# Patient Record
Sex: Female | Born: 1980 | Race: White | Hispanic: No | Smoking: Never smoker
Health system: Southern US, Community
[De-identification: ages and names within clinical notes are randomized; demographics above are authoritative.]

## PROBLEM LIST (undated history)

## (undated) DIAGNOSIS — N289 Disorder of kidney and ureter, unspecified: Secondary | ICD-10-CM

## (undated) DIAGNOSIS — F5104 Psychophysiologic insomnia: Secondary | ICD-10-CM

## (undated) DIAGNOSIS — K8020 Calculus of gallbladder without cholecystitis without obstruction: Secondary | ICD-10-CM

## (undated) DIAGNOSIS — Z5189 Encounter for other specified aftercare: Secondary | ICD-10-CM

## (undated) DIAGNOSIS — K219 Gastro-esophageal reflux disease without esophagitis: Secondary | ICD-10-CM

## (undated) DIAGNOSIS — G43909 Migraine, unspecified, not intractable, without status migrainosus: Secondary | ICD-10-CM

## (undated) DIAGNOSIS — K5909 Other constipation: Secondary | ICD-10-CM

## (undated) DIAGNOSIS — N390 Urinary tract infection, site not specified: Secondary | ICD-10-CM

## (undated) DIAGNOSIS — N159 Renal tubulo-interstitial disease, unspecified: Secondary | ICD-10-CM

## (undated) HISTORY — DX: Encounter for other specified aftercare: Z51.89

## (undated) HISTORY — DX: Migraine, unspecified, not intractable, without status migrainosus: G43.909

## (undated) HISTORY — PX: RENAL ARTERY STENT: SHX2321

## (undated) HISTORY — PX: EYE SURGERY: SHX253

## (undated) HISTORY — DX: Psychophysiologic insomnia: F51.04

---

## 2001-06-21 ENCOUNTER — Emergency Department (HOSPITAL_COMMUNITY): Admission: EM | Admit: 2001-06-21 | Discharge: 2001-06-21 | Payer: Self-pay | Admitting: *Deleted

## 2002-02-07 ENCOUNTER — Emergency Department (HOSPITAL_COMMUNITY): Admission: EM | Admit: 2002-02-07 | Discharge: 2002-02-07 | Payer: Self-pay | Admitting: *Deleted

## 2002-05-28 ENCOUNTER — Emergency Department (HOSPITAL_COMMUNITY): Admission: EM | Admit: 2002-05-28 | Discharge: 2002-05-28 | Payer: Self-pay | Admitting: Emergency Medicine

## 2002-07-14 ENCOUNTER — Ambulatory Visit (HOSPITAL_COMMUNITY): Admission: RE | Admit: 2002-07-14 | Discharge: 2002-07-14 | Payer: Self-pay | Admitting: Family Medicine

## 2002-07-14 ENCOUNTER — Encounter: Payer: Self-pay | Admitting: Family Medicine

## 2002-08-03 ENCOUNTER — Emergency Department (HOSPITAL_COMMUNITY): Admission: EM | Admit: 2002-08-03 | Discharge: 2002-08-03 | Payer: Self-pay | Admitting: Emergency Medicine

## 2003-02-12 ENCOUNTER — Emergency Department (HOSPITAL_COMMUNITY): Admission: EM | Admit: 2003-02-12 | Discharge: 2003-02-12 | Payer: Self-pay | Admitting: Internal Medicine

## 2003-02-20 ENCOUNTER — Ambulatory Visit (HOSPITAL_COMMUNITY): Admission: AD | Admit: 2003-02-20 | Discharge: 2003-02-20 | Payer: Self-pay | Admitting: Obstetrics and Gynecology

## 2003-02-21 ENCOUNTER — Observation Stay (HOSPITAL_COMMUNITY): Admission: AD | Admit: 2003-02-21 | Discharge: 2003-02-22 | Payer: Self-pay | Admitting: Internal Medicine

## 2003-03-23 ENCOUNTER — Observation Stay (HOSPITAL_COMMUNITY): Admission: AD | Admit: 2003-03-23 | Discharge: 2003-03-24 | Payer: Self-pay | Admitting: Obstetrics and Gynecology

## 2003-03-28 ENCOUNTER — Ambulatory Visit (HOSPITAL_COMMUNITY): Admission: AD | Admit: 2003-03-28 | Discharge: 2003-03-28 | Payer: Self-pay | Admitting: Obstetrics and Gynecology

## 2003-03-30 ENCOUNTER — Ambulatory Visit (HOSPITAL_COMMUNITY): Admission: AD | Admit: 2003-03-30 | Discharge: 2003-03-30 | Payer: Self-pay | Admitting: Obstetrics and Gynecology

## 2003-04-05 ENCOUNTER — Ambulatory Visit (HOSPITAL_COMMUNITY): Admission: AD | Admit: 2003-04-05 | Discharge: 2003-04-05 | Payer: Self-pay | Admitting: Obstetrics and Gynecology

## 2003-04-08 ENCOUNTER — Inpatient Hospital Stay (HOSPITAL_COMMUNITY): Admission: RE | Admit: 2003-04-08 | Discharge: 2003-04-09 | Payer: Self-pay | Admitting: Obstetrics and Gynecology

## 2003-05-07 ENCOUNTER — Emergency Department (HOSPITAL_COMMUNITY): Admission: EM | Admit: 2003-05-07 | Discharge: 2003-05-08 | Payer: Self-pay | Admitting: Internal Medicine

## 2004-11-02 ENCOUNTER — Ambulatory Visit (HOSPITAL_COMMUNITY): Admission: RE | Admit: 2004-11-02 | Discharge: 2004-11-02 | Payer: Self-pay | Admitting: Obstetrics & Gynecology

## 2004-11-07 ENCOUNTER — Emergency Department (HOSPITAL_COMMUNITY): Admission: EM | Admit: 2004-11-07 | Discharge: 2004-11-07 | Payer: Self-pay | Admitting: *Deleted

## 2004-11-21 ENCOUNTER — Other Ambulatory Visit: Admission: RE | Admit: 2004-11-21 | Discharge: 2004-11-21 | Payer: Self-pay | Admitting: Obstetrics and Gynecology

## 2005-01-11 ENCOUNTER — Other Ambulatory Visit: Admission: RE | Admit: 2005-01-11 | Discharge: 2005-01-11 | Payer: Self-pay | Admitting: Obstetrics and Gynecology

## 2005-02-11 ENCOUNTER — Emergency Department (HOSPITAL_COMMUNITY): Admission: EM | Admit: 2005-02-11 | Discharge: 2005-02-11 | Payer: Self-pay | Admitting: Emergency Medicine

## 2005-04-16 ENCOUNTER — Emergency Department (HOSPITAL_COMMUNITY): Admission: EM | Admit: 2005-04-16 | Discharge: 2005-04-16 | Payer: Self-pay | Admitting: Emergency Medicine

## 2005-05-20 ENCOUNTER — Emergency Department (HOSPITAL_COMMUNITY): Admission: EM | Admit: 2005-05-20 | Discharge: 2005-05-20 | Payer: Self-pay | Admitting: Emergency Medicine

## 2005-05-23 ENCOUNTER — Encounter (HOSPITAL_COMMUNITY): Admission: RE | Admit: 2005-05-23 | Discharge: 2005-06-22 | Payer: Self-pay | Admitting: Family Medicine

## 2005-05-23 ENCOUNTER — Ambulatory Visit (HOSPITAL_COMMUNITY): Admission: RE | Admit: 2005-05-23 | Discharge: 2005-05-23 | Payer: Self-pay | Admitting: Family Medicine

## 2005-06-24 ENCOUNTER — Ambulatory Visit (HOSPITAL_COMMUNITY): Admission: RE | Admit: 2005-06-24 | Discharge: 2005-06-24 | Payer: Self-pay | Admitting: Family Medicine

## 2005-07-02 ENCOUNTER — Emergency Department (HOSPITAL_COMMUNITY): Admission: EM | Admit: 2005-07-02 | Discharge: 2005-07-02 | Payer: Self-pay | Admitting: Emergency Medicine

## 2005-08-07 ENCOUNTER — Ambulatory Visit (HOSPITAL_COMMUNITY): Admission: RE | Admit: 2005-08-07 | Discharge: 2005-08-07 | Payer: Self-pay | Admitting: Family Medicine

## 2005-11-12 ENCOUNTER — Emergency Department (HOSPITAL_COMMUNITY): Admission: EM | Admit: 2005-11-12 | Discharge: 2005-11-12 | Payer: Self-pay | Admitting: Emergency Medicine

## 2006-02-24 ENCOUNTER — Emergency Department (HOSPITAL_COMMUNITY): Admission: EM | Admit: 2006-02-24 | Discharge: 2006-02-24 | Payer: Self-pay | Admitting: Emergency Medicine

## 2006-05-28 ENCOUNTER — Observation Stay (HOSPITAL_COMMUNITY): Admission: AD | Admit: 2006-05-28 | Discharge: 2006-05-29 | Payer: Self-pay | Admitting: Obstetrics and Gynecology

## 2006-06-10 ENCOUNTER — Ambulatory Visit (HOSPITAL_COMMUNITY): Admission: RE | Admit: 2006-06-10 | Discharge: 2006-06-10 | Payer: Self-pay | Admitting: Obstetrics and Gynecology

## 2006-06-17 ENCOUNTER — Ambulatory Visit (HOSPITAL_COMMUNITY): Admission: AD | Admit: 2006-06-17 | Discharge: 2006-06-18 | Payer: Self-pay | Admitting: Obstetrics and Gynecology

## 2006-06-22 ENCOUNTER — Observation Stay (HOSPITAL_COMMUNITY): Admission: AD | Admit: 2006-06-22 | Discharge: 2006-06-23 | Payer: Self-pay | Admitting: Obstetrics and Gynecology

## 2006-06-28 ENCOUNTER — Ambulatory Visit (HOSPITAL_COMMUNITY): Admission: AD | Admit: 2006-06-28 | Discharge: 2006-06-28 | Payer: Self-pay | Admitting: Obstetrics and Gynecology

## 2006-06-30 ENCOUNTER — Ambulatory Visit (HOSPITAL_COMMUNITY): Admission: AD | Admit: 2006-06-30 | Discharge: 2006-06-30 | Payer: Self-pay | Admitting: Obstetrics and Gynecology

## 2006-07-03 ENCOUNTER — Observation Stay (HOSPITAL_COMMUNITY): Admission: RE | Admit: 2006-07-03 | Discharge: 2006-07-03 | Payer: Self-pay | Admitting: Obstetrics and Gynecology

## 2006-07-11 ENCOUNTER — Ambulatory Visit (HOSPITAL_COMMUNITY): Admission: AD | Admit: 2006-07-11 | Discharge: 2006-07-11 | Payer: Self-pay | Admitting: Obstetrics and Gynecology

## 2006-07-13 ENCOUNTER — Inpatient Hospital Stay (HOSPITAL_COMMUNITY): Admission: AD | Admit: 2006-07-13 | Discharge: 2006-07-16 | Payer: Self-pay | Admitting: Obstetrics and Gynecology

## 2006-07-14 ENCOUNTER — Encounter (INDEPENDENT_AMBULATORY_CARE_PROVIDER_SITE_OTHER): Payer: Self-pay | Admitting: *Deleted

## 2008-06-30 ENCOUNTER — Other Ambulatory Visit: Admission: RE | Admit: 2008-06-30 | Discharge: 2008-06-30 | Payer: Self-pay | Admitting: Obstetrics and Gynecology

## 2008-07-31 ENCOUNTER — Emergency Department (HOSPITAL_COMMUNITY): Admission: EM | Admit: 2008-07-31 | Discharge: 2008-07-31 | Payer: Self-pay | Admitting: Emergency Medicine

## 2008-11-02 ENCOUNTER — Emergency Department (HOSPITAL_COMMUNITY): Admission: EM | Admit: 2008-11-02 | Discharge: 2008-11-03 | Payer: Self-pay | Admitting: Emergency Medicine

## 2008-12-29 ENCOUNTER — Emergency Department (HOSPITAL_COMMUNITY): Admission: EM | Admit: 2008-12-29 | Discharge: 2008-12-29 | Payer: Self-pay | Admitting: Emergency Medicine

## 2009-05-15 ENCOUNTER — Emergency Department (HOSPITAL_COMMUNITY): Admission: EM | Admit: 2009-05-15 | Discharge: 2009-05-15 | Payer: Self-pay | Admitting: Emergency Medicine

## 2009-10-29 ENCOUNTER — Emergency Department (HOSPITAL_COMMUNITY): Admission: EM | Admit: 2009-10-29 | Discharge: 2009-10-29 | Payer: Self-pay | Admitting: Emergency Medicine

## 2009-12-28 ENCOUNTER — Emergency Department (HOSPITAL_COMMUNITY): Admission: EM | Admit: 2009-12-28 | Discharge: 2009-12-28 | Payer: Self-pay | Admitting: Emergency Medicine

## 2010-02-21 ENCOUNTER — Other Ambulatory Visit: Admission: RE | Admit: 2010-02-21 | Discharge: 2010-02-21 | Payer: Self-pay | Admitting: Obstetrics and Gynecology

## 2010-03-22 ENCOUNTER — Emergency Department (HOSPITAL_COMMUNITY): Admission: EM | Admit: 2010-03-22 | Discharge: 2010-03-22 | Payer: Self-pay | Admitting: Emergency Medicine

## 2010-07-03 ENCOUNTER — Inpatient Hospital Stay (HOSPITAL_COMMUNITY): Admission: AD | Admit: 2010-07-03 | Discharge: 2010-07-03 | Payer: Self-pay | Admitting: Obstetrics & Gynecology

## 2010-07-03 ENCOUNTER — Ambulatory Visit: Payer: Self-pay | Admitting: Advanced Practice Midwife

## 2010-08-18 ENCOUNTER — Inpatient Hospital Stay (HOSPITAL_COMMUNITY): Admission: AD | Admit: 2010-08-18 | Discharge: 2010-08-18 | Payer: Self-pay | Admitting: Obstetrics and Gynecology

## 2010-08-18 ENCOUNTER — Ambulatory Visit: Payer: Self-pay | Admitting: Obstetrics and Gynecology

## 2010-08-23 ENCOUNTER — Ambulatory Visit: Payer: Self-pay | Admitting: Advanced Practice Midwife

## 2010-08-23 ENCOUNTER — Inpatient Hospital Stay (HOSPITAL_COMMUNITY): Admission: AD | Admit: 2010-08-23 | Discharge: 2010-08-24 | Payer: Self-pay | Admitting: Obstetrics & Gynecology

## 2010-08-25 ENCOUNTER — Inpatient Hospital Stay (HOSPITAL_COMMUNITY): Admission: AD | Admit: 2010-08-25 | Discharge: 2010-08-25 | Payer: Self-pay | Admitting: Family Medicine

## 2010-08-27 ENCOUNTER — Ambulatory Visit: Payer: Self-pay | Admitting: Family Medicine

## 2010-08-27 ENCOUNTER — Inpatient Hospital Stay (HOSPITAL_COMMUNITY): Admission: AD | Admit: 2010-08-27 | Discharge: 2010-09-01 | Payer: Self-pay | Admitting: Obstetrics & Gynecology

## 2010-08-27 HISTORY — PX: ABDOMINAL HYSTERECTOMY: SHX81

## 2010-08-28 ENCOUNTER — Encounter: Payer: Self-pay | Admitting: Obstetrics & Gynecology

## 2010-09-01 ENCOUNTER — Inpatient Hospital Stay (HOSPITAL_COMMUNITY): Admission: AD | Admit: 2010-09-01 | Discharge: 2010-09-01 | Payer: Self-pay | Admitting: Obstetrics & Gynecology

## 2010-09-07 ENCOUNTER — Inpatient Hospital Stay (HOSPITAL_COMMUNITY): Admission: AD | Admit: 2010-09-07 | Discharge: 2010-09-07 | Payer: Self-pay | Admitting: Obstetrics & Gynecology

## 2011-01-06 ENCOUNTER — Encounter: Payer: Self-pay | Admitting: Family Medicine

## 2011-01-06 ENCOUNTER — Encounter: Payer: Self-pay | Admitting: Obstetrics and Gynecology

## 2011-01-09 ENCOUNTER — Emergency Department (HOSPITAL_COMMUNITY)
Admission: EM | Admit: 2011-01-09 | Discharge: 2011-01-09 | Payer: Self-pay | Source: Home / Self Care | Admitting: Emergency Medicine

## 2011-01-09 LAB — COMPREHENSIVE METABOLIC PANEL
ALT: 12 U/L (ref 0–35)
AST: 16 U/L (ref 0–37)
Albumin: 4.3 g/dL (ref 3.5–5.2)
Alkaline Phosphatase: 82 U/L (ref 39–117)
BUN: 5 mg/dL — ABNORMAL LOW (ref 6–23)
CO2: 28 mEq/L (ref 19–32)
Calcium: 9.4 mg/dL (ref 8.4–10.5)
Chloride: 105 mEq/L (ref 96–112)
Creatinine, Ser: 1.25 mg/dL — ABNORMAL HIGH (ref 0.4–1.2)
GFR calc Af Amer: >60 mL/min (ref >60)
GFR calc non Af Amer: 50 mL/min — ABNORMAL LOW (ref >60)
Glucose, Bld: 95 mg/dL (ref 70–99)
Potassium: 3.9 mEq/L (ref 3.5–5.1)
Sodium: 140 mEq/L (ref 135–145)
Total Bilirubin: 0.4 mg/dL (ref 0.3–1.2)
Total Protein: 7.7 g/dL (ref 6.0–8.3)

## 2011-01-09 LAB — URINALYSIS, ROUTINE W REFLEX MICROSCOPIC
Hgb urine dipstick: NEGATIVE
Leukocytes, UA: NEGATIVE
Nitrite: POSITIVE — AB
Protein, ur: 30 mg/dL — AB
Specific Gravity, Urine: >1.03 — ABNORMAL HIGH (ref 1.005–1.030)
Urine Glucose, Fasting: 100 mg/dL — AB
Urobilinogen, UA: 4 mg/dL — ABNORMAL HIGH (ref 0.0–1.0)
pH: 5 (ref 5.0–8.0)

## 2011-01-09 LAB — DIFFERENTIAL
Basophils Absolute: 0 10*3/uL (ref 0.0–0.1)
Basophils Relative: 0 % (ref 0–1)
Eosinophils Absolute: 0.2 10*3/uL (ref 0.0–0.7)
Eosinophils Relative: 2 % (ref 0–5)
Lymphocytes Relative: 30 % (ref 12–46)
Lymphs Abs: 2.3 10*3/uL (ref 0.7–4.0)
Monocytes Absolute: 0.5 10*3/uL (ref 0.1–1.0)
Monocytes Relative: 7 % (ref 3–12)
Neutro Abs: 4.7 10*3/uL (ref 1.7–7.7)
Neutrophils Relative %: 61 % (ref 43–77)

## 2011-01-09 LAB — LIPASE, BLOOD: Lipase: 31 U/L (ref 11–59)

## 2011-01-09 LAB — CBC
HCT: 42.7 % (ref 36.0–46.0)
Hemoglobin: 14.9 g/dL (ref 12.0–15.0)
MCH: 31.7 pg (ref 26.0–34.0)
MCHC: 34.9 g/dL (ref 30.0–36.0)
MCV: 90.9 fL (ref 78.0–100.0)
Platelets: 219 10*3/uL (ref 150–400)
RBC: 4.7 MIL/uL (ref 3.87–5.11)
RDW: 12.4 % (ref 11.5–15.5)
WBC: 7.6 10*3/uL (ref 4.0–10.5)

## 2011-01-09 LAB — URINE MICROSCOPIC-ADD ON

## 2011-01-12 LAB — URINE CULTURE
Colony Count: >=100000
Culture  Setup Time: 201201261221

## 2011-02-28 LAB — BASIC METABOLIC PANEL
BUN: 6 mg/dL (ref 6–23)
BUN: 7 mg/dL (ref 6–23)
CO2: 30 mEq/L (ref 19–32)
Calcium: 7.4 mg/dL — ABNORMAL LOW (ref 8.4–10.5)
Chloride: 104 mEq/L (ref 96–112)
Chloride: 105 mEq/L (ref 96–112)
Creatinine, Ser: 1.14 mg/dL (ref 0.4–1.2)
Creatinine, Ser: 1.22 mg/dL — ABNORMAL HIGH (ref 0.4–1.2)
GFR calc Af Amer: >60 mL/min (ref >60)
GFR calc non Af Amer: 51 mL/min — ABNORMAL LOW (ref >60)
Glucose, Bld: 68 mg/dL — ABNORMAL LOW (ref 70–99)
Glucose, Bld: 71 mg/dL (ref 70–99)
Potassium: 4.2 mEq/L (ref 3.5–5.1)
Potassium: 4.2 mEq/L (ref 3.5–5.1)
Sodium: 138 mEq/L (ref 135–145)

## 2011-02-28 LAB — CBC
HCT: 23.3 % — ABNORMAL LOW (ref 36.0–46.0)
HCT: 24 % — ABNORMAL LOW (ref 36.0–46.0)
HCT: 24 % — ABNORMAL LOW (ref 36.0–46.0)
HCT: 28.6 % — ABNORMAL LOW (ref 36.0–46.0)
HCT: 28 % — ABNORMAL LOW (ref 36.0–46.0)
Hemoglobin: 8.3 g/dL — ABNORMAL LOW (ref 12.0–15.0)
Hemoglobin: 8.5 g/dL — ABNORMAL LOW (ref 12.0–15.0)
Hemoglobin: 11.1 g/dL — ABNORMAL LOW (ref 12.0–15.0)
Hemoglobin: 8.4 g/dL — ABNORMAL LOW (ref 12.0–15.0)
Hemoglobin: 9.5 g/dL — ABNORMAL LOW (ref 12.0–15.0)
MCH: 30.9 pg (ref 26.0–34.0)
MCH: 31.1 pg (ref 26.0–34.0)
MCH: 31.3 pg (ref 26.0–34.0)
MCH: 31.6 pg (ref 26.0–34.0)
MCH: 32.7 pg (ref 26.0–34.0)
MCH: 31.8 pg (ref 26.0–34.0)
MCH: 32.6 pg (ref 26.0–34.0)
MCHC: 34.4 g/dL (ref 30.0–36.0)
MCHC: 34.9 g/dL (ref 30.0–36.0)
MCHC: 35.3 g/dL (ref 30.0–36.0)
MCHC: 36.2 g/dL — ABNORMAL HIGH (ref 30.0–36.0)
MCHC: 33.9 g/dL (ref 30.0–36.0)
MCHC: 34.2 g/dL (ref 30.0–36.0)
MCV: 88.6 fL (ref 78.0–100.0)
MCV: 90.2 fL (ref 78.0–100.0)
MCV: 90.2 fL (ref 78.0–100.0)
MCV: 90.4 fL (ref 78.0–100.0)
MCV: 100.1 fL — ABNORMAL HIGH (ref 78.0–100.0)
MCV: 92.2 fL (ref 78.0–100.0)
MCV: 93 fL (ref 78.0–100.0)
Platelets: 112 10*3/uL — ABNORMAL LOW (ref 150–400)
Platelets: 77 10*3/uL — ABNORMAL LOW (ref 150–400)
Platelets: 79 10*3/uL — ABNORMAL LOW (ref 150–400)
Platelets: 89 10*3/uL — ABNORMAL LOW (ref 150–400)
Platelets: 100 10*3/uL — ABNORMAL LOW (ref 150–400)
Platelets: 105 10*3/uL — ABNORMAL LOW (ref 150–400)
Platelets: 115 10*3/uL — ABNORMAL LOW (ref 150–400)
Platelets: 175 10*3/uL (ref 150–400)
RBC: 2.66 MIL/uL — ABNORMAL LOW (ref 3.87–5.11)
RBC: 2.71 MIL/uL — ABNORMAL LOW (ref 3.87–5.11)
RBC: 2.11 MIL/uL — ABNORMAL LOW (ref 3.87–5.11)
RBC: 2.59 MIL/uL — ABNORMAL LOW (ref 3.87–5.11)
RBC: 3.66 MIL/uL — ABNORMAL LOW (ref 3.87–5.11)
RDW: 16.1 % — ABNORMAL HIGH (ref 11.5–15.5)
RDW: 16.3 % — ABNORMAL HIGH (ref 11.5–15.5)
RDW: 16.6 % — ABNORMAL HIGH (ref 11.5–15.5)
RDW: 16.8 % — ABNORMAL HIGH (ref 11.5–15.5)
RDW: 17 % — ABNORMAL HIGH (ref 11.5–15.5)
RDW: 14.1 % (ref 11.5–15.5)
RDW: 15 % (ref 11.5–15.5)
WBC: 13.1 10*3/uL — ABNORMAL HIGH (ref 4.0–10.5)
WBC: 8.3 10*3/uL (ref 4.0–10.5)
WBC: 9.1 10*3/uL (ref 4.0–10.5)
WBC: 10.1 10*3/uL (ref 4.0–10.5)
WBC: 8 10*3/uL (ref 4.0–10.5)
WBC: 8.8 10*3/uL (ref 4.0–10.5)

## 2011-02-28 LAB — CROSSMATCH: Antibody Screen: NEGATIVE

## 2011-02-28 LAB — COMPREHENSIVE METABOLIC PANEL
ALT: 12 U/L (ref 0–35)
AST: 24 U/L (ref 0–37)
AST: 25 U/L (ref 0–37)
Albumin: 1.5 g/dL — ABNORMAL LOW (ref 3.5–5.2)
Alkaline Phosphatase: 41 U/L (ref 39–117)
BUN: 6 mg/dL (ref 6–23)
BUN: 1 mg/dL — ABNORMAL LOW (ref 6–23)
CO2: 30 mEq/L (ref 19–32)
CO2: 24 mEq/L (ref 19–32)
Calcium: 7.4 mg/dL — ABNORMAL LOW (ref 8.4–10.5)
Calcium: 7.1 mg/dL — ABNORMAL LOW (ref 8.4–10.5)
Chloride: 103 mEq/L (ref 96–112)
Chloride: 107 mEq/L (ref 96–112)
Creatinine, Ser: 1.14 mg/dL (ref 0.4–1.2)
Creatinine, Ser: 0.97 mg/dL (ref 0.4–1.2)
GFR calc Af Amer: >60 mL/min (ref >60)
GFR calc Af Amer: >60 mL/min (ref >60)
GFR calc non Af Amer: 56 mL/min — ABNORMAL LOW (ref >60)
GFR calc non Af Amer: >60 mL/min (ref >60)
Glucose, Bld: 83 mg/dL (ref 70–99)
Potassium: 3.7 mEq/L (ref 3.5–5.1)
Sodium: 137 mEq/L (ref 135–145)
Total Bilirubin: 0.6 mg/dL (ref 0.3–1.2)
Total Bilirubin: 1.3 mg/dL — ABNORMAL HIGH (ref 0.3–1.2)
Total Protein: 3 g/dL — ABNORMAL LOW (ref 6.0–8.3)

## 2011-02-28 LAB — URINALYSIS, ROUTINE W REFLEX MICROSCOPIC
Glucose, UA: NEGATIVE mg/dL
Ketones, ur: NEGATIVE mg/dL
Ketones, ur: NEGATIVE mg/dL
Leukocytes, UA: NEGATIVE
Nitrite: NEGATIVE
Nitrite: NEGATIVE
Nitrite: NEGATIVE
Protein, ur: 30 mg/dL — AB
Protein, ur: NEGATIVE mg/dL
Specific Gravity, Urine: <1.005 — ABNORMAL LOW (ref 1.005–1.030)
Urobilinogen, UA: 1 mg/dL (ref 0.0–1.0)
Urobilinogen, UA: 0.2 mg/dL (ref 0.0–1.0)
pH: 6 (ref 5.0–8.0)

## 2011-02-28 LAB — WET PREP, GENITAL: Clue Cells Wet Prep HPF POC: NONE SEEN

## 2011-02-28 LAB — DIC (DISSEMINATED INTRAVASCULAR COAGULATION)PANEL
D-Dimer, Quant: >20 ug/mL-FEU — ABNORMAL HIGH (ref 0.00–0.48)
D-Dimer, Quant: >20 ug/mL-FEU — ABNORMAL HIGH (ref 0.00–0.48)
Fibrinogen: 120 mg/dL — ABNORMAL LOW (ref 204–475)
INR: 1.67 — ABNORMAL HIGH (ref 0.00–1.49)
Platelets: 127 10*3/uL — ABNORMAL LOW (ref 150–400)
Platelets: 156 10*3/uL (ref 150–400)
Smear Review: NONE SEEN
Smear Review: NONE SEEN
Smear Review: NONE SEEN
aPTT: 38 seconds — ABNORMAL HIGH (ref 24–37)

## 2011-02-28 LAB — PREPARE FRESH FROZEN PLASMA

## 2011-02-28 LAB — PREPARE PLATELETS

## 2011-02-28 LAB — PREPARE RBC (CROSSMATCH)

## 2011-02-28 LAB — CULTURE, ROUTINE-ABSCESS

## 2011-02-28 LAB — RPR: RPR Ser Ql: NONREACTIVE

## 2011-03-02 LAB — CBC
MCH: 34.2 pg — ABNORMAL HIGH (ref 26.0–34.0)
MCHC: 34.2 g/dL (ref 30.0–36.0)
MCV: 99.9 fL (ref 78.0–100.0)
Platelets: 152 10*3/uL (ref 150–400)
RBC: 3.17 MIL/uL — ABNORMAL LOW (ref 3.87–5.11)

## 2011-03-02 LAB — URINALYSIS, ROUTINE W REFLEX MICROSCOPIC
Nitrite: NEGATIVE
Protein, ur: NEGATIVE mg/dL
Specific Gravity, Urine: 1.01 (ref 1.005–1.030)
Urobilinogen, UA: 2 mg/dL — ABNORMAL HIGH (ref 0.0–1.0)

## 2011-03-02 LAB — COMPREHENSIVE METABOLIC PANEL
Albumin: 2.5 g/dL — ABNORMAL LOW (ref 3.5–5.2)
BUN: 4 mg/dL — ABNORMAL LOW (ref 6–23)
CO2: 22 mEq/L (ref 19–32)
Calcium: 8.3 mg/dL — ABNORMAL LOW (ref 8.4–10.5)
Chloride: 106 mEq/L (ref 96–112)
Creatinine, Ser: 0.83 mg/dL (ref 0.4–1.2)
GFR calc non Af Amer: >60 mL/min (ref >60)
Total Bilirubin: 0.7 mg/dL (ref 0.3–1.2)

## 2011-03-02 LAB — WET PREP, GENITAL
Clue Cells Wet Prep HPF POC: NONE SEEN
Trich, Wet Prep: NONE SEEN
Yeast Wet Prep HPF POC: NONE SEEN

## 2011-03-06 LAB — HEPATIC FUNCTION PANEL
Alkaline Phosphatase: 45 U/L (ref 39–117)
Bilirubin, Direct: 0.2 mg/dL (ref 0.0–0.3)
Indirect Bilirubin: 0.7 mg/dL (ref 0.3–0.9)
Total Bilirubin: 0.9 mg/dL (ref 0.3–1.2)
Total Protein: 6 g/dL (ref 6.0–8.3)

## 2011-03-06 LAB — CBC
HCT: 36.5 % (ref 36.0–46.0)
MCV: 95.3 fL (ref 78.0–100.0)
Platelets: 154 10*3/uL (ref 150–400)
WBC: 8.9 10*3/uL (ref 4.0–10.5)

## 2011-03-06 LAB — URINE MICROSCOPIC-ADD ON

## 2011-03-06 LAB — URINE CULTURE

## 2011-03-06 LAB — DIFFERENTIAL
Eosinophils Absolute: 0 10*3/uL (ref 0.0–0.7)
Eosinophils Relative: 0 % (ref 0–5)
Lymphs Abs: 0.8 10*3/uL (ref 0.7–4.0)
Monocytes Relative: 3 % (ref 3–12)

## 2011-03-06 LAB — URINALYSIS, ROUTINE W REFLEX MICROSCOPIC
Glucose, UA: NEGATIVE mg/dL
Protein, ur: NEGATIVE mg/dL
Specific Gravity, Urine: 1.02 (ref 1.005–1.030)
pH: 5.5 (ref 5.0–8.0)

## 2011-03-06 LAB — LIPASE, BLOOD: Lipase: 25 U/L (ref 11–59)

## 2011-03-06 LAB — BASIC METABOLIC PANEL
BUN: 8 mg/dL (ref 6–23)
Chloride: 106 mEq/L (ref 96–112)
Potassium: 4.1 mEq/L (ref 3.5–5.1)

## 2011-03-20 LAB — DIFFERENTIAL
Basophils Absolute: 0 10*3/uL (ref 0.0–0.1)
Eosinophils Absolute: 0.2 10*3/uL (ref 0.0–0.7)
Lymphs Abs: 2 10*3/uL (ref 0.7–4.0)
Neutrophils Relative %: 54 % (ref 43–77)

## 2011-03-20 LAB — COMPREHENSIVE METABOLIC PANEL
ALT: 43 U/L — ABNORMAL HIGH (ref 0–35)
CO2: 27 mEq/L (ref 19–32)
Calcium: 9.1 mg/dL (ref 8.4–10.5)
Chloride: 104 mEq/L (ref 96–112)
Creatinine, Ser: 1.23 mg/dL — ABNORMAL HIGH (ref 0.4–1.2)
GFR calc non Af Amer: 51 mL/min — ABNORMAL LOW (ref >60)
Glucose, Bld: 91 mg/dL (ref 70–99)
Sodium: 139 mEq/L (ref 135–145)
Total Bilirubin: 0.3 mg/dL (ref 0.3–1.2)

## 2011-03-20 LAB — URINALYSIS, ROUTINE W REFLEX MICROSCOPIC
Bilirubin Urine: NEGATIVE
Hgb urine dipstick: NEGATIVE
Ketones, ur: NEGATIVE mg/dL
Protein, ur: NEGATIVE mg/dL
Urobilinogen, UA: 0.2 mg/dL (ref 0.0–1.0)

## 2011-03-20 LAB — CBC
Hemoglobin: 14.2 g/dL (ref 12.0–15.0)
MCHC: 34.1 g/dL (ref 30.0–36.0)
MCV: 94.2 fL (ref 78.0–100.0)
RBC: 4.42 MIL/uL (ref 3.87–5.11)

## 2011-03-20 LAB — URINE CULTURE

## 2011-05-03 NOTE — Op Note (Signed)
   NAME:  Heather Moon                        ACCOUNT NO.:  [id number]   MEDICAL RECORD NO.:  [id number]                   PATIENT TYPE:  OBV   LOCATION:  A426                                 FACILITY:  [facility]   PHYSICIAN:  Tilda Burrow, M.D.              DATE OF BIRTH:  17-Sep-1981   DATE OF PROCEDURE:  DATE OF DISCHARGE:                                 OPERATIVE REPORT   DELIVERY NOTE:  I was called to attend her delivery at approximately 15:00  due to an irresistible urge to push with an 8-9 cm cervix. It was easily  reduced and she was fully dilated at approximately 15:15. The baby delivered  spontaneously at 15:31. The mouth and nose were suctioned on the perineum  and the baby delivered easily after that with the mother assisting in the  delivery after the shoulders came out. Weight is 7 pounds 5 ounces, Apgar 9  & 9. The infant is a female. The placenta separated spontaneously and  delivered at 15:37, 20 units of Pitocin was diluted in a 1000 mL of lactated  Ringer's and infused rapidly IV. The fundus was firm and minimal blood loss  was noted. The vagina was then inspected and noted to be intact. The  placenta was also inspected and appears to be intact with a three vessel  cord. Estimated blood loss was 300 mL. The patient tolerated the delivery  very well.     Zenovia Jordan, P.A.                      Tilda Burrow, M.D.    RRK/MEDQ  D:  04/08/2003  T:  04/09/2003  Job:  [id number]

## 2011-05-03 NOTE — H&P (Signed)
   NAME:  Heather Moon                        ACCOUNT NO.:  [id number]   MEDICAL RECORD NO.:  [id number]                   PATIENT TYPE:  OBV   LOCATION:  LDR1                                 FACILITY:  [facility]   PHYSICIAN:  Tilda Burrow, M.D.              DATE OF BIRTH:  09-27-1981   DATE OF ADMISSION:  04/08/2003  DATE OF DISCHARGE:                                HISTORY & PHYSICAL   HISTORY OF PRESENT ILLNESS:  The patient is a gravida 3, para 2, that is 38  weeks and 4 days, who came into the hospital early this morning with  irregular contractions.  Cervix is now 3 cm, about 50% effaced and -2  station.   PAST MEDICAL HISTORY:  Medical history is negative.   PAST SURGICAL HISTORY:  Surgical history is negative.   PRENATAL COURSE:  Prenatal course essentially uneventful.  Blood type is A-  positive.  Antibody screen is negative.  VDRL is nonreactive.  UDS is  negative.  Hepatitis B surface antigen negative.  HIV is negative.  GC and  Chlamydia are both negative.  GBS is negative.   PLAN:  We are going to admit and Cytotec-induce and expect vaginal delivery.     Zerita Boers, Reita Cliche, M.D.    DL/MEDQ  D:  19/14/7829  T:  04/08/2003  Job:  [id number]   cc:   [facility]

## 2011-05-03 NOTE — H&P (Signed)
[name], [name] NO.:  [id number]   MEDICAL RECORD NO.:  [id number]          PATIENT TYPE:  OIB   LOCATION:  LDR1                          FACILITY:  [facility]   PHYSICIAN:  Tilda Burrow, M.D. DATE OF BIRTH:  1981-01-10   DATE OF ADMISSION:  06/10/2006  DATE OF DISCHARGE:  06/26/2007LH                                HISTORY & PHYSICAL   SUMMARY:  Heather Moon came in the evening of June 10, 2006 with complaints of  having been in the food line, was squatting down to pick something up and  __________  of her bladder.  She did want to come in to make sure that it  was not her amniotic fluid leaking.  She states that she had a full bladder  at the time and had the sensation of having voided.  Amnio swab was  negative.  She is not leaking any fluid at the present time.  Her cervix is  fingertip dilated, still very thick, -2 station and no loss of fluid with  ________ of the head.   IMPRESSION:  1.  Loss of  bladder control.  2.  Thirty-five-week-4-day gestation.      Jacklyn Shell, C.N.M.      Tilda Burrow, M.D.  Electronically Signed    FC/MEDQ  D:  06/13/2006  T:  06/13/2006  Job:  [id number]   cc:   [facility]

## 2011-05-03 NOTE — Op Note (Signed)
Heather Moon, SINATRA NO.:  [id number]   MEDICAL RECORD NO.:  [id number]          PATIENT TYPE:  INP   LOCATION:  A413                          FACILITY:  [facility]   PHYSICIAN:  Tilda Burrow, M.D. DATE OF BIRTH:  March 05, 1981   DATE OF PROCEDURE:  07/14/2006  DATE OF DISCHARGE:                                 OPERATIVE REPORT   PROCEDURE:  Epidural catheter placement.  Continuous lumbar epidural catheter placed at 11:30 a.m. using loss of  resistance technique after prepping and draping in the back with the patient  in sitting position, flex forward, with easy insertion of the epidural  catheter once the epidural space was identified at a depth of approximately  4.5 cm.  The 5 mL test dose of 1.5% lidocaine with epinephrine was instilled  easily, the catheter inserted easily 3 cm into the epidural space, taped to  the back once the Tuohy needle was removed, and continuous infusion  initiated.  A bolus of 7 mL was given followed by 14 _mL  per hour__.  The  patient tolerated the procedure well, began to get symmetric analgesia  effect at approximately T12.  Block is not completely set at this time.  The  cervix is 1 cm, 100% -2, very posterior.  The patient is tolerating labor  well.  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      Tilda Burrow, M.D.  Electronically Signed     JVF/MEDQ  D:  07/14/2006  T:  07/14/2006  Job:  [id number]

## 2011-05-03 NOTE — H&P (Signed)
Heather Moon, SIMI NO.:  [id number]   MEDICAL RECORD NO.:  [id number]          PATIENT TYPE:  INP   LOCATION:  LDR3                          FACILITY:  [facility]   PHYSICIAN:  Lazaro Arms, M.D.   DATE OF BIRTH:  1981/08/16   DATE OF ADMISSION:  07/13/2006  DATE OF DISCHARGE:  LH                                HISTORY & PHYSICAL   Grenda is a 30 year old white female gravida 4, para 3, estimated date of  delivery July 12, 2006, currently at 40-2/7ths weeks gestation admitted for  cervical ripening and induction of labor for post-dates.   PAST MEDICAL HISTORY:  Significant for urinary tract infections.   PAST SURGICAL HISTORY:  Negative.   PAST OB:  She has had 3 vaginal deliveries; 2000, 2002 and 2004.   Blood type is A positive.  Rubella is immune.  Hepatitis B is negative.  HIV  is nonreactive.  Serology is nonreactive.  Pap was normal.  Group B strep  was negative.  Glucola was normal.  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   HEENT:  Unremarkable.  NECK:  Thyroid is normal.  LUNGS:  Clear.  HEART:  Regular rhythm without murmur, rub or gallop.  BREASTS:  Deferred.  ABDOMEN:  Fundal height 37 cm.  PELVIC:  Cervix is 1, 75% and -2, vertex, soft and posterior.   IMPRESSION:  1.  Intrauterine pregnancy at 40-2/7ths weeks gestation.  2.  Unfavorable cervix.   PLAN:  Patient is admitted for cervical ripening and induction of labor in  anticipation of NSVD.      Lazaro Arms, M.D.  Electronically Signed     LHE/MEDQ  D:  07/14/2006  T:  07/14/2006  Job:  [id number]

## 2011-05-03 NOTE — Op Note (Signed)
NAMEELA, MOFFAT NO.:  [id number]   MEDICAL RECORD NO.:  [id number]          PATIENT TYPE:  INP   LOCATION:  LDR2                          FACILITY:  [facility]   PHYSICIAN:  Tilda Burrow, M.D. DATE OF BIRTH:  [birth year]   DATE OF PROCEDURE:  07/14/2006  DATE OF DISCHARGE:                                 OPERATIVE REPORT   DATE AND TIME OF DELIVERY:  July 14, 2006, at 1522.   LENGTH OF FIRST STAGE LABOR:  9 hours and 23 minutes.   LENGTH OF SECOND STAGE LABOR:  59 minutes.   LENGTH OF THIRD STAGE LABOR:  50 minutes.   DELIVERY NOTE:  Vacuum Assisted Vag Delivery DL/ Manual Removal of placenta-  jvf  Luanne had a vacuum-assisted Kiwi delivery at +2 station for persistent OP  With prolonged variable decerations.  Dr. Emelda Fear was notified that vacuum  was being applied and en route from office.  Vacuum was assisted x4 uterine  contractions for approximately 30 seconds.  Kiwi was pumped to the green  marker.  Infant was delivered _with presenting part  occiput posterior__(  OP).  Upon delivery of head, there was a nuchal cord noted which was easily  loosened and flipped over the shoulder without difficulty.  Apgars were 9  and 9.  Infant weight was 8 pounds 12 ounces.  Perineum is noted to be  intact.  Cord blood gas and cord blood were obtained.  Third stage of labor  was actively managed with 20 units Pitocin, 1000 mL D5 lactated Ringers at a  rapid rate.  Retained placenta is noted.  Dr. Emelda Fear was summoned after 30  minutes of inability to deliver placenta, and placenta was delivered with  the assistance of ring tipped forceps per Dr. Emelda Fear.  Placenta was sent  to pathology for evaluation.  Estimated blood loss was approximately 1800  mL.  Contents of delivery drain bag was approximately 3 pounds0.9 ounces.  Infant  was stabilized and handed off to the newborn nurses for newborn care without  any complications.  Methergine 0.2 mg IM utilized to  facilitate uterine  tone.  The patient was stabilized with Foley intact.  At the postpartum  unit, epidural catheter was removed with blue tip intact.      Zerita Boers, Lanier Clam      Tilda Burrow, M.D.  Electronically Signed    DL/MEDQ  D:  04/54/0981  T:  07/14/2006  Job:  [id number]   cc:   Lorin Picket A. Gerda Diss, MD  Fax: [phone]

## 2011-05-03 NOTE — Assessment & Plan Note (Signed)
NAME:  Heather Moon, Heather Moon NO.:  [id number]   MEDICAL RECORD NO.:  [id number]          PATIENT TYPE:  OIB   LOCATION:  LDR3                          FACILITY:  [facility]   PHYSICIAN:  Tilda Burrow, M.D. DATE OF BIRTH:  June 13, 1981   DATE OF PROCEDURE:  DATE OF DISCHARGE:                                 [facility]   HISTORY OF PRESENT ILLNESS:  Heather Moon is a 30 year old, gravida 4, para 3,  who is at term, who comes in complaining of pressure and cramping.   PAST MEDICAL HISTORY:  Positive for abnormal Pap and frequent UTIs.   PAST SURGICAL HISTORY:  Positive for kidney surgery.   ALLERGIES:  She has no known allergies.   PRENATAL COURSE:  Uneventful.  Blood type is A positive.  UDS negative.  Rubella is immune.  Hepatitis-B surface antigen is negative.  HIV is  negative.  Serologies nonreactive.  Pap is normal.  GBS is negative.   PHYSICAL EXAMINATION:  VITAL SIGNS:  Stable.  Fetal heart pattern is  reactive.  CERVIX:  Essentially closed, firm, midposition.   PLAN:  We are going to discharge home to follow up Monday in the office for  routine appointment or present back here with regular uterine contractions  and/or ruptured membranes.      Zerita Boers, Lanier Clam      Tilda Burrow, M.D.  Electronically Signed    DL/MEDQ  D:  16/09/9603  T:  07/03/2006  Job:  [id number]

## 2011-05-30 ENCOUNTER — Emergency Department (HOSPITAL_COMMUNITY)
Admission: EM | Admit: 2011-05-30 | Discharge: 2011-05-30 | Disposition: A | Discharge status: Home / Self Care | Payer: Medicaid Other | Attending: Emergency Medicine | Admitting: Emergency Medicine

## 2011-05-30 DIAGNOSIS — Z9079 Acquired absence of other genital organ(s): Secondary | ICD-10-CM | POA: Insufficient documentation

## 2011-05-30 DIAGNOSIS — R109 Unspecified abdominal pain: Secondary | ICD-10-CM | POA: Insufficient documentation

## 2011-05-30 DIAGNOSIS — N390 Urinary tract infection, site not specified: Secondary | ICD-10-CM | POA: Insufficient documentation

## 2011-05-30 LAB — URINALYSIS, ROUTINE W REFLEX MICROSCOPIC
Bilirubin Urine: NEGATIVE
Hgb urine dipstick: NEGATIVE
Ketones, ur: NEGATIVE mg/dL
Urobilinogen, UA: 1 mg/dL (ref 0.0–1.0)

## 2011-05-30 LAB — URINE MICROSCOPIC-ADD ON

## 2011-08-02 ENCOUNTER — Encounter: Payer: Self-pay | Admitting: *Deleted

## 2011-08-02 ENCOUNTER — Emergency Department (HOSPITAL_COMMUNITY)
Admission: EM | Admit: 2011-08-02 | Discharge: 2011-08-02 | Disposition: A | Discharge status: Home / Self Care | Payer: Medicaid Other | Attending: Emergency Medicine | Admitting: Emergency Medicine

## 2011-08-02 DIAGNOSIS — N390 Urinary tract infection, site not specified: Secondary | ICD-10-CM | POA: Insufficient documentation

## 2011-08-02 DIAGNOSIS — M549 Dorsalgia, unspecified: Secondary | ICD-10-CM | POA: Insufficient documentation

## 2011-08-02 LAB — URINALYSIS, ROUTINE W REFLEX MICROSCOPIC
Glucose, UA: NEGATIVE mg/dL
Specific Gravity, Urine: 1.01 (ref 1.005–1.030)
pH: 6 (ref 5.0–8.0)

## 2011-08-02 LAB — URINE MICROSCOPIC-ADD ON

## 2011-08-02 MED ORDER — ONDANSETRON HCL 4 MG PO TABS
4.0000 mg | ORAL_TABLET | Freq: Once | ORAL | Status: AC
  Administered 2011-08-02: 4 mg via ORAL
  Filled 2011-08-02: qty 1

## 2011-08-02 MED ORDER — IBUPROFEN 800 MG PO TABS
800.0000 mg | ORAL_TABLET | Freq: Once | ORAL | Status: AC
  Administered 2011-08-02: 800 mg via ORAL
  Filled 2011-08-02: qty 1

## 2011-08-02 MED ORDER — HYDROCODONE-ACETAMINOPHEN 5-325 MG PO TABS
1.0000 | ORAL_TABLET | Freq: Once | ORAL | Status: AC
  Administered 2011-08-02: 1 via ORAL
  Filled 2011-08-02: qty 1

## 2011-08-02 MED ORDER — CIPROFLOXACIN HCL 250 MG PO TABS
500.0000 mg | ORAL_TABLET | Freq: Once | ORAL | Status: AC
  Administered 2011-08-02: 500 mg via ORAL
  Filled 2011-08-02: qty 2

## 2011-08-02 MED ORDER — HYDROCODONE-ACETAMINOPHEN 5-325 MG PO TABS: 1.0000 | ORAL_TABLET | ORAL | Status: AC | PRN

## 2011-08-02 MED ORDER — CIPROFLOXACIN HCL 500 MG PO TABS: 500.0000 mg | ORAL_TABLET | Freq: Two times a day (BID) | ORAL | Status: AC

## 2011-08-02 NOTE — ED Notes (Signed)
Confirmed with Laural Roes., RN that urine specimen was sent to lab, awaiting for results

## 2011-08-02 NOTE — ED Notes (Signed)
Patient requesting pain med, explained to patient that EDP will have to assess her and only EDP can order pain meds, warm blanket provided per request

## 2011-08-02 NOTE — ED Provider Notes (Signed)
History     CSN: [id number] Arrival date & time: 08/02/2011  9:09 PM  Chief Complaint  Patient presents with  . Back Pain  . Urinary Frequency  . Abdominal Pain  . Fever  . Chills   HPI Comments: Seen 2  Patient is a 30 y.o. female presenting with back pain, frequency, abdominal pain, and fever. The history is provided by the patient.  Back Pain  This is a recurrent problem. Episode onset: back pain intermittant since delivery of child 11  months ago. The problem has not changed since onset.The pain is present in the thoracic spine and lumbar spine. The quality of the pain is described as aching. The pain is at a severity of 7/10. The pain is moderate. Associated symptoms include a fever and abdominal pain. Pertinent negatives include no headaches, no dysuria and no pelvic pain. She has tried nothing for the symptoms.  Urinary Frequency Associated symptoms include abdominal pain. Pertinent negatives include no headaches.  Abdominal Pain The primary symptoms of the illness include abdominal pain and fever. The primary symptoms of the illness do not include dysuria.  Additional symptoms associated with the illness include frequency and back pain.  Fever Primary symptoms of the febrile illness include fever and abdominal pain. Primary symptoms do not include headaches or dysuria.    History reviewed. No pertinent past medical history.  Past Surgical History  Procedure Date  . Abdominal hysterectomy     No family history on file.  History  Substance Use Topics  . Smoking status: Never Smoker   . Smokeless tobacco: Not on file  . Alcohol Use: No    OB History    Grav Para Term Preterm Abortions TAB SAB Ect Mult Living                  Review of Systems  Constitutional: Positive for fever.  Gastrointestinal: Positive for abdominal pain.  Genitourinary: Positive for frequency. Negative for dysuria and pelvic pain.  Musculoskeletal: Positive for back pain.    Neurological: Negative for headaches.  All other systems reviewed and are negative.    Physical Exam  BP 125/81  Pulse 84  Temp 99.9 F (37.7 C)  Resp 20  Ht 5\' 4"  (1.626 m)  Wt 125 lb (56.7 kg)  BMI 21.46 kg/m2  SpO2 100%  Physical Exam  Constitutional: She is oriented to person, place, and time. She appears well-developed and well-nourished.  HENT:  Head: Normocephalic.  Left Ear: External ear normal.  Nose: Nose normal.  Mouth/Throat: Oropharynx is clear and moist.  Eyes: EOM are normal.  Neck: Normal range of motion.  Cardiovascular: Normal rate, normal heart sounds and intact distal pulses.   Pulmonary/Chest: Effort normal.  Abdominal: Soft.  Musculoskeletal: Normal range of motion.  Neurological: She is alert and oriented to person, place, and time.  Skin: Skin is warm and dry.  Psychiatric: She has a normal mood and affect. Her behavior is normal.    ED Course  Procedures  MDM MDM Reviewed: nursing note and vitals Reviewed previous: labs Interpretation: labs        Nicoletta Dress. Colon Branch, MD 08/02/11 2316

## 2011-08-02 NOTE — ED Notes (Signed)
Pt c/o lower back pain, urinary frequency, dysuria, epigastric pain, fever, and chills.

## 2011-09-17 LAB — URINALYSIS, ROUTINE W REFLEX MICROSCOPIC
Bilirubin Urine: NEGATIVE
Hgb urine dipstick: NEGATIVE
Ketones, ur: NEGATIVE
Nitrite: NEGATIVE
Protein, ur: 30 — AB
Specific Gravity, Urine: 1.015
Urobilinogen, UA: 2 — ABNORMAL HIGH

## 2011-09-17 LAB — WET PREP, GENITAL
Clue Cells Wet Prep HPF POC: NONE SEEN
Trich, Wet Prep: NONE SEEN
Yeast Wet Prep HPF POC: NONE SEEN

## 2011-09-17 LAB — URINE MICROSCOPIC-ADD ON

## 2011-09-17 LAB — GC/CHLAMYDIA PROBE AMP, GENITAL
Chlamydia, DNA Probe: NEGATIVE
GC Probe Amp, Genital: NEGATIVE

## 2011-09-17 LAB — POCT PREGNANCY, URINE: Preg Test, Ur: NEGATIVE

## 2011-09-17 LAB — HCG, SERUM, QUALITATIVE: Preg, Serum: NEGATIVE

## 2011-10-03 ENCOUNTER — Emergency Department (HOSPITAL_COMMUNITY)
Admission: EM | Admit: 2011-10-03 | Discharge: 2011-10-03 | Disposition: A | Discharge status: Home / Self Care | Payer: Medicaid Other | Attending: Emergency Medicine | Admitting: Emergency Medicine

## 2011-10-03 DIAGNOSIS — M545 Low back pain, unspecified: Secondary | ICD-10-CM | POA: Insufficient documentation

## 2011-10-03 DIAGNOSIS — R3915 Urgency of urination: Secondary | ICD-10-CM | POA: Insufficient documentation

## 2011-10-03 DIAGNOSIS — N390 Urinary tract infection, site not specified: Secondary | ICD-10-CM | POA: Insufficient documentation

## 2011-10-03 DIAGNOSIS — R300 Dysuria: Secondary | ICD-10-CM | POA: Insufficient documentation

## 2011-10-03 DIAGNOSIS — K089 Disorder of teeth and supporting structures, unspecified: Secondary | ICD-10-CM | POA: Insufficient documentation

## 2011-10-03 LAB — URINALYSIS, ROUTINE W REFLEX MICROSCOPIC
Glucose, UA: NEGATIVE mg/dL
Hgb urine dipstick: NEGATIVE
Ketones, ur: NEGATIVE mg/dL
Protein, ur: NEGATIVE mg/dL
Urobilinogen, UA: 1 mg/dL (ref 0.0–1.0)

## 2011-10-03 LAB — URINE MICROSCOPIC-ADD ON

## 2011-10-05 LAB — URINE CULTURE: Colony Count: >=100000

## 2012-02-03 ENCOUNTER — Emergency Department (HOSPITAL_COMMUNITY)
Admission: EM | Admit: 2012-02-03 | Discharge: 2012-02-03 | Disposition: A | Discharge status: Home Health | Payer: Medicaid Other | Attending: Emergency Medicine | Admitting: Emergency Medicine

## 2012-02-03 ENCOUNTER — Encounter (HOSPITAL_COMMUNITY): Payer: Self-pay | Admitting: Emergency Medicine

## 2012-02-03 DIAGNOSIS — J3489 Other specified disorders of nose and nasal sinuses: Secondary | ICD-10-CM | POA: Insufficient documentation

## 2012-02-03 DIAGNOSIS — R0982 Postnasal drip: Secondary | ICD-10-CM | POA: Insufficient documentation

## 2012-02-03 DIAGNOSIS — R05 Cough: Secondary | ICD-10-CM | POA: Insufficient documentation

## 2012-02-03 DIAGNOSIS — R51 Headache: Secondary | ICD-10-CM | POA: Insufficient documentation

## 2012-02-03 DIAGNOSIS — IMO0001 Reserved for inherently not codable concepts without codable children: Secondary | ICD-10-CM | POA: Insufficient documentation

## 2012-02-03 DIAGNOSIS — J329 Chronic sinusitis, unspecified: Secondary | ICD-10-CM

## 2012-02-03 DIAGNOSIS — R059 Cough, unspecified: Secondary | ICD-10-CM | POA: Insufficient documentation

## 2012-02-03 DIAGNOSIS — R509 Fever, unspecified: Secondary | ICD-10-CM | POA: Insufficient documentation

## 2012-02-03 MED ORDER — LEVOFLOXACIN 500 MG PO TABS: 500.0000 mg | ORAL_TABLET | Freq: Every day | ORAL | Status: AC

## 2012-02-03 MED ORDER — BUTALBITAL-APAP-CAFFEINE 50-325-40 MG PO TABS: 1.0000 | ORAL_TABLET | Freq: Four times a day (QID) | ORAL | Status: DC | PRN

## 2012-02-03 NOTE — ED Provider Notes (Signed)
Medical screening examination/treatment/procedure(s) were performed by non-physician practitioner and as supervising physician I was immediately available for consultation/collaboration.  Ethelda Chick, MD 02/03/12 2129

## 2012-02-03 NOTE — ED Notes (Signed)
Pt c/o headache and cold symptoms Productive cough (green).  Also st's has had elevated temp

## 2012-02-03 NOTE — ED Provider Notes (Signed)
History     CSN: [id number]  Arrival date & time 02/03/12  1948   First MD Initiated Contact with Patient 02/03/12 2040      Chief Complaint  Patient presents with  . Cough    (Consider location/radiation/quality/duration/timing/severity/associated sxs/prior treatment) HPI Comments: Patient here with facial pain, nasal congestion, headache, fever to 103, body aches, and post-nasal drip causing cough - she reports no previous sick contacts before this.  Patient is a 31 y.o. female presenting with cough. The history is provided by the patient. No language interpreter was used.  Cough This is a new problem. The current episode started 2 days ago. The problem occurs constantly. The problem has not changed since onset.The cough is productive of sputum. The maximum temperature recorded prior to her arrival was 103 to 104 F. The fever has been present for less than 1 day. Associated symptoms include chills, headaches, rhinorrhea and myalgias. Pertinent negatives include no chest pain, no sweats, no weight loss, no ear congestion, no ear pain, no sore throat, no shortness of breath, no wheezing and no eye redness. She has tried nothing for the symptoms. The treatment provided no relief. She is not a smoker. Her past medical history does not include bronchitis, pneumonia, bronchiectasis, COPD, emphysema or asthma.    Past Medical History  Diagnosis Date  . Migraine     Past Surgical History  Procedure Date  . Abdominal hysterectomy     No family history on file.  History  Substance Use Topics  . Smoking status: Never Smoker   . Smokeless tobacco: Not on file  . Alcohol Use: No    OB History    Grav Para Term Preterm Abortions TAB SAB Ect Mult Living                  Review of Systems  Constitutional: Positive for chills. Negative for weight loss.  HENT: Positive for rhinorrhea and sinus pressure. Negative for ear pain and sore throat.   Eyes: Negative for redness.    Respiratory: Positive for cough. Negative for shortness of breath and wheezing.   Cardiovascular: Negative for chest pain.  Musculoskeletal: Positive for myalgias.  Neurological: Positive for headaches.  All other systems reviewed and are negative.    Allergies  Doxycycline; Hydrocodone; and Macrolides and ketolides  Home Medications   Current Outpatient Rx  Name Route Sig Dispense Refill  . IBUPROFEN 200 MG PO TABS Oral Take 800 mg by mouth as needed. For pain      BP 82/55  Pulse 74  Temp(Src) 98.3 F (36.8 C) (Oral)  Resp 16  SpO2 99%  Physical Exam  Nursing note and vitals reviewed. Constitutional: She is oriented to person, place, and time. She appears well-developed and well-nourished. No distress.  HENT:  Head: Normocephalic and atraumatic.  Right Ear: External ear normal.  Left Ear: External ear normal.  Nose: Mucosal edema and rhinorrhea present. Right sinus exhibits maxillary sinus tenderness. Right sinus exhibits no frontal sinus tenderness. Left sinus exhibits maxillary sinus tenderness. Left sinus exhibits no frontal sinus tenderness.  Mouth/Throat: Oropharynx is clear and moist. No oropharyngeal exudate.  Eyes: Conjunctivae are normal. Pupils are equal, round, and reactive to light. No scleral icterus.  Neck: Normal range of motion. Neck supple.  Cardiovascular: Normal rate, regular rhythm and normal heart sounds.  Exam reveals no gallop and no friction rub.   No murmur heard. Pulmonary/Chest: Effort normal and breath sounds normal. No respiratory distress. She has no wheezes.  She has no rales. She exhibits no tenderness.  Abdominal: Soft. Bowel sounds are normal. She exhibits no distension. There is no tenderness.  Musculoskeletal: Normal range of motion. She exhibits no edema and no tenderness.  Lymphadenopathy:    She has no cervical adenopathy.  Neurological: She is alert and oriented to person, place, and time. No cranial nerve deficit. She exhibits  normal muscle tone.  Skin: Skin is warm and dry. No rash noted. No erythema. No pallor.  Psychiatric: She has a normal mood and affect. Her behavior is normal. Judgment and thought content normal.    ED Course  Procedures (including critical care time)  Labs Reviewed - No data to display No results found.   Sinusitis    MDM  Patient here with sinus pressure and sinusitis - no fever here, no nausea or vomiting.        Izola Price [place], [place] 02/03/12 2112

## 2012-02-03 NOTE — Discharge Instructions (Signed)

## 2012-03-09 ENCOUNTER — Encounter (HOSPITAL_COMMUNITY): Payer: Self-pay | Admitting: Emergency Medicine

## 2012-03-09 ENCOUNTER — Emergency Department (HOSPITAL_COMMUNITY)
Admission: EM | Admit: 2012-03-09 | Discharge: 2012-03-09 | Discharge status: Against Medical Advice | Payer: Medicaid Other | Attending: Emergency Medicine | Admitting: Emergency Medicine

## 2012-03-09 DIAGNOSIS — R509 Fever, unspecified: Secondary | ICD-10-CM | POA: Insufficient documentation

## 2012-03-09 DIAGNOSIS — R51 Headache: Secondary | ICD-10-CM | POA: Insufficient documentation

## 2012-03-09 DIAGNOSIS — R070 Pain in throat: Secondary | ICD-10-CM | POA: Insufficient documentation

## 2012-03-09 LAB — RAPID STREP SCREEN (MED CTR MEBANE ONLY): Streptococcus, Group A Screen (Direct): NEGATIVE

## 2012-03-09 NOTE — ED Notes (Signed)
PT. REPORTS SORE THROAT WITH HEADACHE/FEVER/CHILLS FOR 3 DAYS UNRELIEVED FOR SEVERAL DAYS.

## 2012-03-09 NOTE — ED Notes (Signed)
PT. IS WITH HER CHILD AT PEDIATRIC ER.

## 2012-05-20 ENCOUNTER — Encounter (HOSPITAL_COMMUNITY): Payer: Self-pay

## 2012-05-20 ENCOUNTER — Emergency Department (HOSPITAL_COMMUNITY)
Admission: EM | Admit: 2012-05-20 | Discharge: 2012-05-20 | Disposition: A | Discharge status: Home / Self Care | Payer: Medicaid Other | Attending: Emergency Medicine | Admitting: Emergency Medicine

## 2012-05-20 DIAGNOSIS — L551 Sunburn of second degree: Secondary | ICD-10-CM | POA: Insufficient documentation

## 2012-05-20 DIAGNOSIS — W899XXA Exposure to unspecified man-made visible and ultraviolet light, initial encounter: Secondary | ICD-10-CM | POA: Insufficient documentation

## 2012-05-20 MED ORDER — IBUPROFEN 800 MG PO TABS: 800.0000 mg | ORAL_TABLET | Freq: Once | ORAL | Status: AC

## 2012-05-20 MED ORDER — IBUPROFEN 800 MG PO TABS
800.0000 mg | ORAL_TABLET | Freq: Once | ORAL | Status: AC
  Administered 2012-05-20: 800 mg via ORAL
  Filled 2012-05-20: qty 1

## 2012-05-20 MED ORDER — BACITRACIN ZINC 500 UNIT/GM EX OINT: 1.0000 "application " | TOPICAL_OINTMENT | Freq: Once | CUTANEOUS | Status: AC

## 2012-05-20 MED ORDER — BACITRACIN ZINC 500 UNIT/GM EX OINT
1.0000 "application " | TOPICAL_OINTMENT | Freq: Once | CUTANEOUS | Status: AC
  Administered 2012-05-20: 1 via TOPICAL
  Filled 2012-05-20: qty 15

## 2012-05-20 NOTE — ED Notes (Signed)
Applied dressing and bacitracin ointment per MD request

## 2012-05-20 NOTE — Discharge Instructions (Signed)
Sunburn  Sunburn is damage to the skin caused by overexposure to ultraviolet (UV) rays. People with light skin or a fair complexion may be more susceptible to sunburn. Repeated sun exposure causes early skin aging such as wrinkles and sun spots. It also increases the risk of skin cancer.  CAUSES  A sunburn is caused by getting too much UV radiation from the sun.  SYMPTOMS   Red or pink skin.   Soreness and swelling.   Pain.   Blisters.   Peeling skin.   Headache, fever, and fatigue if sunburn covers a large area.  TREATMENT   Your caregiver may tell you to take certain medicines to lessen inflammation.   Your caregiver may have you use hydrocortisone cream or spray to help with itching and inflammation.   Your caregiver may prescribe an antibiotic cream to use on blisters.  HOME CARE INSTRUCTIONS    Avoid further exposure to the sun.   Cool baths and cool compresses may be helpful if used several times per day. Do not apply ice, since this may result in more damage to the skin.   Only take over-the-counter or prescription medicines for pain, discomfort, or fever as directed by your caregiver.   Use aloe or other over-the-counter sunburn creams or gels on your skin. Do not apply these creams or gels on blisters.   Drink enough fluids to keep your urine clear or pale yellow.   Do not break blisters. If blisters break, your caregiver may recommend an antibiotic cream to apply to the affected area.  PREVENTION    Try to avoid the sun between 10:00 a.m. and 4:00 p.m. when it is the strongest.   Use a sunscreen or sunblock with SPF 30 or greater.   Apply sunscreen at least 30 minutes before exposure to the sun.   Always wear protective hats, clothing, and sunglasses with UV protection.   Avoid medicines, herbs, and foods that increase your sensitivity to sunlight.   Avoid tanning beds.  SEEK IMMEDIATE MEDICAL CARE IF:    You have a fever.   Your pain is uncontrolled with medicine.   You start to  vomit or have diarrhea.   You feel faint or develop a headache with confusion.   You develop severe blistering.   You have a pus-like (purulent) discharge coming from the blisters.   Your burn becomes more painful and swollen.  MAKE SURE YOU:   Understand these instructions.   Will watch your condition.   Will get help right away if you are not doing well or get worse.  Document Released: 09/11/2005 Document Revised: 11/21/2011 Document Reviewed: 05/26/2011  [employer] Patient Information 2012 [employer]

## 2012-05-20 NOTE — ED Notes (Signed)
Family at bedside. 
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
## 2012-05-20 NOTE — ED Provider Notes (Signed)
History   This chart was scribed for Celene Kras, MD by Melba Coon. The patient was seen in room STRE6/STRE6 and the patient's care was started at 7:04PM.  CSN: [id number]  Arrival date & time 05/20/12  1726   Chief Complaint  Patient presents with  . Sunburn    HPI Heather Moon is a 31 y.o. female who presents to the Emergency Department complaining of a persistent, moderate to severe sunburn to the right shoulder and sharp pain throughout the right arm with an onset today. Abx cream has been applied to the area which slightly alleviates sx. Advil does not alleviate the pain. Pt also has blisters on right lip. No HA, fever, neck pain, sore throat, back pain, CP, SOB, abd pain, n/v/d, dysuria, or extremity pain, edema, weakness, numbness, or tingling. No known allergies. No other pertinent medical symptoms.   Past Medical History  Diagnosis Date  . Migraine     Past Surgical History  Procedure Date  . Abdominal hysterectomy     No family history on file.  History  Substance Use Topics  . Smoking status: Never Smoker   . Smokeless tobacco: Not on file  . Alcohol Use: No    OB History    Grav Para Term Preterm Abortions TAB SAB Ect Mult Living                  Review of Systems 10 Systems reviewed and all are negative for acute change except as noted in the HPI.   Allergies  Doxycycline; Hydrocodone; and Macrolides and ketolides  Home Medications   Current Outpatient Rx  Name Route Sig Dispense Refill  . IBUPROFEN 200 MG PO TABS Oral Take 600 mg by mouth every 6 (six) hours as needed. For pain    . ADULT MULTIVITAMIN W/MINERALS CH Oral Take 1 tablet by mouth daily.      BP 114/74  Pulse 86  Temp(Src) 98.6 F (37 C) (Oral)  Resp 16  SpO2 100%  Physical Exam  Nursing note and vitals reviewed. Constitutional: She appears well-developed and well-nourished. No distress.  HENT:  Head: Normocephalic and atraumatic.  Right Ear: External ear normal.    Left Ear: External ear normal.  Eyes: Conjunctivae are normal. Right eye exhibits no discharge. Left eye exhibits no discharge. No scleral icterus.  Neck: Neck supple. No tracheal deviation present.  Cardiovascular: Normal rate.   Pulmonary/Chest: Effort normal. No stridor. No respiratory distress.  Musculoskeletal: She exhibits no edema.  Neurological: She is alert. Cranial nerve deficit: no gross deficits.  Skin: Skin is warm and dry.       1st degree sunburn spread diffusely on torso; 10-15 cm area of 2nd degree sunburn with healthy granulation tissue at basewith no purulent drainage and no lymphangitic streaking  Psychiatric: She has a normal mood and affect.    ED Course  Procedures (including critical care time)  DIAGNOSTIC STUDIES: Oxygen Saturation is 100% on room air, normal by my interpretation.    COORDINATION OF CARE:  7:06PM - EDMD will Rx pain meds and abx cream for the pt.  Labs Reviewed - No data to display No results found.   1. Sunburn of second degree       MDM  Patient appears to have a first-degree and second-degree sunburn. She was out sunbathing without any sunscreen. I cautioned the patient to void this practice in the future. At this time there does not appear to be any evidence of an  acute emergency medical condition. Sunburn can be treated with antibiotic ointment in anti-inflammatory pain medications.  At this time there does not appear to be any component of a bacterial superinfection.  I personally performed the services described in this documentation, which was scribed in my presence.  The recorded information has been reviewed and considered.        Celene Kras, MD 05/20/12 [phone]

## 2012-05-20 NOTE — ED Notes (Addendum)
Pt states she went swimming last Saturday, pt states she notice the blistering and she started peeling them off and her skin became red, swelling irration, sore. Skin on rt upper shoulder possible 2 degree burn, white covered areas within burn . Pt states she took 3 advil 200 mg a piece. Pt states her lips are blistered to and she has only put chap stick on them

## 2012-05-21 MED FILL — Bacitracin Oint 500 Unit/GM: CUTANEOUS | Qty: 1 | Status: AC

## 2012-12-26 ENCOUNTER — Encounter (HOSPITAL_COMMUNITY): Payer: Self-pay | Admitting: Nurse Practitioner

## 2012-12-26 ENCOUNTER — Emergency Department (HOSPITAL_COMMUNITY)
Admission: EM | Admit: 2012-12-26 | Discharge: 2012-12-26 | Disposition: A | Discharge status: Home / Self Care | Payer: Medicaid Other | Attending: Emergency Medicine | Admitting: Emergency Medicine

## 2012-12-26 DIAGNOSIS — M545 Low back pain, unspecified: Secondary | ICD-10-CM | POA: Insufficient documentation

## 2012-12-26 DIAGNOSIS — G8929 Other chronic pain: Secondary | ICD-10-CM

## 2012-12-26 DIAGNOSIS — Z90710 Acquired absence of both cervix and uterus: Secondary | ICD-10-CM | POA: Insufficient documentation

## 2012-12-26 DIAGNOSIS — Z8679 Personal history of other diseases of the circulatory system: Secondary | ICD-10-CM | POA: Insufficient documentation

## 2012-12-26 DIAGNOSIS — Z87448 Personal history of other diseases of urinary system: Secondary | ICD-10-CM | POA: Insufficient documentation

## 2012-12-26 DIAGNOSIS — N390 Urinary tract infection, site not specified: Secondary | ICD-10-CM

## 2012-12-26 HISTORY — DX: Disorder of kidney and ureter, unspecified: N28.9

## 2012-12-26 LAB — URINALYSIS, ROUTINE W REFLEX MICROSCOPIC
Bilirubin Urine: NEGATIVE
Glucose, UA: NEGATIVE mg/dL
Ketones, ur: NEGATIVE mg/dL
Nitrite: NEGATIVE
Protein, ur: NEGATIVE mg/dL
Specific Gravity, Urine: 1.011 (ref 1.005–1.030)
Urobilinogen, UA: 0.2 mg/dL (ref 0.0–1.0)
pH: 6 (ref 5.0–8.0)

## 2012-12-26 LAB — URINE MICROSCOPIC-ADD ON

## 2012-12-26 MED ORDER — CYCLOBENZAPRINE HCL 10 MG PO TABS
10.0000 mg | ORAL_TABLET | Freq: Once | ORAL | Status: AC
  Administered 2012-12-26: 10 mg via ORAL
  Filled 2012-12-26: qty 1

## 2012-12-26 MED ORDER — CIPROFLOXACIN HCL 500 MG PO TABS: 500.0000 mg | ORAL_TABLET | Freq: Two times a day (BID) | ORAL | Status: DC

## 2012-12-26 MED ORDER — KETOROLAC TROMETHAMINE 30 MG/ML IJ SOLN
30.0000 mg | Freq: Once | INTRAMUSCULAR | Status: AC
  Administered 2012-12-26: 30 mg via INTRAMUSCULAR
  Filled 2012-12-26: qty 1

## 2012-12-26 MED ORDER — NAPROXEN SODIUM 550 MG PO TABS: 550.0000 mg | ORAL_TABLET | Freq: Two times a day (BID) | ORAL | Status: DC

## 2012-12-26 MED ORDER — CYCLOBENZAPRINE HCL 10 MG PO TABS: 10.0000 mg | ORAL_TABLET | Freq: Two times a day (BID) | ORAL | Status: DC | PRN

## 2012-12-26 NOTE — ED Provider Notes (Signed)
Medical screening examination/treatment/procedure(s) were performed by non-physician practitioner and as supervising physician I was immediately available for consultation/collaboration. Charleston Hankin, MD, FACEP   Justine Dines L Christyn Gutkowski, MD 12/26/12 1257 

## 2012-12-26 NOTE — ED Notes (Signed)
C/o lower back pain "shooting up back into my neck and shoulders and head" for past weeks. C/o painful dark spot on her skin on R hip onset yesterday. Pt c/o constipated intermittently since childbirth 2 years ago, has been more constipated over past week and tried an enema with no relief.

## 2012-12-26 NOTE — ED Provider Notes (Signed)
History     CSN: [id number]  Arrival date & time 12/26/12  1017   First MD Initiated Contact with Patient 12/26/12 1107      Chief Complaint  Patient presents with  . Back Pain   HPI Heather Moon is a 32 y.o. female who presents to the ED with back pain . The pain started 2 years ago. The pain is located in the lower back and radiates to the upper back. The pain started after she delivered her baby 2 years ago by c/s and she had an epidural. The pain is off and on. She has been back to Dr. Emelda Fear, the [facility] who did her surgery and to Dr. Gerda Diss, her PCP. She has been taking ibuprofen for pain without relief. Has also had chronic abdominal pain and constipation for 2 years. She denies loss of control of bladder or bowels. Worked yesterday and then last night the pain became severe. Works as Conservation officer, nature at [employer]. The history was provided by the patient.  Past Medical History  Diagnosis Date  . Migraine   . Renal disorder     Past Surgical History  Procedure Date  . Abdominal hysterectomy   . Renal artery stent     History reviewed. No pertinent family history.  History  Substance Use Topics  . Smoking status: Never Smoker   . Smokeless tobacco: Not on file  . Alcohol Use: No    OB History    Grav Para Term Preterm Abortions TAB SAB Ect Mult Living                  Review of Systems  Constitutional: Negative for fever, chills, diaphoresis and fatigue.  HENT: Negative for ear pain, congestion, sore throat, facial swelling, neck pain, neck stiffness, dental problem and sinus pressure.   Eyes: Negative for photophobia, pain and discharge.  Respiratory: Negative for cough, chest tightness and wheezing.   Gastrointestinal: Positive for abdominal pain and constipation. Negative for nausea, vomiting, diarrhea and abdominal distention.  Genitourinary: Positive for frequency. Negative for dysuria, flank pain, difficulty urinating and pelvic pain.  Musculoskeletal: Positive for  back pain. Negative for myalgias and gait problem.  Skin: Negative for color change and rash.  Neurological: Positive for headaches. Negative for dizziness, speech difficulty, weakness, light-headedness and numbness.  Psychiatric/Behavioral: Negative for confusion and agitation. The patient is not nervous/anxious.     Allergies  Doxycycline; Hydrocodone; and Macrolides and ketolides  Home Medications   Current Outpatient Rx  Name  Route  Sig  Dispense  Refill  . IBUPROFEN 200 MG PO TABS   Oral   Take 400 mg by mouth every 6 (six) hours as needed. For pain.         . DISPOSABLE ENEMA 19-7 GM/118ML RE ENEM   Rectal   Place 1 enema rectally once. follow package directions           BP 98/66  Pulse 92  Temp 99.3 F (37.4 C) (Oral)  Resp 20  SpO2 98%  Physical Exam  Nursing note and vitals reviewed. Constitutional: She is oriented to person, place, and time. She appears well-developed and well-nourished.  HENT:  Head: Normocephalic and atraumatic.  Right Ear: External ear and ear canal normal. Tympanic membrane is not perforated and not erythematous.  Left Ear: External ear and ear canal normal. Tympanic membrane is not perforated and not erythematous.  Mouth/Throat: Uvula is midline, oropharynx is clear and moist and mucous membranes are normal.  Eyes: EOM are normal. Pupils are equal, round, and reactive to light.  Neck: Neck supple.  Cardiovascular: Normal rate, regular rhythm and normal heart sounds.   Pulmonary/Chest: Effort normal. No respiratory distress. She has no wheezes.  Abdominal: Soft. Bowel sounds are normal. There is no tenderness.       Unable to reproduce the chronic pain that the patient has had over the past 2 years.  Genitourinary:       Patient has had hysterectomy  Musculoskeletal: Normal range of motion. She exhibits no edema.       Straight leg raises without difficulty or pain. Complains of pain in upper back with bending forward.    Neurological: She is alert and oriented to person, place, and time. She has normal strength and normal reflexes. No cranial nerve deficit or sensory deficit. Coordination and gait normal.       Steady gait, can bend and touch toes without difficulty. Pedal pulses equal and strong.  Skin: Skin is warm and dry.  Psychiatric: She has a normal mood and affect. Her behavior is normal. Judgment and thought content normal.   Procedures Assessment: 32 y.o. female with chronic pain   UTI   Constipation  Plan:  Toradol 30 mg IM now   Flexeril 10 mg po   Ibuprofen OTC   Follow up with ortho for back problems   Follow up with PCP for UTi Discussed with the patient and all questioned fully answered. She will follow up as directed or return if any problems arise.   Medication List     As of 12/26/2012 12:50 PM    START taking these medications         ciprofloxacin 500 MG tablet   Commonly known as: CIPRO   Take 1 tablet (500 mg total) by mouth every 12 (twelve) hours.      cyclobenzaprine 10 MG tablet   Commonly known as: FLEXERIL   Take 1 tablet (10 mg total) by mouth 2 (two) times daily as needed for muscle spasms.      naproxen sodium 550 MG tablet   Commonly known as: ANAPROX   Take 1 tablet (550 mg total) by mouth 2 (two) times daily with a meal.      ASK your doctor about these medications         ibuprofen 200 MG tablet   Commonly known as: ADVIL,MOTRIN      sodium phosphate enema   Commonly known as: FLEET          Where to get your medications    These are the prescriptions that you need to pick up.   You may get these medications from any pharmacy.         ciprofloxacin 500 MG tablet   cyclobenzaprine 10 MG tablet   naproxen sodium 550 MG tablet                Janne Napoleon, NP 12/26/12 1250

## 2012-12-27 ENCOUNTER — Encounter (HOSPITAL_COMMUNITY): Payer: Self-pay | Admitting: *Deleted

## 2012-12-27 ENCOUNTER — Emergency Department (HOSPITAL_COMMUNITY): Payer: Medicaid Other

## 2012-12-27 ENCOUNTER — Emergency Department (HOSPITAL_COMMUNITY)
Admission: EM | Admit: 2012-12-27 | Discharge: 2012-12-28 | Disposition: A | Discharge status: Home / Self Care | Payer: Medicaid Other | Attending: Emergency Medicine | Admitting: Emergency Medicine

## 2012-12-27 DIAGNOSIS — Z3202 Encounter for pregnancy test, result negative: Secondary | ICD-10-CM | POA: Insufficient documentation

## 2012-12-27 DIAGNOSIS — Z9889 Other specified postprocedural states: Secondary | ICD-10-CM | POA: Insufficient documentation

## 2012-12-27 DIAGNOSIS — K8020 Calculus of gallbladder without cholecystitis without obstruction: Secondary | ICD-10-CM

## 2012-12-27 DIAGNOSIS — Z79899 Other long term (current) drug therapy: Secondary | ICD-10-CM | POA: Insufficient documentation

## 2012-12-27 DIAGNOSIS — N289 Disorder of kidney and ureter, unspecified: Secondary | ICD-10-CM | POA: Insufficient documentation

## 2012-12-27 DIAGNOSIS — N83209 Unspecified ovarian cyst, unspecified side: Secondary | ICD-10-CM

## 2012-12-27 DIAGNOSIS — Z90710 Acquired absence of both cervix and uterus: Secondary | ICD-10-CM | POA: Insufficient documentation

## 2012-12-27 DIAGNOSIS — K5900 Constipation, unspecified: Secondary | ICD-10-CM

## 2012-12-27 DIAGNOSIS — Z8679 Personal history of other diseases of the circulatory system: Secondary | ICD-10-CM | POA: Insufficient documentation

## 2012-12-27 DIAGNOSIS — N390 Urinary tract infection, site not specified: Secondary | ICD-10-CM | POA: Insufficient documentation

## 2012-12-27 LAB — URINALYSIS, ROUTINE W REFLEX MICROSCOPIC
Bilirubin Urine: NEGATIVE
Glucose, UA: NEGATIVE mg/dL
Ketones, ur: 15 mg/dL — AB
Nitrite: NEGATIVE
Protein, ur: NEGATIVE mg/dL
Specific Gravity, Urine: 1.012 (ref 1.005–1.030)
Urobilinogen, UA: 1 mg/dL (ref 0.0–1.0)
pH: 7 (ref 5.0–8.0)

## 2012-12-27 LAB — CBC WITH DIFFERENTIAL/PLATELET
Basophils Absolute: 0 10*3/uL (ref 0.0–0.1)
Basophils Relative: 0 % (ref 0–1)
Eosinophils Absolute: 0.1 10*3/uL (ref 0.0–0.7)
Eosinophils Relative: 1 % (ref 0–5)
HCT: 40.8 % (ref 36.0–46.0)
Hemoglobin: 13.6 g/dL (ref 12.0–15.0)
Lymphocytes Relative: 10 % — ABNORMAL LOW (ref 12–46)
Lymphs Abs: 1.1 10*3/uL (ref 0.7–4.0)
MCH: 31.6 pg (ref 26.0–34.0)
MCHC: 33.3 g/dL (ref 30.0–36.0)
MCV: 94.9 fL (ref 78.0–100.0)
Monocytes Absolute: 0.8 10*3/uL (ref 0.1–1.0)
Monocytes Relative: 8 % (ref 3–12)
Neutro Abs: 8.1 10*3/uL — ABNORMAL HIGH (ref 1.7–7.7)
Neutrophils Relative %: 81 % — ABNORMAL HIGH (ref 43–77)
Platelets: 174 10*3/uL (ref 150–400)
RBC: 4.3 MIL/uL (ref 3.87–5.11)
RDW: 12.8 % (ref 11.5–15.5)
WBC: 10.1 10*3/uL (ref 4.0–10.5)

## 2012-12-27 LAB — POCT I-STAT, CHEM 8
Calcium, Ion: 1.2 mmol/L (ref 1.12–1.23)
Chloride: 103 mEq/L (ref 96–112)
HCT: 42 % (ref 36.0–46.0)
Hemoglobin: 14.3 g/dL (ref 12.0–15.0)
Potassium: 4 mEq/L (ref 3.5–5.1)

## 2012-12-27 LAB — URINE MICROSCOPIC-ADD ON

## 2012-12-27 LAB — PREGNANCY, URINE: Preg Test, Ur: NEGATIVE

## 2012-12-27 MED ORDER — ACETAMINOPHEN 325 MG PO TABS
650.0000 mg | ORAL_TABLET | Freq: Once | ORAL | Status: AC
  Administered 2012-12-27: 650 mg via ORAL
  Filled 2012-12-27: qty 2

## 2012-12-27 MED ORDER — ONDANSETRON HCL 4 MG/2ML IJ SOLN
4.0000 mg | Freq: Once | INTRAMUSCULAR | Status: AC
  Administered 2012-12-27: 4 mg via INTRAVENOUS
  Filled 2012-12-27: qty 2

## 2012-12-27 MED ORDER — IOHEXOL 300 MG/ML  SOLN
50.0000 mL | Freq: Once | INTRAMUSCULAR | Status: AC | PRN
  Administered 2012-12-27: 50 mL via ORAL

## 2012-12-27 MED ORDER — IOHEXOL 300 MG/ML  SOLN
100.0000 mL | Freq: Once | INTRAMUSCULAR | Status: AC | PRN
  Administered 2012-12-27: 80 mL via INTRAVENOUS

## 2012-12-27 MED ORDER — SODIUM CHLORIDE 0.9 % IV BOLUS (SEPSIS)
1000.0000 mL | Freq: Once | INTRAVENOUS | Status: AC
  Administered 2012-12-27: 1000 mL via INTRAVENOUS

## 2012-12-27 MED ORDER — FENTANYL CITRATE 0.05 MG/ML IJ SOLN
50.0000 ug | Freq: Once | INTRAMUSCULAR | Status: AC
  Administered 2012-12-27: 50 ug via INTRAVENOUS
  Filled 2012-12-27: qty 2

## 2012-12-27 MED ORDER — FENTANYL CITRATE 0.05 MG/ML IJ SOLN
50.0000 ug | Freq: Once | INTRAMUSCULAR | Status: AC
  Administered 2012-12-28: 50 ug via INTRAVENOUS
  Filled 2012-12-27: qty 2

## 2012-12-27 NOTE — ED Notes (Signed)
Patient transported to CT 

## 2012-12-27 NOTE — ED Notes (Signed)
Patient transported from CT 

## 2012-12-27 NOTE — ED Notes (Signed)
Pt st's she has had elevated temp at home continues to have abd pain with nausea.  Pt st's only has dry heaves at this time.

## 2012-12-27 NOTE — ED Notes (Addendum)
Here last night, returns for continued pain, not getting better,frustrated with experience in ED last night, last BM 1 week ago, last enema 1 week ago, meds prescribed not helping. Pt "concerned for kidney stones". Pinpoints to diffuse abd pain, lumbar back pain going up spine to neck and head. Also nausea. "unable to vomit, nothing to throw up". Denies diarrhea. Mentions fever of 103.6 PTA. Taking the cipro for UTI. concerned about using another enema d/t probable cramping increased. Last ate Sunday morning. flexeril and NSAIDS not helping back. Also, wants referral to appropriate specialist.

## 2012-12-28 MED ORDER — ONDANSETRON HCL 4 MG PO TABS: 4.0000 mg | ORAL_TABLET | Freq: Four times a day (QID) | ORAL | Status: DC

## 2012-12-28 MED ORDER — OXYCODONE-ACETAMINOPHEN 5-325 MG PO TABS: 1.0000 | ORAL_TABLET | Freq: Four times a day (QID) | ORAL | Status: DC | PRN

## 2012-12-28 NOTE — ED Notes (Signed)
Pt ambulating independently w/ steady gait on d/c in no acute distress, A&Ox4.D/c instructions reviewed w/ pt and family - pt and family deny any further questions or concerns at present.  

## 2012-12-28 NOTE — ED Notes (Signed)
MD at bedside. 

## 2012-12-28 NOTE — ED Provider Notes (Signed)
History     CSN: [id number]  Arrival date & time 12/27/12  1941   First MD Initiated Contact with Patient 12/27/12 2044      Chief Complaint  Patient presents with  . Abdominal Pain    (Consider location/radiation/quality/duration/timing/severity/associated sxs/prior treatment) HPI Pt presents to the ED with complaints of abdominal pains. Pt was seen in the ED yesterday for the same. She tells me she was diagnosed with constipation but that she feels she has a kidney stone or bowel obstruction. She has had surgeries in the past including an emergent c-section after hemorrhaging after vaginal delivery two years ago. She says that her flank hurts, the she is constipation, is nausea and has been vomiting as well. She also complains of fever PTA. She was diagnosed with UTI yesterday and started on Cipro. The pt is currently in nad and vss but still having diffuse belly pain.  Past Medical History  Diagnosis Date  . Migraine   . Renal disorder     Past Surgical History  Procedure Date  . Abdominal hysterectomy   . Renal artery stent     No family history on file.  History  Substance Use Topics  . Smoking status: Never Smoker   . Smokeless tobacco: Not on file  . Alcohol Use: No    OB History    Grav Para Term Preterm Abortions TAB SAB Ect Mult Living                  Review of Systems  Review of Systems  Gen: no weight loss, fevers, chills, night sweats  Eyes: no discharge or drainage, no occular pain or visual changes  Nose: no epistaxis or rhinorrhea  Mouth: no dental pain, no sore throat  Neck: no neck pain  Lungs:No wheezing, coughing or hemoptysis CV: no chest pain, palpitations, dependent edema or orthopnea  Abd: + abdominal pain, nausea, vomiting and constipation GU: no dysuria or gross hematuria  MSK:  No abnormalities  Neuro: no headache, no focal neurologic deficits  Skin: no abnormalities Psyche: negative.   Allergies  Doxycycline; Hydrocodone;  and Macrolides and ketolides  Home Medications   Current Outpatient Rx  Name  Route  Sig  Dispense  Refill  . CIPROFLOXACIN HCL 500 MG PO TABS   Oral   Take 500 mg by mouth every 12 (twelve) hours. For 10 days; Start date 12/26/12         . CYCLOBENZAPRINE HCL 10 MG PO TABS   Oral   Take 10 mg by mouth 2 (two) times daily as needed. For muscle spasms         . IBUPROFEN 200 MG PO TABS   Oral   Take 400 mg by mouth every 6 (six) hours as needed. For pain.         Marland Kitchen NAPROXEN SODIUM 550 MG PO TABS   Oral   Take 1 tablet (550 mg total) by mouth 2 (two) times daily with a meal.   15 tablet   0   . ONDANSETRON HCL 4 MG PO TABS   Oral   Take 1 tablet (4 mg total) by mouth every 6 (six) hours.   12 tablet   0   . OXYCODONE-ACETAMINOPHEN 5-325 MG PO TABS   Oral   Take 1 tablet by mouth every 6 (six) hours as needed for pain.   15 tablet   0     BP 92/57  Pulse 60  Temp 97.5 F (36.4  C) (Oral)  Resp 16  SpO2 96%  Physical Exam  Nursing note and vitals reviewed. Constitutional: She appears well-developed and well-nourished. No distress.  HENT:  Head: Normocephalic and atraumatic.  Eyes: Pupils are equal, round, and reactive to light.  Neck: Normal range of motion. Neck supple.  Cardiovascular: Normal rate and regular rhythm.   Pulmonary/Chest: Effort normal.  Abdominal: Soft. There is generalized tenderness. There is guarding (voluntary guarding). There is no rigidity, no rebound, no tenderness at McBurney's point and negative Murphy's sign.  Neurological: She is alert.  Skin: Skin is warm and dry.    ED Course  Procedures (including critical care time)  Labs Reviewed  CBC WITH DIFFERENTIAL - Abnormal; Notable for the following:    Neutrophils Relative 81 (*)     Neutro Abs 8.1 (*)     Lymphocytes Relative 10 (*)     All other components within normal limits  URINALYSIS, ROUTINE W REFLEX MICROSCOPIC - Abnormal; Notable for the following:    APPearance  CLOUDY (*)     Hgb urine dipstick SMALL (*)     Ketones, ur 15 (*)     Leukocytes, UA SMALL (*)     All other components within normal limits  POCT I-STAT, CHEM 8 - Abnormal; Notable for the following:    Creatinine, Ser 1.30 (*)     Glucose, Bld 126 (*)     All other components within normal limits  URINE MICROSCOPIC-ADD ON - Abnormal; Notable for the following:    Squamous Epithelial / LPF FEW (*)     All other components within normal limits  PREGNANCY, URINE   Ct Abdomen Pelvis W Contrast  12/27/2012  *RADIOLOGY REPORT*  Clinical Data: Bilateral abdominal pain.  Nausea.  Fever. Continued pain since previous dB visit last night.  The  CT ABDOMEN AND PELVIS WITH CONTRAST  Technique:  Multidetector CT imaging of the abdomen and pelvis was performed following the standard protocol during bolus administration of intravenous contrast.  Contrast: 80mL OMNIPAQUE IOHEXOL 300 MG/ML  SOLN  Comparison: 10/29/2009  Findings: The lung bases are clear.  A large stone fills the gallbladder.  No gallbladder wall thickening or infiltration.  No bile duct dilatation.  The liver, spleen, pancreas, adrenal glands, abdominal aorta, and retroperitoneal lymph nodes are unremarkable. Atrophy and scarring of the left kidney.  Fetal lobulation of both kidneys.  No hydronephrosis.  The diffusely stool filled colon without distension.  The stomach and small bowel are not abnormally distended.  No bowel wall thickening is appreciated.  No free air or free fluid in the abdomen.  Small umbilical hernia containing fat.  Pelvis:  There is fluid around the left adnexa extending posterior to the uterus.  There is a heterogeneous curvilinear enhancement in the left ovary.  Changes suggest an involuting cyst with physiologic fluid.  Follow-up ultrasound in 6-12 weeks may be useful for correlation and demonstrate resolution.  The uterus is not enlarged.  No right adnexal mass or fluid.  The appendix is normal.  No diverticulitis.  No  significant pelvic lymphadenopathy. Subcutaneous gas collection in the left gluteal fat likely representing injection site.  Normal alignment of the lumbar vertebrae.  IMPRESSION: Cholelithiasis with large gallstone filling the gallbladder. Diffusely stool filled colon may represent constipation.  Left adnexal changes suggesting involuting cyst with physiologic fluid. Consider follow-up ultrasound to demonstrate resolution.   Original Report Authenticated By: Burman Nieves, M.D.      1. Constipation   2. Cholelithiasis  3. Other and unspecified ovarian cysts       MDM  Pt is already on cipro for UTI. She has small leukocytes and small amount of WBC and hgb. Culture has been sent out.   Her CT scan shows that she is positive for constipation, cholelithiasis and adenexal cysts.    The patients pain pattern is more consistent with biliary colic. She has been using stool softners, enemas, dietary changes, osmotic laxatives for the constipation with mild relief.  Pt given referral to [facility] G[facility].  Pt has been advised of the symptoms that warrant their return to the ED. Patient has voiced understanding and has agreed to follow-up with the PCP or specialist.         Dorthula Matas, PA 12/28/12 [phone]

## 2012-12-30 ENCOUNTER — Encounter (INDEPENDENT_AMBULATORY_CARE_PROVIDER_SITE_OTHER): Payer: Self-pay | Admitting: General Surgery

## 2012-12-30 ENCOUNTER — Ambulatory Visit (INDEPENDENT_AMBULATORY_CARE_PROVIDER_SITE_OTHER): Payer: Medicaid Other | Admitting: Surgery

## 2012-12-30 ENCOUNTER — Encounter (INDEPENDENT_AMBULATORY_CARE_PROVIDER_SITE_OTHER): Payer: Self-pay | Admitting: Surgery

## 2012-12-30 VITALS — BP 102/66 | HR 80 | Temp 98.1°F | Ht 64.0 in | Wt 126.8 lb

## 2012-12-30 DIAGNOSIS — K8020 Calculus of gallbladder without cholecystitis without obstruction: Secondary | ICD-10-CM | POA: Insufficient documentation

## 2012-12-30 LAB — URINE CULTURE: Colony Count: >=100000

## 2012-12-30 NOTE — ED Provider Notes (Signed)
I saw and evaluated the patient, reviewed the resident's note and I agree with the findings and plan.   Pt with abdominal pain. Tenderness on exam but not peritoneal. Lungs clear. Heart regular. Not distress. Skin warm and dry. CT with cholelithiasis which is most likely etiology. Doubt cholecystitis. Plan pain meds and surgical referral.  Raeford Razor, MD 12/30/12 1041

## 2012-12-30 NOTE — Progress Notes (Signed)
Patient ID: Heather Moon, female   DOB: 04-Apr-1981, 32 y.o.   MRN: [id number]  Chief Complaint  Patient presents with  . New Evaluation    eval gallbladder    HPI Heather Moon is a 32 y.o. female.   HPI This is a 32 year old female referred by Heather Moon for possible  Symptomatic cholelithiasis. She has been having epigastric abdominal pain hurting some due to the back and to the right upper quadrant with nausea and vomiting for several months. She has also had severe constipation which she reports she has had since her C-section years ago. The pain is intermittent as well as sharp and moderate in intensity. Past Medical History  Diagnosis Date  . Migraine   . Renal disorder   . Blood transfusion without reported diagnosis     Past Surgical History  Procedure Date  . Abdominal hysterectomy   . Renal artery stent   . Eye surgery     Family History  Problem Relation Age of Onset  . Cancer Maternal Grandmother   . Cirrhosis Maternal Grandfather     Social History History  Substance Use Topics  . Smoking status: Never Smoker   . Smokeless tobacco: Not on file  . Alcohol Use: No    Allergies  Allergen Reactions  . Doxycycline Hives  . Hydrocodone Nausea And Vomiting  . Macrolides And Ketolides Nausea Only    Current Outpatient Prescriptions  Medication Sig Dispense Refill  . ciprofloxacin (CIPRO) 500 MG tablet Take 500 mg by mouth every 12 (twelve) hours. For 10 days; Start date 12/26/12      . cyclobenzaprine (FLEXERIL) 10 MG tablet Take 10 mg by mouth 2 (two) times daily as needed. For muscle spasms      . ibuprofen (ADVIL,MOTRIN) 200 MG tablet Take 400 mg by mouth every 6 (six) hours as needed. For pain.      Marland Kitchen ondansetron (ZOFRAN) 4 MG tablet Take 1 tablet (4 mg total) by mouth every 6 (six) hours.  12 tablet  0  . oxyCODONE-acetaminophen (PERCOCET/ROXICET) 5-325 MG per tablet Take 1 tablet by mouth every 6 (six) hours as needed for pain.  15 tablet  0     Review of Systems Review of Systems  Constitutional: Negative for fever, chills and unexpected weight change.  HENT: Negative for hearing loss, congestion, sore throat, trouble swallowing and voice change.   Eyes: Negative for visual disturbance.  Respiratory: Negative for cough and wheezing.   Cardiovascular: Negative for chest pain, palpitations and leg swelling.  Gastrointestinal: Positive for nausea, vomiting, abdominal pain and constipation. Negative for diarrhea, blood in stool, abdominal distention and anal bleeding.  Genitourinary: Negative for hematuria, vaginal bleeding and difficulty urinating.  Musculoskeletal: Negative for arthralgias.  Skin: Negative for rash and wound.  Neurological: Negative for seizures, syncope and headaches.  Hematological: Negative for adenopathy. Does not bruise/bleed easily.  Psychiatric/Behavioral: Negative for confusion.    Blood pressure 102/66, pulse 80, temperature 98.1 F (36.7 C), temperature source Temporal, height 5\' 4"  (1.626 m), weight 126 lb 12.8 oz (57.516 kg), SpO2 99.00%.  Physical Exam Physical Exam  Constitutional: She is oriented to person, place, and time. She appears well-developed and well-nourished. No distress.  HENT:  Head: Normocephalic and atraumatic.  Right Ear: External ear normal.  Left Ear: External ear normal.  Nose: Nose normal.  Mouth/Throat: Oropharynx is clear and moist.  Eyes: Conjunctivae normal are normal. Pupils are equal, round, and reactive to light. Right eye exhibits no  discharge. Left eye exhibits no discharge. No scleral icterus.  Neck: Normal range of motion. Neck supple. No tracheal deviation present.  Cardiovascular: Normal rate, regular rhythm, normal heart sounds and intact distal pulses.   No murmur heard. Pulmonary/Chest: Effort normal and breath sounds normal. No respiratory distress. She has no wheezes.  Abdominal: Soft. Bowel sounds are normal. She exhibits no distension. There is no  tenderness. There is no rebound.  Musculoskeletal: Normal range of motion. She exhibits no edema and no tenderness.  Lymphadenopathy:    She has no cervical adenopathy.  Neurological: She is alert and oriented to person, place, and time.  Skin: Skin is warm and dry. No rash noted. She is not diaphoretic. No erythema.  Psychiatric: Her behavior is normal. Judgment normal.    Data Reviewed I have reviewed her CAT scan showing constipation as well as a very large gallstone  Assessment    Symptomatically lithiasis    Plan    Laparoscopic cholecystectomy with possible glandular and is recommended. I discussed this with her in detail and gave her literature regarding this. I discussed the surgical procedure in detail. I discussed the risks which includes but is not limited to bleeding, infection, bile duct injury, bile leak, injury to other structures, the chance this may not resolve all her symptoms, etc. I also discussed possibility of conversion to an open procedure. I still believe she does need to see a gastroenterologist regarding her constipation. Surgery will be scheduled. Likelihood of success is good       Heather Moon A 12/30/2012, 11:21 AM

## 2012-12-31 NOTE — ED Notes (Signed)
+   Urine Chart sent to EDP office for review. 

## 2013-01-05 ENCOUNTER — Encounter (HOSPITAL_BASED_OUTPATIENT_CLINIC_OR_DEPARTMENT_OTHER): Payer: Self-pay | Admitting: *Deleted

## 2013-01-06 ENCOUNTER — Encounter (HOSPITAL_BASED_OUTPATIENT_CLINIC_OR_DEPARTMENT_OTHER): Payer: Self-pay | Admitting: *Deleted

## 2013-01-06 NOTE — ED Notes (Signed)
Chart returned from EDP office. Tx'ed with Cipro which is adequate per Ronney Asters

## 2013-01-10 NOTE — H&P (Signed)
Patient ID: Heather Moon, female DOB: March 26, 1981, 32 y.o. MRN: [id number]  Chief Complaint   Patient presents with   .  New Evaluation     eval gallbladder    HPI  Heather Moon is a 32 y.o. female.  HPI  This is a 32 year old female referred by Dr. Gerda Diss for possible Symptomatic cholelithiasis. She has been having epigastric abdominal pain hurting some due to the back and to the right upper quadrant with nausea and vomiting for several months. She has also had severe constipation which she reports she has had since her C-section years ago. The pain is intermittent as well as sharp and moderate in intensity.  Past Medical History   Diagnosis  Date   .  Migraine    .  Renal disorder    .  Blood transfusion without reported diagnosis     Past Surgical History   Procedure  Date   .  Abdominal hysterectomy    .  Renal artery stent    .  Eye surgery     Family History   Problem  Relation  Age of Onset   .  Cancer  Maternal Grandmother    .  Cirrhosis  Maternal Grandfather     Social History  History   Substance Use Topics   .  Smoking status:  Never Smoker   .  Smokeless tobacco:  Not on file   .  Alcohol Use:  No    Allergies   Allergen  Reactions   .  Doxycycline  Hives   .  Hydrocodone  Nausea And Vomiting   .  Macrolides And Ketolides  Nausea Only    Current Outpatient Prescriptions   Medication  Sig  Dispense  Refill   .  ciprofloxacin (CIPRO) 500 MG tablet  Take 500 mg by mouth every 12 (twelve) hours. For 10 days; Start date 12/26/12     .  cyclobenzaprine (FLEXERIL) 10 MG tablet  Take 10 mg by mouth 2 (two) times daily as needed. For muscle spasms     .  ibuprofen (ADVIL,MOTRIN) 200 MG tablet  Take 400 mg by mouth every 6 (six) hours as needed. For pain.     Marland Kitchen  ondansetron (ZOFRAN) 4 MG tablet  Take 1 tablet (4 mg total) by mouth every 6 (six) hours.  12 tablet  0   .  oxyCODONE-acetaminophen (PERCOCET/ROXICET) 5-325 MG per tablet  Take 1 tablet by mouth every  6 (six) hours as needed for pain.  15 tablet  0    Review of Systems  Review of Systems  Constitutional: Negative for fever, chills and unexpected weight change.  HENT: Negative for hearing loss, congestion, sore throat, trouble swallowing and voice change.  Eyes: Negative for visual disturbance.  Respiratory: Negative for cough and wheezing.  Cardiovascular: Negative for chest pain, palpitations and leg swelling.  Gastrointestinal: Positive for nausea, vomiting, abdominal pain and constipation. Negative for diarrhea, blood in stool, abdominal distention and anal bleeding.  Genitourinary: Negative for hematuria, vaginal bleeding and difficulty urinating.  Musculoskeletal: Negative for arthralgias.  Skin: Negative for rash and wound.  Neurological: Negative for seizures, syncope and headaches.  Hematological: Negative for adenopathy. Does not bruise/bleed easily.  Psychiatric/Behavioral: Negative for confusion.   Blood pressure 102/66, pulse 80, temperature 98.1 F (36.7 C), temperature source Temporal, height 5\' 4"  (1.626 m), weight 126 lb 12.8 oz (57.516 kg), SpO2 99.00%.  Physical Exam  Physical Exam  Constitutional: She is oriented to  person, place, and time. She appears well-developed and well-nourished. No distress.  HENT:  Head: Normocephalic and atraumatic.  Right Ear: External ear normal.  Left Ear: External ear normal.  Nose: Nose normal.  Mouth/Throat: Oropharynx is clear and moist.  Eyes: Conjunctivae normal are normal. Pupils are equal, round, and reactive to light. Right eye exhibits no discharge. Left eye exhibits no discharge. No scleral icterus.  Neck: Normal range of motion. Neck supple. No tracheal deviation present.  Cardiovascular: Normal rate, regular rhythm, normal heart sounds and intact distal pulses.  No murmur heard.  Pulmonary/Chest: Effort normal and breath sounds normal. No respiratory distress. She has no wheezes.  Abdominal: Soft. Bowel sounds are  normal. She exhibits no distension. There is no tenderness. There is no rebound.  Musculoskeletal: Normal range of motion. She exhibits no edema and no tenderness.  Lymphadenopathy:  She has no cervical adenopathy.  Neurological: She is alert and oriented to person, place, and time.  Skin: Skin is warm and dry. No rash noted. She is not diaphoretic. No erythema.  Psychiatric: Her behavior is normal. Judgment normal.   Data Reviewed  I have reviewed her CAT scan showing constipation as well as a very large gallstone  Assessment   Symptomatic cholelithiasis   Plan   Laparoscopic cholecystectomy with possible glandular and is recommended. I discussed this with her in detail and gave her literature regarding this. I discussed the surgical procedure in detail. I discussed the risks which includes but is not limited to bleeding, infection, bile duct injury, bile leak, injury to other structures, the chance this may not resolve all her symptoms, etc. I also discussed possibility of conversion to an open procedure. I still believe she does need to see a gastroenterologist regarding her constipation. Surgery will be scheduled. Likelihood of success is good

## 2013-01-11 ENCOUNTER — Ambulatory Visit (HOSPITAL_BASED_OUTPATIENT_CLINIC_OR_DEPARTMENT_OTHER)
Admission: RE | Admit: 2013-01-11 | Discharge: 2013-01-11 | Disposition: A | Discharge status: Home / Self Care | Payer: Medicaid Other | Source: Ambulatory Visit | Attending: Surgery | Admitting: Surgery

## 2013-01-11 ENCOUNTER — Encounter (HOSPITAL_BASED_OUTPATIENT_CLINIC_OR_DEPARTMENT_OTHER): Payer: Self-pay | Admitting: Certified Registered Nurse Anesthetist

## 2013-01-11 ENCOUNTER — Encounter (HOSPITAL_BASED_OUTPATIENT_CLINIC_OR_DEPARTMENT_OTHER): Payer: Self-pay

## 2013-01-11 ENCOUNTER — Encounter (HOSPITAL_BASED_OUTPATIENT_CLINIC_OR_DEPARTMENT_OTHER)
Admission: RE | Disposition: A | Discharge status: Home / Self Care | Payer: Self-pay | Source: Ambulatory Visit | Attending: Surgery

## 2013-01-11 ENCOUNTER — Ambulatory Visit (HOSPITAL_BASED_OUTPATIENT_CLINIC_OR_DEPARTMENT_OTHER): Payer: Medicaid Other | Admitting: Certified Registered Nurse Anesthetist

## 2013-01-11 DIAGNOSIS — K219 Gastro-esophageal reflux disease without esophagitis: Secondary | ICD-10-CM | POA: Insufficient documentation

## 2013-01-11 DIAGNOSIS — K8010 Calculus of gallbladder with chronic cholecystitis without obstruction: Secondary | ICD-10-CM

## 2013-01-11 HISTORY — DX: Gastro-esophageal reflux disease without esophagitis: K21.9

## 2013-01-11 HISTORY — PX: CHOLECYSTECTOMY: SHX55

## 2013-01-11 HISTORY — DX: Calculus of gallbladder without cholecystitis without obstruction: K80.20

## 2013-01-11 HISTORY — DX: Other constipation: K59.09

## 2013-01-11 SURGERY — LAPAROSCOPIC CHOLECYSTECTOMY
Anesthesia: General | Site: Abdomen | Wound class: Clean Contaminated

## 2013-01-11 MED ORDER — KETOROLAC TROMETHAMINE 30 MG/ML IJ SOLN
INTRAMUSCULAR | Status: DC | PRN
  Administered 2013-01-11: 30 mg via INTRAVENOUS

## 2013-01-11 MED ORDER — DEXAMETHASONE SODIUM PHOSPHATE 4 MG/ML IJ SOLN
INTRAMUSCULAR | Status: DC | PRN
  Administered 2013-01-11: 10 mg via INTRAVENOUS

## 2013-01-11 MED ORDER — OXYCODONE HCL 5 MG/5ML PO SOLN: 5.0000 mg | Freq: Once | ORAL | Status: AC | PRN

## 2013-01-11 MED ORDER — SODIUM CHLORIDE 0.9 % IR SOLN
Status: DC | PRN
  Administered 2013-01-11: 1000 mL

## 2013-01-11 MED ORDER — NEOSTIGMINE METHYLSULFATE 1 MG/ML IJ SOLN
INTRAMUSCULAR | Status: DC | PRN
  Administered 2013-01-11: 3.5 mg via INTRAVENOUS

## 2013-01-11 MED ORDER — SODIUM CHLORIDE 0.9 % IJ SOLN: 3.0000 mL | INTRAMUSCULAR | Status: DC | PRN

## 2013-01-11 MED ORDER — ACETAMINOPHEN 650 MG RE SUPP: 650.0000 mg | RECTAL | Status: DC | PRN

## 2013-01-11 MED ORDER — ONDANSETRON HCL 4 MG/2ML IJ SOLN
INTRAMUSCULAR | Status: DC | PRN
  Administered 2013-01-11: 4 mg via INTRAVENOUS

## 2013-01-11 MED ORDER — LIDOCAINE HCL (CARDIAC) 20 MG/ML IV SOLN
INTRAVENOUS | Status: DC | PRN
  Administered 2013-01-11: 60 mg via INTRAVENOUS

## 2013-01-11 MED ORDER — OXYCODONE HCL 5 MG PO TABS: 5.0000 mg | ORAL_TABLET | ORAL | Status: DC | PRN

## 2013-01-11 MED ORDER — OXYCODONE-ACETAMINOPHEN 7.5-325 MG PO TABS: 2.0000 | ORAL_TABLET | ORAL | Status: DC | PRN

## 2013-01-11 MED ORDER — HYDROMORPHONE HCL PF 1 MG/ML IJ SOLN
0.2500 mg | INTRAMUSCULAR | Status: DC | PRN
  Administered 2013-01-11 (×4): 0.5 mg via INTRAVENOUS

## 2013-01-11 MED ORDER — CEFAZOLIN SODIUM-DEXTROSE 2-3 GM-% IV SOLR
2.0000 g | INTRAVENOUS | Status: AC
  Administered 2013-01-11: 2 g via INTRAVENOUS

## 2013-01-11 MED ORDER — BUPIVACAINE-EPINEPHRINE PF 0.5-1:200000 % IJ SOLN
INTRAMUSCULAR | Status: DC | PRN
  Administered 2013-01-11: 20 mL

## 2013-01-11 MED ORDER — FENTANYL CITRATE 0.05 MG/ML IJ SOLN
INTRAMUSCULAR | Status: DC | PRN
  Administered 2013-01-11: 50 ug via INTRAVENOUS

## 2013-01-11 MED ORDER — LACTATED RINGERS IV SOLN
INTRAVENOUS | Status: DC
  Administered 2013-01-11 (×3): via INTRAVENOUS

## 2013-01-11 MED ORDER — PROPOFOL 10 MG/ML IV BOLUS
INTRAVENOUS | Status: DC | PRN
  Administered 2013-01-11: 150 mg via INTRAVENOUS

## 2013-01-11 MED ORDER — MIDAZOLAM HCL 5 MG/5ML IJ SOLN
INTRAMUSCULAR | Status: DC | PRN
  Administered 2013-01-11: 2 mg via INTRAVENOUS

## 2013-01-11 MED ORDER — SODIUM CHLORIDE 0.9 % IJ SOLN: 3.0000 mL | Freq: Two times a day (BID) | INTRAMUSCULAR | Status: DC

## 2013-01-11 MED ORDER — ROCURONIUM BROMIDE 100 MG/10ML IV SOLN
INTRAVENOUS | Status: DC | PRN
  Administered 2013-01-11: 25 mg via INTRAVENOUS

## 2013-01-11 MED ORDER — MORPHINE SULFATE 4 MG/ML IJ SOLN: 4.0000 mg | INTRAMUSCULAR | Status: DC | PRN

## 2013-01-11 MED ORDER — GLYCOPYRROLATE 0.2 MG/ML IJ SOLN
INTRAMUSCULAR | Status: DC | PRN
  Administered 2013-01-11: .5 mg via INTRAVENOUS

## 2013-01-11 MED ORDER — ACETAMINOPHEN 325 MG PO TABS: 650.0000 mg | ORAL_TABLET | ORAL | Status: DC | PRN

## 2013-01-11 MED ORDER — ACETAMINOPHEN 10 MG/ML IV SOLN
1000.0000 mg | Freq: Once | INTRAVENOUS | Status: AC
  Administered 2013-01-11: 1000 mg via INTRAVENOUS

## 2013-01-11 MED ORDER — ONDANSETRON HCL 4 MG/2ML IJ SOLN: 4.0000 mg | Freq: Four times a day (QID) | INTRAMUSCULAR | Status: DC | PRN

## 2013-01-11 MED ORDER — OXYCODONE HCL 5 MG PO TABS
5.0000 mg | ORAL_TABLET | Freq: Once | ORAL | Status: AC | PRN
  Administered 2013-01-11: 5 mg via ORAL

## 2013-01-11 MED ORDER — SODIUM CHLORIDE 0.9 % IV SOLN: 250.0000 mL | INTRAVENOUS | Status: DC | PRN

## 2013-01-11 MED ORDER — LACTATED RINGERS IV SOLN: INTRAVENOUS | Status: DC

## 2013-01-11 SURGICAL SUPPLY — 42 items
APPLIER CLIP 5 13 M/L LIGAMAX5 (MISCELLANEOUS) ×2
BANDAGE ADHESIVE 1X3 (GAUZE/BANDAGES/DRESSINGS) ×8 IMPLANT
BENZOIN TINCTURE PRP APPL 2/3 (GAUZE/BANDAGES/DRESSINGS) ×2 IMPLANT
BLADE SURG ROTATE 9660 (MISCELLANEOUS) IMPLANT
CANISTER SUCTION 2500CC (MISCELLANEOUS) ×2 IMPLANT
CHLORAPREP W/TINT 26ML (MISCELLANEOUS) ×2 IMPLANT
CLIP APPLIE 5 13 M/L LIGAMAX5 (MISCELLANEOUS) ×1 IMPLANT
CLOTH BEACON ORANGE TIMEOUT ST (SAFETY) ×2 IMPLANT
COVER MAYO STAND STRL (DRAPES) ×2 IMPLANT
DECANTER SPIKE VIAL GLASS SM (MISCELLANEOUS) ×2 IMPLANT
DRAPE C-ARM 42X72 X-RAY (DRAPES) IMPLANT
DRAPE UTILITY XL STRL (DRAPES) ×2 IMPLANT
ELECT REM PT RETURN 9FT ADLT (ELECTROSURGICAL) ×2
ELECTRODE REM PT RTRN 9FT ADLT (ELECTROSURGICAL) ×1 IMPLANT
FILTER SMOKE EVAC LAPAROSHD (FILTER) IMPLANT
GLOVE BIOGEL M 7.0 STRL (GLOVE) ×2 IMPLANT
GLOVE BIOGEL PI IND STRL 6.5 (GLOVE) ×1 IMPLANT
GLOVE BIOGEL PI IND STRL 7.5 (GLOVE) ×1 IMPLANT
GLOVE BIOGEL PI INDICATOR 6.5 (GLOVE) ×1
GLOVE BIOGEL PI INDICATOR 7.5 (GLOVE) ×1
GLOVE ECLIPSE 6.5 STRL STRAW (GLOVE) ×2 IMPLANT
GLOVE SURG SIGNA 7.5 PF LTX (GLOVE) ×2 IMPLANT
GOWN PREVENTION PLUS XLARGE (GOWN DISPOSABLE) ×4 IMPLANT
GOWN PREVENTION PLUS XXLARGE (GOWN DISPOSABLE) ×2 IMPLANT
GOWN STRL NON-REIN LRG LVL3 (GOWN DISPOSABLE) IMPLANT
NS IRRIG 1000ML POUR BTL (IV SOLUTION) IMPLANT
PACK BASIN DAY SURGERY FS (CUSTOM PROCEDURE TRAY) ×2 IMPLANT
POUCH SPECIMEN RETRIEVAL 10MM (ENDOMECHANICALS) ×2 IMPLANT
SCISSORS LAP 5X35 DISP (ENDOMECHANICALS) IMPLANT
SET CHOLANGIOGRAPH 5 50 .035 (SET/KITS/TRAYS/PACK) IMPLANT
SET IRRIG TUBING LAPAROSCOPIC (IRRIGATION / IRRIGATOR) ×2 IMPLANT
SLEEVE ENDOPATH XCEL 5M (ENDOMECHANICALS) ×4 IMPLANT
SLEEVE SCD COMPRESS KNEE MED (MISCELLANEOUS) ×2 IMPLANT
SPECIMEN JAR SMALL (MISCELLANEOUS) ×2 IMPLANT
SUT MON AB 4-0 PC3 18 (SUTURE) ×2 IMPLANT
SUT VICRYL 0 UR6 27IN ABS (SUTURE) ×2 IMPLANT
TOWEL OR 17X24 6PK STRL BLUE (TOWEL DISPOSABLE) ×2 IMPLANT
TOWEL OR NON WOVEN STRL DISP B (DISPOSABLE) ×2 IMPLANT
TRAY LAPAROSCOPIC (CUSTOM PROCEDURE TRAY) ×2 IMPLANT
TROCAR XCEL BLUNT TIP 100MML (ENDOMECHANICALS) ×2 IMPLANT
TROCAR XCEL NON-BLD 5MMX100MML (ENDOMECHANICALS) ×2 IMPLANT
TUBE CONNECTING 20X1/4 (TUBING) ×2 IMPLANT

## 2013-01-11 NOTE — Anesthesia Preprocedure Evaluation (Signed)
Anesthesia Evaluation  Patient identified by MRN, date of birth, ID band Patient awake    Reviewed: Allergy & Precautions, H&P , NPO status , Patient's Chart, lab work & pertinent test results  Airway Mallampati: I TM Distance: >3 FB Neck ROM: Full    Dental No notable dental hx. (+) Teeth Intact and Dental Advisory Given   Pulmonary neg pulmonary ROS,  breath sounds clear to auscultation  Pulmonary exam normal       Cardiovascular negative cardio ROS  Rhythm:Regular Rate:Normal     Neuro/Psych  Headaches, negative psych ROS   GI/Hepatic Neg liver ROS, GERD-  Medicated and Controlled,  Endo/Other  negative endocrine ROS  Renal/GU negative Renal ROS  negative genitourinary   Musculoskeletal   Abdominal   Peds  Hematology negative hematology ROS (+)   Anesthesia Other Findings   Reproductive/Obstetrics negative OB ROS                           Anesthesia Physical Anesthesia Plan  ASA: II  Anesthesia Plan: General   Post-op Pain Management:    Induction: Intravenous  Airway Management Planned: Oral ETT  Additional Equipment:   Intra-op Plan:   Post-operative Plan: Extubation in OR  Informed Consent: I have reviewed the patients History and Physical, chart, labs and discussed the procedure including the risks, benefits and alternatives for the proposed anesthesia with the patient or authorized representative who has indicated his/her understanding and acceptance.   Dental advisory given  Plan Discussed with: CRNA  Anesthesia Plan Comments:         Anesthesia Quick Evaluation

## 2013-01-11 NOTE — Anesthesia Procedure Notes (Signed)
Procedure Name: Intubation Date/Time: 01/11/2013 11:04 AM Performed by: Kiing Deakin D Pre-anesthesia Checklist: Patient identified, Emergency Drugs available, Suction available and Patient being monitored Patient Re-evaluated:Patient Re-evaluated prior to inductionOxygen Delivery Method: Circle System Utilized Preoxygenation: Pre-oxygenation with 100% oxygen Intubation Type: IV induction Ventilation: Mask ventilation without difficulty Laryngoscope Size: Mac and 3 Grade View: Grade I Tube type: Oral Tube size: 7.0 mm Number of attempts: 1 Airway Equipment and Method: stylet and LTA kit utilized Placement Confirmation: ETT inserted through vocal cords under direct vision,  positive ETCO2 and breath sounds checked- equal and bilateral Secured at: 21 cm Tube secured with: Tape Dental Injury: Teeth and Oropharynx as per pre-operative assessment

## 2013-01-11 NOTE — Anesthesia Postprocedure Evaluation (Signed)
  Anesthesia Post-op Note  Patient: Heather Moon  Procedure(s) Performed: Procedure(s) (LRB) with comments: LAPAROSCOPIC CHOLECYSTECTOMY (N/A) - Laparoscopic cholecystectomy  Patient Location: PACU  Anesthesia Type:General  Level of Consciousness: awake  Airway and Oxygen Therapy: Patient Spontanous Breathing and Patient connected to face mask oxygen  Post-op Pain: mild  Post-op Assessment: Post-op Vital signs reviewed, Patient's Cardiovascular Status Stable, Respiratory Function Stable, Patent Airway and No signs of Nausea or vomiting  Post-op Vital Signs: Reviewed and stable  Complications: No apparent anesthesia complications

## 2013-01-11 NOTE — Transfer of Care (Signed)
Immediate Anesthesia Transfer of Care Note  Patient: Heather Moon  Procedure(s) Performed: Procedure(s) (LRB) with comments: LAPAROSCOPIC CHOLECYSTECTOMY (N/A) - Laparoscopic cholecystectomy  Patient Location: PACU  Anesthesia Type:General  Level of Consciousness: awake and patient cooperative  Airway & Oxygen Therapy: Patient Spontanous Breathing and Patient connected to face mask oxygen  Post-op Assessment: Report given to PACU RN and Post -op Vital signs reviewed and stable  Post vital signs: Reviewed and stable  Complications: No apparent anesthesia complications

## 2013-01-11 NOTE — Op Note (Signed)
Laparoscopic Cholecystectomy Procedure Note  Indications: This patient presents with symptomatic gallbladder disease and will undergo laparoscopic cholecystectomy.  Pre-operative Diagnosis: Calculus of gallbladder without mention of cholecystitis or obstruction  Post-operative Diagnosis: Same  Surgeon: Abigail Miyamoto A   Assistants: 0  Anesthesia: General endotracheal anesthesia  ASA Class: 1  Procedure Details  The patient was seen again in the Holding Room. The risks, benefits, complications, treatment options, and expected outcomes were discussed with the patient. The possibilities of reaction to medication, pulmonary aspiration, perforation of viscus, bleeding, recurrent infection, finding a normal gallbladder, the need for additional procedures, failure to diagnose a condition, the possible need to convert to an open procedure, and creating a complication requiring transfusion or operation were discussed with the patient. The likelihood of improving the patient's symptoms with return to their baseline status is good.  The patient and/or family concurred with the proposed plan, giving informed consent. The site of surgery properly noted. The patient was taken to Operating Room, identified as Heather Moon and the procedure verified as Laparoscopic Cholecystectomy with Intraoperative Cholangiogram. A Time Out was held and the above information confirmed.  Prior to the induction of general anesthesia, antibiotic prophylaxis was administered. General endotracheal anesthesia was then administered and tolerated well. After the induction, the abdomen was prepped with Chloraprep and draped in sterile fashion. The patient was positioned in the supine position.  Local anesthetic agent was injected into the skin near the umbilicus and an incision made. We dissected down to the abdominal fascia with blunt dissection.  The fascia was incised vertically and we entered the peritoneal cavity bluntly.   A pursestring suture of 0-Vicryl was placed around the fascial opening.  The Hasson cannula was inserted and secured with the stay suture.  Pneumoperitoneum was then created with CO2 and tolerated well without any adverse changes in the patient's vital signs. An 11-mm port was placed in the subxiphoid position.  Two 5-mm ports were placed in the right upper quadrant. All skin incisions were infiltrated with a local anesthetic agent before making the incision and placing the trocars.   We positioned the patient in reverse Trendelenburg, tilted slightly to the patient's left.  The gallbladder was identified, the fundus grasped and retracted cephalad. Adhesions were lysed bluntly and with the electrocautery where indicated, taking care not to injure any adjacent organs or viscus. The infundibulum was grasped and retracted laterally, exposing the peritoneum overlying the triangle of Calot. This was then divided and exposed in a blunt fashion. The cystic duct was clearly identified and bluntly dissected circumferentially. A critical view of the cystic duct and cystic artery was obtained.  The cystic duct was then ligated with clips and divided. The cystic artery was, dissected free, ligated with clips and divided as well.   The gallbladder was dissected from the liver bed in retrograde fashion with the electrocautery. The gallbladder was removed and placed in an Endocatch sac. The liver bed was irrigated and inspected. Hemostasis was achieved with the electrocautery. Copious irrigation was utilized and was repeatedly aspirated until clear.  The gallbladder and Endocatch sac were then removed through the umbilical port site.  The pursestring suture was used to close the umbilical fascia.    We again inspected the right upper quadrant for hemostasis.  Pneumoperitoneum was released as we removed the trocars.  4-0 Monocryl was used to close the skin.   Benzoin, steri-strips, and clean dressings were applied. The  patient was then extubated and brought to the recovery room  in stable condition. Instrument, sponge, and needle counts were correct at closure and at the conclusion of the case.   Findings: Cholecystitis with Cholelithiasis  Estimated Blood Loss: Minimal         Drains: 0         Specimens: Gallbladder           Complications: None; patient tolerated the procedure well.         Disposition: PACU - hemodynamically stable.         Condition: stable

## 2013-01-11 NOTE — Interval H&P Note (Signed)
History and Physical Interval Note: no change in H and P  01/11/2013 10:33 AM  Heather Moon  has presented today for surgery, with the diagnosis of gallstones  The various methods of treatment have been discussed with the patient and family. After consideration of risks, benefits and other options for treatment, the patient has consented to  Procedure(s) (LRB) with comments: LAPAROSCOPIC CHOLECYSTECTOMY (N/A) - Laparoscopic cholecystectomy possible IOC as a surgical intervention .  The patient's history has been reviewed, patient examined, no change in status, stable for surgery.  I have reviewed the patient's chart and labs.  Questions were answered to the patient's satisfaction.     Theodor Mustin A

## 2013-01-12 ENCOUNTER — Telehealth (INDEPENDENT_AMBULATORY_CARE_PROVIDER_SITE_OTHER): Payer: Self-pay | Admitting: General Surgery

## 2013-01-12 ENCOUNTER — Encounter (HOSPITAL_BASED_OUTPATIENT_CLINIC_OR_DEPARTMENT_OTHER): Payer: Self-pay | Admitting: Surgery

## 2013-01-12 NOTE — Telephone Encounter (Signed)
Ms. Heather Moon walked into the office at 1345.  C/o pain, swelling and itching with pain medication.  I assessed Ms. Heather Moon and observed incision sites clean dr and intact.  No drainage, redness or warmth at incision areas.  Abd area is slightly swollen and firm to touch.  Educated patient about lap surgery and abd distention from the air.  Advised patient all this was normal.  Pt does state she did pick up the Ultram Rx and will take it rather than the Percocet.  Pt c/o no BM.  Advised pt narcotics could slow motility and hender a BM.  Advised pt to increase fluids and to try not to take narcotics.  Pt agreed with plan.  Had post op appt on 01/28/13 with DB.  Pt left office with all questions answered and will call the office if she has any further questions.  Marlowe Aschoff, RN

## 2013-01-12 NOTE — Telephone Encounter (Signed)
I called Ultram to [facility] and instructed pt to purchase Benadryl also in case Ultram is not effective/ [phone]/gy/pt notified/gy

## 2013-01-12 NOTE — Telephone Encounter (Signed)
Ms Heather Moon called to report that she had gallbladder surgery yesterday. She is having abdominal pain and pulling sensation on right side, after taking Percocet she is having itching all over her body, no hives or rash reported/ She is allergic to Macrobid, Doxycycline, Hydrocodone--Nausea/ Is there something else she can use?/ [phone]/[id number] /gy

## 2013-01-12 NOTE — Telephone Encounter (Signed)
I called MS Allena Heather Moon to notify her that I called Ultram 50mg  #25 with 1 refill to [facility]/ [phone]/ also old her she could try the Percocet with Benadryl if Ultram was not effective.gy

## 2013-01-12 NOTE — Telephone Encounter (Signed)
You can call in ultram 50 mg #25 with one refill.  I to 2 po q 4 hrs prn or she could take benadryl

## 2013-01-16 ENCOUNTER — Telehealth (INDEPENDENT_AMBULATORY_CARE_PROVIDER_SITE_OTHER): Payer: Self-pay | Admitting: General Surgery

## 2013-01-16 NOTE — Telephone Encounter (Signed)
She called at  8:25 pm. She is status post laparoscopic cholecystectomy on 01/11/13 and has pain and swelling around the umbilical area-no redness.  She was taking Percocet but this was causing constipation.  She was switched to Tramadol which does not work as well for the pain and she still has not had a BM. Applying ice to the area helps with the pain.  I encouraged her to try some Advil and only use the Percocet as a last resort.  She has ongoing problems with constipation and has used enemas in the past.  I suggested she go ahead and try the enemas.

## 2013-01-19 ENCOUNTER — Telehealth (INDEPENDENT_AMBULATORY_CARE_PROVIDER_SITE_OTHER): Payer: Self-pay | Admitting: *Deleted

## 2013-01-19 NOTE — Telephone Encounter (Signed)
Patient called concerned about pain management.  Patient states the Percocet 7.5/325mg  are helping with the pain but she is completely out.  Patient is requesting a refill on this prescription.  Patient also asked advice regarding sex; advised patient we suggest waiting 6 weeks for healing.  Patient states she has been taking Advil x4pills every 4-6 hours; patient advised that if she takes Advil 800mg  then she can only take it every 8 hours.  Patient states understanding at this time time.  Advised patient I would ask about refill but unsure at this time; explained I would call patient back in the morning regarding refill.

## 2013-01-20 ENCOUNTER — Telehealth (INDEPENDENT_AMBULATORY_CARE_PROVIDER_SITE_OTHER): Payer: Self-pay

## 2013-01-20 NOTE — Telephone Encounter (Signed)
The pt called back to inquire about her refill request.  She called yesterday.  She had her gallbladder removed last week.  She hasn't had a normal bm in a long time.  She has to do an enema to have one.  This has been a problem for about 2 years since her hysterectomy.  I asked her to drink plenty of liquids.  She is on Colace.  She has tried Miralax and it doesn't help.  She took it every day.  She is afraid to go to a GI specialist because they will tell her she has a blockage and needs surgery and she doesn't want that.  She has an appointment next week with Dr Magnus Ivan.  I offered for her to come in sooner.  She feels Dr Magnus Ivan will tell her the same thing he told her before.  I told her I will get her RX written and put it up front for pick up.  Dr Corliss Skains wrote Percocet 7.5/ 325 one po  6 hrs prn #25 no refill.  I also put in some information about getting to good bowel health in her envelope.

## 2013-01-20 NOTE — Telephone Encounter (Signed)
If someone in the office will write it, she can have one more script for percocet 7.5/325.  #25

## 2013-01-28 ENCOUNTER — Encounter (INDEPENDENT_AMBULATORY_CARE_PROVIDER_SITE_OTHER): Payer: Medicaid Other | Admitting: Surgery

## 2013-02-01 ENCOUNTER — Encounter (INDEPENDENT_AMBULATORY_CARE_PROVIDER_SITE_OTHER): Payer: Self-pay | Admitting: General Surgery

## 2013-02-08 ENCOUNTER — Encounter (INDEPENDENT_AMBULATORY_CARE_PROVIDER_SITE_OTHER): Payer: Self-pay | Admitting: Surgery

## 2013-02-08 ENCOUNTER — Ambulatory Visit (INDEPENDENT_AMBULATORY_CARE_PROVIDER_SITE_OTHER): Payer: Medicaid Other | Admitting: Surgery

## 2013-02-08 VITALS — BP 120/77 | HR 68 | Temp 97.6°F | Resp 14 | Ht 64.0 in | Wt 125.0 lb

## 2013-02-08 DIAGNOSIS — Z09 Encounter for follow-up examination after completed treatment for conditions other than malignant neoplasm: Secondary | ICD-10-CM

## 2013-02-08 NOTE — Progress Notes (Signed)
Subjective:     Patient ID: Heather Moon, female   DOB: [birth year], 32 y.o.   MRN: [id number]  HPI She is here for postop visit status post laparoscopic cholecystectomy done back in January. Her abdominal standpoint, she is doing well. She still has migraines and back pain. She is otherwise eating well her bowels well.  Review of Systems     Objective:   Physical ExamShe is well in appearance On exam, her incisions are well healed and her abdomen is soft and nontender. The final pathology showed chronic cholecystitis with gallstones    Assessment:     Patient stable postop     Plan:     I do not believe her migraines or upper back pain have anything to do with her gallbladder or her surgery. She may return to normal activity. I will see her back as needed

## 2013-02-10 ENCOUNTER — Emergency Department (HOSPITAL_COMMUNITY)
Admission: EM | Admit: 2013-02-10 | Discharge: 2013-02-11 | Disposition: A | Discharge status: Home / Self Care | Payer: Medicaid Other | Attending: Emergency Medicine | Admitting: Emergency Medicine

## 2013-02-10 ENCOUNTER — Encounter (HOSPITAL_COMMUNITY): Payer: Self-pay | Admitting: *Deleted

## 2013-02-10 DIAGNOSIS — Z8719 Personal history of other diseases of the digestive system: Secondary | ICD-10-CM | POA: Insufficient documentation

## 2013-02-10 DIAGNOSIS — Z87448 Personal history of other diseases of urinary system: Secondary | ICD-10-CM | POA: Insufficient documentation

## 2013-02-10 DIAGNOSIS — Z79899 Other long term (current) drug therapy: Secondary | ICD-10-CM | POA: Insufficient documentation

## 2013-02-10 DIAGNOSIS — R55 Syncope and collapse: Secondary | ICD-10-CM | POA: Insufficient documentation

## 2013-02-10 DIAGNOSIS — K219 Gastro-esophageal reflux disease without esophagitis: Secondary | ICD-10-CM | POA: Insufficient documentation

## 2013-02-10 DIAGNOSIS — G43909 Migraine, unspecified, not intractable, without status migrainosus: Secondary | ICD-10-CM | POA: Insufficient documentation

## 2013-02-10 DIAGNOSIS — R1110 Vomiting, unspecified: Secondary | ICD-10-CM | POA: Insufficient documentation

## 2013-02-10 DIAGNOSIS — R42 Dizziness and giddiness: Secondary | ICD-10-CM | POA: Insufficient documentation

## 2013-02-10 LAB — CBC WITH DIFFERENTIAL/PLATELET
Basophils Relative: 1 % (ref 0–1)
Eosinophils Absolute: 0.2 10*3/uL (ref 0.0–0.7)
HCT: 39 % (ref 36.0–46.0)
Hemoglobin: 13.4 g/dL (ref 12.0–15.0)
MCH: 32.2 pg (ref 26.0–34.0)
MCHC: 34.4 g/dL (ref 30.0–36.0)
MCV: 93.8 fL (ref 78.0–100.0)
Monocytes Absolute: 0.4 10*3/uL (ref 0.1–1.0)
Monocytes Relative: 5 % (ref 3–12)

## 2013-02-10 LAB — COMPREHENSIVE METABOLIC PANEL
Albumin: 3.6 g/dL (ref 3.5–5.2)
BUN: 9 mg/dL (ref 6–23)
Chloride: 105 mEq/L (ref 96–112)
Creatinine, Ser: 1.22 mg/dL — ABNORMAL HIGH (ref 0.50–1.10)
Total Bilirubin: 0.3 mg/dL (ref 0.3–1.2)
Total Protein: 6.7 g/dL (ref 6.0–8.3)

## 2013-02-10 LAB — TROPONIN I: Troponin I: <0.3 ng/mL (ref <0.30)

## 2013-02-10 NOTE — ED Notes (Addendum)
Pt with hx of chronic back pain.  She had cholecystectomy on 01/11/13 to help alleviate the pain.  Originally the pain improved, but pt is now experiencing pain b/w her shoulder blades that radiates to both shoulders and up back of neck.  Pt states since back pains have been increasing, it has been triggering her migraine.  Pt was kneeling over the toiled vomiting and blacked out.  She fell backwards and woke up immediately when her children ran from the other room.  Pt ao x 4 presently, talking constantly.  PERRL.

## 2013-02-11 MED ORDER — DIPHENHYDRAMINE HCL 50 MG/ML IJ SOLN
25.0000 mg | Freq: Once | INTRAMUSCULAR | Status: AC
  Administered 2013-02-11: 25 mg via INTRAMUSCULAR
  Filled 2013-02-11: qty 1

## 2013-02-11 MED ORDER — MORPHINE SULFATE 4 MG/ML IJ SOLN
4.0000 mg | Freq: Once | INTRAMUSCULAR | Status: AC
  Administered 2013-02-11: 4 mg via INTRAMUSCULAR
  Filled 2013-02-11: qty 1

## 2013-02-11 MED ORDER — METOCLOPRAMIDE HCL 5 MG/ML IJ SOLN
10.0000 mg | Freq: Once | INTRAMUSCULAR | Status: AC
  Administered 2013-02-11: 10 mg via INTRAMUSCULAR
  Filled 2013-02-11: qty 2

## 2013-02-11 NOTE — ED Notes (Signed)
Pt requesting pain medications Dr. Freida Busman made aware

## 2013-02-11 NOTE — ED Provider Notes (Addendum)
History     CSN: [id number]  Arrival date & time 02/10/13  4098   First MD Initiated Contact with Patient 02/11/13 0020      No chief complaint on file.   (Consider location/radiation/quality/duration/timing/severity/associated sxs/prior treatment) The history is provided by the patient.   Patient here complaining of migraine that resulted in her having a brief syncopal event. States that she gets daily migraines and are located at her temples. She was kneeling on the toilet and vomiting when she suddenly became dizzy lightheaded and fell backwards onto a heavily carpeted area. She says that she didn't lose complete consciousness. No neck pain or head trauma. Denies any chest or abdominal pain. This current migraine is similar to her prior ones. No treatment used prior to arrival nothing makes her symptoms worse. Denies any fever or photophobia Past Medical History  Diagnosis Date  . Migraine   . Blood transfusion without reported diagnosis   . GERD (gastroesophageal reflux disease)     takes Prilosec as needed  . Cholelithiasis   . Constipation, chronic   . 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 Renal artery stent    . Abdominal hysterectomy  08/27/2010  . Cholecystectomy  01/11/2013    Procedure: LAPAROSCOPIC CHOLECYSTECTOMY;  Surgeon: Shelly Rubenstein, MD;  Location: [facility];  Service: General;  Laterality: N/A;  Laparoscopic cholecystectomy  . Eye surgery      at 6mos old    Family History  Problem Relation Age of Onset  . Cancer Maternal Grandmother   . Cirrhosis Maternal Grandfather     History  Substance Use Topics  . Smoking status: Never Smoker   . Smokeless tobacco: Not on file  . Alcohol Use: No    OB History   Grav Para Term Preterm Abortions TAB SAB Ect Mult Living                  Review of Systems  All other systems reviewed and are negative.    Allergies   Doxycycline; Hydrocodone; and Macrolides and ketolides  Home Medications   Current Outpatient Rx  Name  Route  Sig  Dispense  Refill  . docusate sodium (COLACE) 100 MG capsule   Oral   Take 100 mg by mouth 2 (two) times daily.         Marland Kitchen ibuprofen (ADVIL,MOTRIN) 200 MG tablet   Oral   Take 800 mg by mouth every 6 (six) hours as needed for pain. For pain.         Marland Kitchen omeprazole (PRILOSEC) 20 MG capsule   Oral   Take 20 mg by mouth daily as needed.          Marland Kitchen oxyCODONE-acetaminophen (PERCOCET) 7.5-325 MG per tablet   Oral   Take 2 tablets by mouth every 4 (four) hours as needed for pain.   40 tablet   0     BP 97/61  Pulse 50  Temp(Src) 98 F (36.7 C) (Oral)  Resp 16  SpO2 100%  Physical Exam  Nursing note and vitals reviewed. Constitutional: She is oriented to person, place, and time. She appears well-developed and well-nourished.  Non-toxic appearance. No distress.  HENT:  Head: Normocephalic and atraumatic.  Eyes: Conjunctivae, EOM and lids are normal. Pupils are equal, round, and reactive to light.  Neck: Normal range of motion. Neck supple. No tracheal deviation present.  No mass present.  Cardiovascular: Normal rate, regular rhythm and normal heart sounds.  Exam reveals no gallop.   No murmur heard. Pulmonary/Chest: Effort normal and breath sounds normal. No stridor. No respiratory distress. She has no decreased breath sounds. She has no wheezes. She has no rhonchi. She has no rales.  Abdominal: Soft. Normal appearance and bowel sounds are normal. She exhibits no distension. There is no tenderness. There is no rebound and no CVA tenderness.  Musculoskeletal: Normal range of motion. She exhibits no edema and no tenderness.  Neurological: She is alert and oriented to person, place, and time. She has normal strength. No cranial nerve deficit or sensory deficit. GCS eye subscore is 4. GCS verbal subscore is 5. GCS motor subscore is 6.  Skin: Skin is warm and dry. No  abrasion and no rash noted.  Psychiatric: She has a normal mood and affect. Her speech is normal and behavior is normal.    ED Course  Procedures (including critical care time)  Labs Reviewed  COMPREHENSIVE METABOLIC PANEL - Abnormal; Notable for the following:    Creatinine, Ser 1.22 (*)    GFR calc non Af Amer 58 (*)    GFR calc Af Amer 67 (*)    All other components within normal limits  CBC WITH DIFFERENTIAL  TROPONIN I   No results found.   No diagnosis found.    MDM     Rate: 68   Rhythm: normal sinus rhythm  QRS Axis: normal  Intervals: normal  ST/T Wave abnormalities: normal  Conduction Disutrbances:none  Narrative Interpretation:   Old EKG Reviewed: none available   2:08 AM Patient given medications for her migraine headache and feels better. Repeat neurological exam is stable. Suspect that she had a vagal episode.     Toy Baker, MD 02/11/13 1610  Toy Baker, MD 02/11/13 [phone]

## 2013-02-13 ENCOUNTER — Encounter: Payer: Self-pay | Admitting: *Deleted

## 2013-02-18 ENCOUNTER — Other Ambulatory Visit: Payer: Self-pay | Admitting: Family Medicine

## 2013-02-18 ENCOUNTER — Ambulatory Visit (HOSPITAL_COMMUNITY)
Admission: RE | Admit: 2013-02-18 | Discharge: 2013-02-18 | Disposition: A | Discharge status: Home / Self Care | Payer: Medicaid Other | Source: Ambulatory Visit | Attending: Family Medicine | Admitting: Family Medicine

## 2013-02-18 DIAGNOSIS — M542 Cervicalgia: Secondary | ICD-10-CM

## 2013-02-18 DIAGNOSIS — M25519 Pain in unspecified shoulder: Secondary | ICD-10-CM | POA: Insufficient documentation

## 2013-02-24 ENCOUNTER — Encounter (INDEPENDENT_AMBULATORY_CARE_PROVIDER_SITE_OTHER): Payer: Medicaid Other | Admitting: Surgery

## 2013-02-25 ENCOUNTER — Encounter (INDEPENDENT_AMBULATORY_CARE_PROVIDER_SITE_OTHER): Payer: Medicaid Other | Admitting: Surgery

## 2013-02-27 ENCOUNTER — Encounter: Payer: Self-pay | Admitting: *Deleted

## 2013-03-01 ENCOUNTER — Encounter (INDEPENDENT_AMBULATORY_CARE_PROVIDER_SITE_OTHER): Payer: Medicaid Other | Admitting: Surgery

## 2013-03-01 ENCOUNTER — Encounter (HOSPITAL_COMMUNITY): Payer: Self-pay | Admitting: Emergency Medicine

## 2013-03-01 ENCOUNTER — Emergency Department (HOSPITAL_COMMUNITY): Payer: Medicaid Other

## 2013-03-01 ENCOUNTER — Emergency Department (HOSPITAL_COMMUNITY)
Admission: EM | Admit: 2013-03-01 | Discharge: 2013-03-01 | Disposition: A | Discharge status: Home / Self Care | Payer: Medicaid Other | Attending: Emergency Medicine | Admitting: Emergency Medicine

## 2013-03-01 DIAGNOSIS — Z8679 Personal history of other diseases of the circulatory system: Secondary | ICD-10-CM | POA: Insufficient documentation

## 2013-03-01 DIAGNOSIS — K5900 Constipation, unspecified: Secondary | ICD-10-CM | POA: Insufficient documentation

## 2013-03-01 DIAGNOSIS — Z8719 Personal history of other diseases of the digestive system: Secondary | ICD-10-CM | POA: Insufficient documentation

## 2013-03-01 DIAGNOSIS — IMO0002 Reserved for concepts with insufficient information to code with codable children: Secondary | ICD-10-CM | POA: Insufficient documentation

## 2013-03-01 DIAGNOSIS — K219 Gastro-esophageal reflux disease without esophagitis: Secondary | ICD-10-CM | POA: Insufficient documentation

## 2013-03-01 DIAGNOSIS — M5414 Radiculopathy, thoracic region: Secondary | ICD-10-CM

## 2013-03-01 DIAGNOSIS — R209 Unspecified disturbances of skin sensation: Secondary | ICD-10-CM | POA: Insufficient documentation

## 2013-03-01 DIAGNOSIS — N289 Disorder of kidney and ureter, unspecified: Secondary | ICD-10-CM | POA: Insufficient documentation

## 2013-03-01 MED ORDER — OXYCODONE-ACETAMINOPHEN 10-325 MG PO TABS: 1.0000 | ORAL_TABLET | ORAL | Status: DC | PRN

## 2013-03-01 MED ORDER — PREDNISONE 50 MG PO TABS: 50.0000 mg | ORAL_TABLET | Freq: Every day | ORAL | Status: DC

## 2013-03-01 MED ORDER — MORPHINE SULFATE 4 MG/ML IJ SOLN
4.0000 mg | Freq: Once | INTRAMUSCULAR | Status: AC
  Administered 2013-03-01: 4 mg via INTRAMUSCULAR
  Filled 2013-03-01: qty 1

## 2013-03-01 MED ORDER — CYCLOBENZAPRINE HCL 10 MG PO TABS: 10.0000 mg | ORAL_TABLET | Freq: Three times a day (TID) | ORAL | Status: DC | PRN

## 2013-03-01 MED ORDER — KETOROLAC TROMETHAMINE 60 MG/2ML IM SOLN
60.0000 mg | Freq: Once | INTRAMUSCULAR | Status: AC
  Administered 2013-03-01: 60 mg via INTRAMUSCULAR
  Filled 2013-03-01: qty 2

## 2013-03-01 NOTE — ED Notes (Signed)
Pt went to peds with daughter

## 2013-03-01 NOTE — ED Provider Notes (Signed)
History     CSN: [id number]  Arrival date & time 03/01/13  1228   First MD Initiated Contact with Patient 03/01/13 1350      Chief Complaint  Patient presents with  . Back Pain    (Consider location/radiation/quality/duration/timing/severity/associated sxs/prior treatment) HPI The patient presents with Mid back pain that has been ongoing for about 2 weeks. The patient has been seeing her PCP for this issue. The patient states that she gets tingling in her L arm and hand. The patient denies fever, nausea, vomiting, or dysuria. The patient states that her pain medications are not working. Past Medical History  Diagnosis Date  . Migraine   . Blood transfusion without reported diagnosis   . GERD (gastroesophageal reflux disease)     takes Prilosec as needed  . Cholelithiasis   . Constipation, chronic   . 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 Migraine headache   . Chronic insomnia     Past Surgical History  Procedure Laterality Date  . Renal artery stent    . Abdominal hysterectomy  08/27/2010  . Cholecystectomy  01/11/2013    Procedure: LAPAROSCOPIC CHOLECYSTECTOMY;  Surgeon: Shelly Rubenstein, MD;  Location: [facility];  Service: General;  Laterality: N/A;  Laparoscopic cholecystectomy  . Eye surgery      at 6mos old    Family History  Problem Relation Age of Onset  . Cancer Maternal Grandmother   . Cirrhosis Maternal Grandfather     History  Substance Use Topics  . Smoking status: Never Smoker   . Smokeless tobacco: Not on file  . Alcohol Use: No    OB History   Grav Para Term Preterm Abortions TAB SAB Ect Mult Living                  Review of Systems All other systems negative except as documented in the HPI. All pertinent positives and negatives as reviewed in the HPI.  Allergies  Doxycycline; Hydrocodone; Macrolides and ketolides; and Trazodone and nefazodone  Home Medications   Current Outpatient Rx  Name  Route  Sig   Dispense  Refill  . oxyCODONE-acetaminophen (PERCOCET/ROXICET) 5-325 MG per tablet   Oral   Take 1 tablet by mouth every 6 (six) hours as needed for pain.           BP 121/74  Pulse 90  Temp(Src) 99.1 F (37.3 C) (Oral)  Resp 18  SpO2 100%  Physical Exam  Nursing note and vitals reviewed. Constitutional: She appears well-developed and well-nourished.  HENT:  Head: Normocephalic and atraumatic.  Eyes: Pupils are equal, round, and reactive to light.  Cardiovascular: Normal rate, regular rhythm and normal heart sounds.   Pulmonary/Chest: Effort normal and breath sounds normal.  Musculoskeletal:       Back:  Skin: Skin is warm and dry.    ED Course  Procedures (including critical care time)  The patient will be referred to neurosurgery. Told to return here as needed. Ice and heat to her back.   MDM          Carlyle Dolly, PA-C 03/01/13 1553

## 2013-03-01 NOTE — ED Provider Notes (Signed)
Medical screening examination/treatment/procedure(s) were performed by non-physician practitioner and as supervising physician I was immediately available for consultation/collaboration.  Chalene Treu, MD 03/01/13 1801 

## 2013-03-01 NOTE — ED Notes (Signed)
C/o chronic back pain and tingling in left arm for several weeks, worse after bending over, no relief with OTC meds, ambulatory, A/O, NAD

## 2013-03-04 ENCOUNTER — Ambulatory Visit (INDEPENDENT_AMBULATORY_CARE_PROVIDER_SITE_OTHER): Payer: Medicaid Other | Admitting: Nurse Practitioner

## 2013-03-04 ENCOUNTER — Encounter: Payer: Self-pay | Admitting: Nurse Practitioner

## 2013-03-04 VITALS — BP 106/82 | HR 70 | Wt 128.0 lb

## 2013-03-04 DIAGNOSIS — M25519 Pain in unspecified shoulder: Secondary | ICD-10-CM

## 2013-03-04 DIAGNOSIS — G43009 Migraine without aura, not intractable, without status migrainosus: Secondary | ICD-10-CM

## 2013-03-04 DIAGNOSIS — M25512 Pain in left shoulder: Secondary | ICD-10-CM

## 2013-03-04 DIAGNOSIS — M62838 Other muscle spasm: Secondary | ICD-10-CM

## 2013-03-04 MED ORDER — OXYCODONE-ACETAMINOPHEN 10-325 MG PO TABS: 1.0000 | ORAL_TABLET | ORAL | Status: DC | PRN

## 2013-03-05 ENCOUNTER — Encounter: Payer: Self-pay | Admitting: Nurse Practitioner

## 2013-03-05 DIAGNOSIS — M25512 Pain in left shoulder: Secondary | ICD-10-CM | POA: Insufficient documentation

## 2013-03-05 DIAGNOSIS — M62838 Other muscle spasm: Secondary | ICD-10-CM | POA: Insufficient documentation

## 2013-03-05 DIAGNOSIS — G43009 Migraine without aura, not intractable, without status migrainosus: Secondary | ICD-10-CM | POA: Insufficient documentation

## 2013-03-05 NOTE — Assessment & Plan Note (Signed)
Patient is getting work-up with orthopedic doctor for neck and shoulder pain next week.  Noticed an increase in number of migraines with muscle spasms

## 2013-03-05 NOTE — Progress Notes (Signed)
Subjective: Presents for recheck. Continues to have upper back and neck pain. Now having significant pain in the left shoulder joint. Some popping. 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 Has an appointment with orthopedic specialist next week. Noticed an increase in the number of her migraines once she started having the muscle spasms. Objective: NAD. Alert, oriented. Lungs clear. Heart regular rate rhythm. Very tight tender muscles noted along the upper back area particularly on the left side. Tenderness in the a.c. portion of the left shoulder joint. Can perform ROM but with tenderness and popping noted in the left shoulder. Hand and arm strength 5+ bilateral. Assessment: #1 persistent muscle spasms of the neck and upper back area #2 left shoulder pain #3 migraine with aura exacerbation most likely secondary to #1 Plan: #1 followup with orthopedic specialist as planned. Continue Percocet as directed, use sparingly. Patient to followup with [place] if migraines persist.

## 2013-03-10 ENCOUNTER — Encounter: Payer: Self-pay | Admitting: Orthopedic Surgery

## 2013-03-10 ENCOUNTER — Ambulatory Visit (INDEPENDENT_AMBULATORY_CARE_PROVIDER_SITE_OTHER): Payer: Medicaid Other | Admitting: Orthopedic Surgery

## 2013-03-10 ENCOUNTER — Telehealth: Payer: Self-pay | Admitting: Nurse Practitioner

## 2013-03-10 ENCOUNTER — Other Ambulatory Visit: Payer: Self-pay | Admitting: *Deleted

## 2013-03-10 ENCOUNTER — Encounter (HOSPITAL_COMMUNITY): Payer: Self-pay | Admitting: *Deleted

## 2013-03-10 ENCOUNTER — Emergency Department (HOSPITAL_COMMUNITY)
Admission: EM | Admit: 2013-03-10 | Discharge: 2013-03-10 | Disposition: A | Discharge status: Home / Self Care | Payer: Medicaid Other | Attending: Emergency Medicine | Admitting: Emergency Medicine

## 2013-03-10 VITALS — BP 110/80 | Ht 64.0 in | Wt 127.0 lb

## 2013-03-10 DIAGNOSIS — IMO0002 Reserved for concepts with insufficient information to code with codable children: Secondary | ICD-10-CM | POA: Insufficient documentation

## 2013-03-10 DIAGNOSIS — Z8679 Personal history of other diseases of the circulatory system: Secondary | ICD-10-CM | POA: Insufficient documentation

## 2013-03-10 DIAGNOSIS — Z87448 Personal history of other diseases of urinary system: Secondary | ICD-10-CM | POA: Insufficient documentation

## 2013-03-10 DIAGNOSIS — M25512 Pain in left shoulder: Secondary | ICD-10-CM

## 2013-03-10 DIAGNOSIS — Z8719 Personal history of other diseases of the digestive system: Secondary | ICD-10-CM | POA: Insufficient documentation

## 2013-03-10 DIAGNOSIS — G8929 Other chronic pain: Secondary | ICD-10-CM

## 2013-03-10 DIAGNOSIS — M25519 Pain in unspecified shoulder: Secondary | ICD-10-CM | POA: Insufficient documentation

## 2013-03-10 DIAGNOSIS — M7542 Impingement syndrome of left shoulder: Secondary | ICD-10-CM

## 2013-03-10 DIAGNOSIS — M542 Cervicalgia: Secondary | ICD-10-CM

## 2013-03-10 DIAGNOSIS — M47812 Spondylosis without myelopathy or radiculopathy, cervical region: Secondary | ICD-10-CM

## 2013-03-10 MED ORDER — KETOROLAC TROMETHAMINE 60 MG/2ML IM SOLN
60.0000 mg | Freq: Once | INTRAMUSCULAR | Status: AC
  Administered 2013-03-10: 60 mg via INTRAMUSCULAR
  Filled 2013-03-10: qty 2

## 2013-03-10 NOTE — ED Notes (Signed)
Pt was at her orthopedic MD today and states that he had her moving her L shoulder in all directions and caused her great pain.  She states the MD told her there's nothing more he can do for her, that she has to go to her pcp for more pain meds and that she needs to see phys therapist who she cannot see until Mon.  Pt is here for help for pain meds (needs refill for pain meds) b/c her arm is tingling and she is experiencing stabbing pain.

## 2013-03-10 NOTE — ED Provider Notes (Signed)
History    This chart was scribed for non-physician practitioner Dierdre Forth, PA-C working with Joya Gaskins, MD by Gerlean Ren, ED Scribe. This patient was seen in room TR07C/TR07C and the patient's care was started at 5:26 PM.    CSN: [id number]  Arrival date & time 03/10/13  1539   First MD Initiated Contact with Patient 03/10/13 1652      Chief Complaint  Patient presents with  . Arm Pain     The history is provided by the patient. No language interpreter was used.  Heather Moon is a 32 y.o. female who presents to the Emergency Department complaining of stabbing chronic left shoulder pain that radiates into left trapezius region and into left side neck that began worsening today after seeing orthopedist where she had an exam involving full ROM that pt states severely aggravated pain.  Chronic pain has been present for 2 years.  Pt states orthopedist told her that she is not a candidate for surgical intervention and that her PCP needs to manage her pain medication.  Pt states she is here for an MRI and pain medication prescription.  PCP is Dr. Lilyan Punt.  Past Medical History  Diagnosis Date  . Migraine   . Blood transfusion without reported diagnosis   . GERD (gastroesophageal reflux disease)     takes Prilosec as needed  . Cholelithiasis   . Constipation, chronic   . 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 Migraine headache   . Chronic insomnia     Past Surgical History  Procedure Laterality Date  . Renal artery stent    . Abdominal hysterectomy  08/27/2010  . Cholecystectomy  01/11/2013    Procedure: LAPAROSCOPIC CHOLECYSTECTOMY;  Surgeon: Shelly Rubenstein, MD;  Location: [facility];  Service: General;  Laterality: N/A;  Laparoscopic cholecystectomy  . Eye surgery      at 6mos old    Family History  Problem Relation Age of Onset  . Cancer Maternal Grandmother   . Cirrhosis Maternal Grandfather     History  Substance  Use Topics  . Smoking status: Never Smoker   . Smokeless tobacco: Not on file  . Alcohol Use: No    No OB history provided.   Review of Systems  Constitutional: Negative for fever and chills.  Musculoskeletal: Positive for arthralgias.  Skin: Negative for wound.  Neurological: Negative for weakness and numbness.  All other systems reviewed and are negative.    Allergies  Doxycycline; Hydrocodone; Macrolides and ketolides; and Trazodone and nefazodone  Home Medications   Current Outpatient Rx  Name  Route  Sig  Dispense  Refill  . cyclobenzaprine (FLEXERIL) 10 MG tablet   Oral   Take 10 mg by mouth 3 (three) times daily as needed for muscle spasms.         Marland Kitchen oxyCODONE-acetaminophen (PERCOCET) 10-325 MG per tablet   Oral   Take 1 tablet by mouth every 4 (four) hours as needed for pain.         . predniSONE (DELTASONE) 50 MG tablet   Oral   Take 50 mg by mouth daily.           BP 95/76  Pulse 70  Temp(Src) 98.4 F (36.9 C) (Oral)  Resp 16  SpO2 100%  Physical Exam  Nursing note and vitals reviewed. Constitutional: She is oriented to person, place, and time. She appears well-developed and well-nourished. No distress.  HENT:  Head: Normocephalic and atraumatic.  Mouth/Throat: Oropharynx is clear and moist.  Eyes: EOM are normal. Pupils are equal, round, and reactive to light. No scleral icterus.  Neck: Normal range of motion and full passive range of motion without pain. Neck supple. No muscular tenderness present. No tracheal deviation and normal range of motion present.    Cardiovascular: Normal rate, normal heart sounds and intact distal pulses.   Pulses intact, cap refill < 3 seconds  Pulmonary/Chest: Effort normal. No respiratory distress.  Musculoskeletal:       Left shoulder: She exhibits decreased range of motion (2/2 pain and poor effort), tenderness (trapezius) and pain. She exhibits no bony tenderness, no swelling, no effusion, no crepitus, no  deformity, no laceration, no spasm, normal pulse and normal strength.  Full ROM of left shoulder Mild pain to palpation of the left trapezius  Lymphadenopathy:    She has no cervical adenopathy.  Neurological: She is alert and oriented to person, place, and time.  5/5 strength in bilateral upper extremities, good sensation  Skin: Skin is warm and dry. No erythema.  Psychiatric: She has a normal mood and affect. Her behavior is normal.    ED Course  Procedures (including critical care time) DIAGNOSTIC STUDIES: Oxygen Saturation is 100% on room air, normal by my interpretation.    COORDINATION OF CARE: 5:32 PM- Informed pt that I will not be able to prescribe pain medication here and that her PCP needs to do that for her.  Discussed Toradol here, because pt drove here I am not able to administer narcotics.  Advised ice at home and to use prescribed Naprosyn as directed.     1. Left shoulder pain   2. Chronic pain       MDM  [name] presents with shoulder pain.  Pain managed in ED. Discussed with patient that an MRI is not an emergent procedure and she does not on file for one here in the emergency department. Also discussed with patient that chronic pain management must be managed by her primary care physician and we will be unable to write her for pain prescription here in the emergency department. Suggested that the patient decrease her lifting at work and try conservative home therapy including anti-inflammatories, ice and heat along with gentle stretching. Patient will be dc home & is agreeable with above plan.  I personally performed the services described in this documentation, which was scribed in my presence. The recorded information has been reviewed and is accurate.        Dahlia Client Mija Effertz, PA-C 03/10/13 2028

## 2013-03-10 NOTE — Progress Notes (Signed)
Patient ID: Heather Moon, female   DOB: 1981-06-10, 32 y.o.   MRN: [id number] Chief Complaint  Patient presents with  . Neck Pain    pain in left shoulder, neck and back causing numbness in arm, hand, and fingers Referred by Lilyan Punt, MD     History Location neck pain left shoulder pain  Quality stabbing pain  Severity 10 out of 10  Timing intermittent  Duration 2 years   Context , gradual onset  Factors better/worse painful for elevation of the left shoulder worse with activity  Associated symptoms numbness in the left arm and hand tingling and radiation of pain into the cervical spine  Review of systems she reports fainting heartburn frequency secondary to kidney issues numbness and tingling as described muscle pain is described. Denies weight loss weight gain fever chills fatigue blurred vision shortness of breath excessive thirst or easy bleeding  Allergy to hydrocodone and Macrobid  Medical problems kidney disease and vision issues  Past Surgical History  Procedure Laterality Date  . Renal artery stent    . Abdominal hysterectomy  08/27/2010  . Cholecystectomy  01/11/2013    Procedure: LAPAROSCOPIC CHOLECYSTECTOMY;  Surgeon: Shelly Rubenstein, MD;  [facility];  Service: General;  Laterality: N/A;  Laparoscopic cholecystectomy  . 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  The lymphatic system is negative for palpable lymph nodes  The sensory exam is normal.  There are no pathologic reflexes.  Balance is normal.   Exam of the cervical spine and shoulders Inspection she does have tenderness in her cervical spine in the midline at the base the cervical spine as well as the left trapezius muscle the surrounding shoulder joint is nontender she has decreased range of motion in the shoulder and the cervical spine and the shoulder is stable. Upper extremity strength is normal. Skin normal over the cervical spine the left and right shoulder and thoracic spine, reflexes are normal and equal  Cervical spine x-ray looked relatively benign to me the report was read out as early degenerative changes at C5 and 6 and C6 and 7  Impingement syndrome of left shoulder - Plan: Ambulatory referral to Occupational Therapy  Cervical spondylosis without myelopathy - Plan: Ambulatory referral to Occupational Therapy   She is really not a surgical candidate as she has not really had an adequate course of nonoperative treatment. I really disagree with the amount of narcotics she is taking but I will leave that up to her medical doctors to manage that. I've recommended physical therapy. She said she cannot afford a chiropractor. I really cannot offer her anything other than that and reassurance that she doesn't need any surgical intervention

## 2013-03-10 NOTE — Telephone Encounter (Signed)
Pt has shoulder pain still. She went to see dr.harrison today but he questioned why she came and did some exercises with her and she is having shoulder pain right now and she has to work [employer]. He told she would have to get  pain meds from family physician. She wants perocet 7.5

## 2013-03-11 ENCOUNTER — Telehealth: Payer: Self-pay | Admitting: Nurse Practitioner

## 2013-03-11 NOTE — Telephone Encounter (Signed)
fyi

## 2013-03-11 NOTE — Telephone Encounter (Signed)
Patient wants someone to call her regarding her Pain Medication.  She has been informed the messages will be answered as soon as the doctor can get to them.  However, She still wanted a message left for the Doctor.

## 2013-03-11 NOTE — Telephone Encounter (Signed)
Patient called today in regards to refills on pain medication.  PT has been set up for patient.  She is using pain medication more due to working now and back is hurting more due to bend and lifting.  Pain from back to shoulder into arm down to fingers on left side.  She states her left hand goes numb.  Patient went [facility] last night 03/10/2013 and got a pain shot.

## 2013-03-11 NOTE — Telephone Encounter (Signed)
Please check with her pharmacy to verify how much medication and when prescribed.  Thanks

## 2013-03-11 NOTE — ED Provider Notes (Signed)
Medical screening examination/treatment/procedure(s) were performed by non-physician practitioner and as supervising physician I was immediately available for consultation/collaboration.   Joya Gaskins, MD 03/11/13 [phone]

## 2013-03-11 NOTE — Telephone Encounter (Signed)
Pt has shoulder pain still. She went to see dr.harrison today but he questioned why she came and did some exercises with her and she is having shoulder pain right now and she has to work tonight. He told she would have to get  pain meds from family physician. She wants perocet 7.5 °

## 2013-03-11 NOTE — Telephone Encounter (Signed)
Patient received #20 percocet on 02/18/2013. [facility] [place] on [address], patient received percocet 10/325 #15  from Dr. Helene Shoe and on 03/04/13 she received percocet 10/325 #20 from our office.

## 2013-03-12 NOTE — Telephone Encounter (Signed)
Patient will need to come in for office visit before any more pain med.

## 2013-03-12 NOTE — Telephone Encounter (Signed)
Message has been answered.

## 2013-03-12 NOTE — Telephone Encounter (Signed)
Advised patient that she needs to come in for office visit before any more pain meds. Transferred to fonr desk for appointment.

## 2013-03-15 ENCOUNTER — Ambulatory Visit: Payer: Medicaid Other | Admitting: Physical Therapy

## 2013-03-15 ENCOUNTER — Other Ambulatory Visit: Payer: Self-pay | Admitting: Family Medicine

## 2013-03-16 ENCOUNTER — Ambulatory Visit (INDEPENDENT_AMBULATORY_CARE_PROVIDER_SITE_OTHER): Payer: Medicaid Other | Admitting: Family Medicine

## 2013-03-16 ENCOUNTER — Encounter: Payer: Self-pay | Admitting: Family Medicine

## 2013-03-16 VITALS — BP 108/64 | HR 60 | Wt 130.4 lb

## 2013-03-16 DIAGNOSIS — M5412 Radiculopathy, cervical region: Secondary | ICD-10-CM

## 2013-03-16 MED ORDER — NORTRIPTYLINE HCL 50 MG PO CAPS: 50.0000 mg | ORAL_CAPSULE | Freq: Every day | ORAL | Status: DC

## 2013-03-16 MED ORDER — OXYCODONE-ACETAMINOPHEN 10-325 MG PO TABS: 1.0000 | ORAL_TABLET | Freq: Four times a day (QID) | ORAL | Status: DC | PRN

## 2013-03-16 NOTE — Progress Notes (Signed)
MRI Cervical spine w/o contrast scheduled on 03/19/2013 at 3 pm. Xray left shoulder scheduled 03/19/2013 at 2 pm. Tests scheduled at [facility] per patient request.

## 2013-03-16 NOTE — Progress Notes (Signed)
  Subjective:    Patient ID: Heather Moon, female    DOB: [birth year], 32 y.o.   MRN: [id number]  Neck Pain  This is a chronic problem. The current episode started more than 1 year ago. The problem occurs constantly. The problem has been unchanged. The pain is associated with an unknown factor. The pain is present in the left side. The quality of the pain is described as stabbing. The pain is moderate. The symptoms are aggravated by position. The pain is worse during the day. Associated symptoms include headaches, numbness, tingling and weakness. She has tried ice, heat, muscle relaxants and oral narcotics for the symptoms. The treatment provided mild relief.  Back Pain Associated symptoms include headaches, numbness, tingling and weakness.   This patient's had several month history of what she thought was shoulder pain she describes it is the back of her neck radiating into the trapezius into the shoulder down the triceps and down into the hand she gets tingling into the hand as well as severe pain she has been to the ER a couple times as well as her nurse practitioner has been prescribed Percocet several different times she denies abusing it she states it helps her with her pain she is just seeking relief. She denies any fevers headaches vomiting or diarrhea.  Past medical history, family history, social history all reviewed.   Review of Systems  HENT: Positive for neck pain.   Musculoskeletal: Positive for back pain.  Neurological: Positive for tingling, weakness, numbness and headaches.       Objective:   Physical Exam Neck has decent range of motion moderate tenderness on the left side of the neck tenderness in the shoulder as well as subjective discomfort down the arm reflexes good strength is good lungs are clear heart is regular abdomen soft       Assessment & Plan:  Cervical radicular pain - Plan: MR Cervical Spine Wo Contrast, nortriptyline (PAMELOR) 50 MG capsule,  oxyCODONE-acetaminophen (PERCOCET) 10-325 MG per tablet, DG Shoulder Left given that this is been going on for a couple months and getting worse I think it is reasonable to go forward with pain medication as well as nortriptyline at nighttime and planning to do MRI of the spine I think she has a herniated disc may need referral. S. A.l.

## 2013-03-16 NOTE — Patient Instructions (Signed)
Use pain medication no greatere than three per day  Do mri, follow up to office visit

## 2013-03-19 ENCOUNTER — Ambulatory Visit (HOSPITAL_COMMUNITY)
Admission: RE | Admit: 2013-03-19 | Discharge: 2013-03-19 | Disposition: A | Discharge status: Home / Self Care | Payer: Medicaid Other | Source: Ambulatory Visit | Attending: Family Medicine | Admitting: Family Medicine

## 2013-03-19 DIAGNOSIS — R209 Unspecified disturbances of skin sensation: Secondary | ICD-10-CM | POA: Insufficient documentation

## 2013-03-19 DIAGNOSIS — M5020 Other cervical disc displacement, unspecified cervical region: Secondary | ICD-10-CM | POA: Insufficient documentation

## 2013-03-19 DIAGNOSIS — M5412 Radiculopathy, cervical region: Secondary | ICD-10-CM | POA: Insufficient documentation

## 2013-03-19 DIAGNOSIS — M5380 Other specified dorsopathies, site unspecified: Secondary | ICD-10-CM | POA: Insufficient documentation

## 2013-03-21 ENCOUNTER — Other Ambulatory Visit: Payer: Self-pay | Admitting: Family Medicine

## 2013-03-21 DIAGNOSIS — M5020 Other cervical disc displacement, unspecified cervical region: Secondary | ICD-10-CM

## 2013-03-22 ENCOUNTER — Ambulatory Visit: Payer: Medicaid Other | Attending: Orthopedic Surgery | Admitting: Physical Therapy

## 2013-03-24 ENCOUNTER — Telehealth: Payer: Self-pay | Admitting: Family Medicine

## 2013-03-24 ENCOUNTER — Encounter: Payer: Self-pay | Admitting: Family Medicine

## 2013-03-24 ENCOUNTER — Ambulatory Visit (INDEPENDENT_AMBULATORY_CARE_PROVIDER_SITE_OTHER): Payer: Medicaid Other | Admitting: Family Medicine

## 2013-03-24 VITALS — Temp 98.9°F | Ht 64.0 in | Wt 129.4 lb

## 2013-03-24 DIAGNOSIS — M5020 Other cervical disc displacement, unspecified cervical region: Secondary | ICD-10-CM

## 2013-03-24 MED ORDER — PREDNISONE 20 MG PO TABS: ORAL_TABLET | ORAL | Status: AC

## 2013-03-24 MED ORDER — OXYCODONE-ACETAMINOPHEN 10-325 MG PO TABS: 1.0000 | ORAL_TABLET | ORAL | Status: DC | PRN

## 2013-03-24 NOTE — Telephone Encounter (Signed)
Sounds great!

## 2013-03-24 NOTE — Telephone Encounter (Signed)
Pt has appt 03/26/13 @ 12:00 w/Dr. Phoebe Perch @ [facility], pt is aware

## 2013-03-24 NOTE — Progress Notes (Signed)
  Subjective:    Patient ID: Heather Moon, female    DOB: 11-30-1981, 32 y.o.   MRN: [id number]  Neck Pain  This is a recurrent problem. The current episode started more than 1 month ago. The problem occurs daily. The problem has been gradually worsening. The pain is associated with lifting a heavy object. The pain is present in the left side. The quality of the pain is described as burning, aching and shooting. The pain is at a severity of 9/10. The pain is severe. The symptoms are aggravated by bending and position. The pain is same all the time. Stiffness is present all day. Treatments tried: oxycodone 10/325. The treatment provided mild relief.   Patient also relates head congestion sinus drainage not feeling good over the past few days no high fevers children with similar illnesses no wheezing or difficulty breathing.   Review of Systems  HENT: Positive for neck pain.   see above.   past medical history family history reviewed  Objective:   Physical Exam Neck has subjective discomfort worse on the left side radiates down the arm strength is good reflexes good lungs clear heart regular sinus nontender and. Eardrums normal throat is normal.       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 She will call and let us know when they schedule her. Oxycodone 10 mg/325 one every 4 hours when necessary severe pain cautioned drowsiness

## 2013-03-24 NOTE — Patient Instructions (Addendum)
Call us with neurosug appointment time

## 2013-03-26 ENCOUNTER — Telehealth: Payer: Self-pay | Admitting: Family Medicine

## 2013-03-26 ENCOUNTER — Other Ambulatory Visit: Payer: Self-pay | Admitting: Family Medicine

## 2013-03-26 ENCOUNTER — Other Ambulatory Visit: Payer: Self-pay

## 2013-03-26 DIAGNOSIS — M25512 Pain in left shoulder: Secondary | ICD-10-CM

## 2013-03-26 DIAGNOSIS — M25511 Pain in right shoulder: Secondary | ICD-10-CM

## 2013-03-26 MED ORDER — AZITHROMYCIN 250 MG PO TABS: ORAL_TABLET | ORAL | Status: AC

## 2013-03-26 NOTE — Telephone Encounter (Signed)
Patient says that she went and saw the specialist for her herniated disc and he said he did not think the herniated disc was what was causing her upper back and shoulder pain.  He suggested for her to get an MRI on her shoulder because he thinks its the rotary cuff.  He prescribed her Voltaren 75 mg. Please advise.

## 2013-03-26 NOTE — Telephone Encounter (Signed)
Zpak sent in to pharmacy. Patient notified.

## 2013-03-26 NOTE — Telephone Encounter (Signed)
Patient would like something called in for her sinus and congestion. She said she has a lot of face pressure and a bad headache. Also is coughing up green fling. Please call something in to [facility] [address]

## 2013-03-26 NOTE — Telephone Encounter (Signed)
Please order MRI of shoulder. Tell patient does take quite a few days to get this set up. I would like for her to followup with me 3-5 days after the MRI.

## 2013-03-26 NOTE — Progress Notes (Signed)
MRI left shoulder without contrast scheduled for March 31, 2013 at 8:00 am.

## 2013-03-26 NOTE — Telephone Encounter (Signed)
May call in Z-Pak if persistent symptoms followup

## 2013-03-26 NOTE — Telephone Encounter (Signed)
MRI left shoulder scheduled for March 31, 2013 at 8:00 am. Patient notified.

## 2013-03-29 ENCOUNTER — Telehealth: Payer: Self-pay | Admitting: Family Medicine

## 2013-03-29 NOTE — Telephone Encounter (Signed)
[facility] Auth# [id number] expires 04/16/2013 for MRI C-Spine without contrast at [facility].

## 2013-03-30 ENCOUNTER — Telehealth: Payer: Self-pay | Admitting: Family Medicine

## 2013-03-30 NOTE — Telephone Encounter (Signed)
Had to move pt's MRI L Shoulder @ [facility] from Wednesday 03/31/13 to Friday 04/02/13 @ 7:45 due to precert still pending after giving additional clinical to Patti W at [facility].  Tried to call patient at 3 different numbers, only able to leave a message at her mother's number, requested that someone call me with a good contact number for patient.  Will continue to try calling, mailed a letter with new appt info and reason for change.

## 2013-03-30 NOTE — Telephone Encounter (Signed)
Ortho referal fine with me, may use ortho of patients choice and

## 2013-03-30 NOTE — Telephone Encounter (Signed)
Patient's MRI left shoulder was denied by [facility], had to cancel appointment for 04/02/13 @ [facility], have tried multiple times to call patient, recording states that "the [employer] customer you have called is unavailable because the phone is off or the customer is not in the service area", the number for the patient's significant other has a recording of unavailable.  No voice mail available to leave a message.  Did leave a message on pt's mother's number for someone to call me with a good contact number for the patient.

## 2013-03-30 NOTE — Telephone Encounter (Signed)
Left shoulder MRI was denied by [facility] due to no documentation of 6 consecutive weeks of conservative therapy (home exercises, NSAIDS, oral steroids or injection, PT)  Cancelled appointment for MRI, will go ahead with ortho referral if OK, please advise

## 2013-03-31 ENCOUNTER — Ambulatory Visit (HOSPITAL_COMMUNITY): Payer: Medicaid Other

## 2013-03-31 ENCOUNTER — Other Ambulatory Visit: Payer: Self-pay

## 2013-03-31 ENCOUNTER — Telehealth: Payer: Self-pay | Admitting: Family Medicine

## 2013-03-31 MED ORDER — CEFPROZIL 500 MG PO TABS: 500.0000 mg | ORAL_TABLET | Freq: Two times a day (BID) | ORAL | Status: DC

## 2013-03-31 NOTE — Telephone Encounter (Signed)
cefzil 500    1 bid 10days fu if worse

## 2013-03-31 NOTE — Telephone Encounter (Signed)
Sent in cefzil 500 1 bid for 10 days to [facility]. Patient wants to know what do you recommend at this point since the MRI on her shoulder was denied. She wants to have something done soon so she can stop taking her pain medication.

## 2013-03-31 NOTE — Telephone Encounter (Signed)
See message below. MRI not approved. Tried to contact pt. Pt also wants antibiotic

## 2013-03-31 NOTE — Telephone Encounter (Signed)
Pt states she missed her MRI this morning and she will need to reschedule.  She also states she is not feeling well and took her last pill for her zpak last night.  Does she need another antibiotic called in or give it a few more days.  She is still coughing, congestion, sinus headache and facial pain. [facility] [address] [place] [phone]

## 2013-04-02 ENCOUNTER — Other Ambulatory Visit (HOSPITAL_COMMUNITY): Payer: Medicaid Other

## 2013-04-05 ENCOUNTER — Ambulatory Visit: Payer: Medicaid Other | Admitting: Family Medicine

## 2013-04-07 ENCOUNTER — Telehealth: Payer: Self-pay | Admitting: Family Medicine

## 2013-04-07 NOTE — Telephone Encounter (Signed)
Left message return call on voicemail 

## 2013-04-07 NOTE — Telephone Encounter (Signed)
Dr. Lorin Picket will need to decide

## 2013-04-07 NOTE — Telephone Encounter (Signed)
Patient called and states she needs more pain medication due to extreme pain in her shoulder and neck.  States she had pain medication on March 30, 2013 but she has needed to take the medication around the clock due to the extreme pain.  States she did get the letter for a new referral and has an appointment on Apr 15, 2013.  Also, Patient would like to have a prescription for Ambien due to Nortriptyline HCL 50mg  does not help her at all sleep at night.  PLEASE call patient regarding these medications.

## 2013-04-07 NOTE — Telephone Encounter (Signed)
Patient got Percocet 10/325 #45 on April 15th

## 2013-04-08 ENCOUNTER — Telehealth: Payer: Self-pay | Admitting: *Deleted

## 2013-04-08 NOTE — Telephone Encounter (Signed)
Rx for Percocet 10/325mg  #45 1 qid prn given to patient. Patient scheduled office visit for next week. Patient advised that she can NOT take Ambien with Percocet because together it could lead to premature death.

## 2013-04-16 ENCOUNTER — Ambulatory Visit: Payer: Medicaid Other | Admitting: Family Medicine

## 2013-04-19 ENCOUNTER — Encounter: Payer: Self-pay | Admitting: Family Medicine

## 2013-04-19 ENCOUNTER — Ambulatory Visit (INDEPENDENT_AMBULATORY_CARE_PROVIDER_SITE_OTHER): Payer: Medicaid Other | Admitting: Family Medicine

## 2013-04-19 VITALS — BP 127/76 | Temp 98.8°F | Wt 128.6 lb

## 2013-04-19 DIAGNOSIS — G542 Cervical root disorders, not elsewhere classified: Secondary | ICD-10-CM

## 2013-04-19 DIAGNOSIS — M5412 Radiculopathy, cervical region: Secondary | ICD-10-CM

## 2013-04-19 MED ORDER — GABAPENTIN 100 MG PO CAPS: 100.0000 mg | ORAL_CAPSULE | Freq: Three times a day (TID) | ORAL | Status: DC

## 2013-04-19 NOTE — Progress Notes (Signed)
  Subjective:    Patient ID: Heather Moon, female    DOB: 06/25/1981, 32 y.o.   MRN: [id number]  HPI Patient relates that she is having severe pain at first she was staying stating that it was in her left shoulder now she relates that it's on the left side of the neck into the shoulder down the arm into the hand it has been this way for weeks. She seen a neurosurgeon who basically told her he didn't think it was her neck and told her to see an orthopedist the orthopedist said he really doubted that it was her shoulder but he gave her an injection patient states it made the pain worse that night now the pain is no different than what it was. There is no loss of arm use but she relates pain and discomfort that oxycodone alone is not touching.  This patient typically does not have many reasons to come to be seen. Occasional migraines has had problems in the past with chronic insomnia does not smoke or drink family history noncontributory   Review of Systems Denies blurred vision. No chest tightness shortness of breath. No known injury. Please see symptoms listed above    Objective:   Physical Exam Vital signs stable EOMI neck is supple lungs are clear no crackle heart is regular pulses are normal reflexes are good subjective discomfort in the left shoulder but no restriction in range of motion. Strength equal.       Assessment & Plan:  Severe neuralgia down the left arm-with her abnormal MRI I believe that this is do to her neck and not to her shoulder I recommended Neurontin 100 mg titrate that up retired his daily I also recommended Naprosyn 500 mg twice a day ongoing oxycodone 10 mg/325 mg may be used up to 4 times per day she was given a prescription for 56 tablets and no refills. We will go forward with trying to set her up with a specialist for a second opinion regarding neurosurgery. Patient seems to understand everything that we've stated. 25 minutes spent with the patient see patient  back in 2 weeks.

## 2013-04-19 NOTE — Patient Instructions (Addendum)
neurontin 1 each pm for 4 days then 1 2x a day for 4 days then 1 3x a day   See back in 2 weeks  Naprosyn 2x a day  Pain meds up to 4 times a day  We will set up second opinion

## 2013-05-04 ENCOUNTER — Encounter: Payer: Self-pay | Admitting: Family Medicine

## 2013-05-04 ENCOUNTER — Ambulatory Visit (INDEPENDENT_AMBULATORY_CARE_PROVIDER_SITE_OTHER): Payer: Medicaid Other | Admitting: Family Medicine

## 2013-05-04 VITALS — BP 108/70 | HR 70 | Wt 128.8 lb

## 2013-05-04 DIAGNOSIS — M5020 Other cervical disc displacement, unspecified cervical region: Secondary | ICD-10-CM

## 2013-05-04 MED ORDER — GABAPENTIN 100 MG PO CAPS: ORAL_CAPSULE | ORAL | Status: DC

## 2013-05-04 MED ORDER — OXYCODONE-ACETAMINOPHEN 10-325 MG PO TABS: 1.0000 | ORAL_TABLET | Freq: Four times a day (QID) | ORAL | Status: DC | PRN

## 2013-05-04 NOTE — Patient Instructions (Addendum)
Gradually increase neurontin 100 mg, goal is taking 2 capsules three times a day  neurontin  Start 1 in am , 1 mid day then 2 near bedtime- do this for T,W,Th  Then 2 in am 1 mid day then 2 in evening   Do this F.Sat,Sun  Then 2 , three times a day  Follow up in 4 weeks

## 2013-05-04 NOTE — Progress Notes (Signed)
  Subjective:    Patient ID: Heather Moon, female    DOB: June 15, 1981, 32 y.o.   MRN: [id number]  Headache  This is a chronic problem. The current episode started more than 1 year ago. The problem occurs daily. The problem has been gradually worsening. The pain radiates to the left shoulder. The quality of the pain is described as stabbing. Associated symptoms include blurred vision, dizziness, eye pain, nausea, neck pain, photophobia, scalp tenderness and vomiting. The symptoms are aggravated by bright light, noise, emotional stress, caffeine withdrawal and hunger. Her past medical history is significant for migraine headaches.  Patient states she's doing very well since starting Neurontin is helped her shoulder pain and radicular pain she still has pressure in the back of her head that goes from her neck up into her head she is scheduled to see a neurosurgeon in the middle part of the coming month. Family history noncontributory Social history-Patient doesn't smoke    Review of Systems  HENT: Positive for neck pain.   Eyes: Positive for blurred vision, photophobia and pain.  Gastrointestinal: Positive for nausea and vomiting.  Neurological: Positive for dizziness and headaches.       Objective:   Physical Exam Eardrums normal throat is normal neck no masses subjective discomfort in the back and ahead and the back of her neck shoulder is normal lungs clear heart regular       Assessment & Plan:  Cervical pain with her headache along with herniated disc-prescription for oxycodone 10 mg/325 mg. #120. One 4 times a day when necessary was given. To followup in approximately 4 weeks. Neurontin increase it 2 capsules 3 times daily. In addition to this he neurosurgeon as directed hopefully be able to taper off of the Percocet as times moves forward

## 2013-05-31 ENCOUNTER — Other Ambulatory Visit: Payer: Self-pay | Admitting: Neurosurgery

## 2013-05-31 DIAGNOSIS — M47812 Spondylosis without myelopathy or radiculopathy, cervical region: Secondary | ICD-10-CM

## 2013-06-01 ENCOUNTER — Encounter: Payer: Self-pay | Admitting: Family Medicine

## 2013-06-01 ENCOUNTER — Ambulatory Visit (INDEPENDENT_AMBULATORY_CARE_PROVIDER_SITE_OTHER): Payer: Medicaid Other | Admitting: Family Medicine

## 2013-06-01 VITALS — BP 98/60 | HR 70 | Wt 128.2 lb

## 2013-06-01 DIAGNOSIS — M5020 Other cervical disc displacement, unspecified cervical region: Secondary | ICD-10-CM

## 2013-06-01 MED ORDER — OXYCODONE-ACETAMINOPHEN 10-325 MG PO TABS: 1.0000 | ORAL_TABLET | Freq: Four times a day (QID) | ORAL | Status: DC | PRN

## 2013-06-01 MED ORDER — NAPROXEN 500 MG PO TABS: 500.0000 mg | ORAL_TABLET | Freq: Two times a day (BID) | ORAL | Status: DC

## 2013-06-01 MED ORDER — AMOXICILLIN 500 MG PO TABS: ORAL_TABLET | ORAL | Status: AC

## 2013-06-01 MED ORDER — GABAPENTIN 300 MG PO CAPS: 300.0000 mg | ORAL_CAPSULE | Freq: Three times a day (TID) | ORAL | Status: DC

## 2013-06-01 NOTE — Patient Instructions (Signed)
As part of your visit today we have covered chronic pain. You have been given prescription for pain medicines. Certain pain medications such as hydrocodone  allow for refills. Other pain medications such as oxycodone require separate prescriptions. Nonetheless, your expected to come in for a office visit before further pain medications are issued.   We will not refill medications or early nor will we give an extended month supply at the end of these prescriptions.It is your responsibility to keep up with medications. They will not be replaced.  It is your responsibility to schedule an office visit in 3-4 months to be seen before you are out of your medication. Do not call our office to request early refills or additional refills.   We are required by law to adhere to regulations. Failure on our part to follow these regulations could jeopardize our prescription license which in turn would cause us not to be able to care for you.  

## 2013-06-01 NOTE — Progress Notes (Signed)
  Subjective:    Patient ID: Heather Moon, female    DOB: Mar 18, 1981, 32 y.o.   MRN: [id number]  HPI patient here for a follow up on her neck pain. Pt saw specialist yesterday. Patient with severe pain in the neck. And relates pain discomfort hurts with certain movements. She has been told she is going to need steroid injections. Possibly will need surgery down the road. Denies any other particular troubles. He uses pain medications for 5 times a day. Uses Neurontin 200 mg 3 times daily. Has a cracked tooth that's infected she will need to see a dentist. Past medical history cervical disc left shoulder chronic pain Family history noncontributory Social-does not smoke   Review of Systems She relates pain discomfort left side neck some intermittent numbness left side of face down the left arm. No other particular troubles.    Objective:   Physical Exam  Neck no masses moderate tenderness left side lungs clear hearts regular pulse normal infected right upper molar.      Assessment & Plan:  Infected tooth-amoxicillin 500 mg 3 times a day 10 days, Naprosyn 500 mg twice a day when necessary discomfort, she is to followup with dentist. Neck pain-followup with specialist for injections she will see the neurosurgeon in about 6 weeks she'll followup with [place] in 2 months increase Neurontin 300 mg 3 times a day ongoing notify us if any problems Chronic neck pain oxycodone 10 mg/325 mg 1 4 times a day when necessary #120. 2 prescriptions given. Followup within 2 months.

## 2013-06-11 ENCOUNTER — Inpatient Hospital Stay: Admission: RE | Admit: 2013-06-11 | Payer: Self-pay | Source: Ambulatory Visit

## 2013-06-17 ENCOUNTER — Inpatient Hospital Stay: Admission: RE | Admit: 2013-06-17 | Payer: Self-pay | Source: Ambulatory Visit

## 2013-06-22 ENCOUNTER — Ambulatory Visit
Admission: RE | Admit: 2013-06-22 | Discharge: 2013-06-22 | Disposition: A | Discharge status: Home / Self Care | Payer: Medicaid Other | Source: Ambulatory Visit | Attending: Neurosurgery | Admitting: Neurosurgery

## 2013-06-22 DIAGNOSIS — M47812 Spondylosis without myelopathy or radiculopathy, cervical region: Secondary | ICD-10-CM

## 2013-06-22 MED ORDER — TRIAMCINOLONE ACETONIDE 40 MG/ML IJ SUSP (RADIOLOGY)
60.0000 mg | Freq: Once | INTRAMUSCULAR | Status: AC
  Administered 2013-06-22: 60 mg via EPIDURAL

## 2013-06-22 MED ORDER — IOHEXOL 300 MG/ML  SOLN
1.0000 mL | Freq: Once | INTRAMUSCULAR | Status: AC | PRN
  Administered 2013-06-22: 1 mL via EPIDURAL

## 2013-08-02 ENCOUNTER — Encounter: Payer: Self-pay | Admitting: Family Medicine

## 2013-08-02 ENCOUNTER — Ambulatory Visit (INDEPENDENT_AMBULATORY_CARE_PROVIDER_SITE_OTHER): Payer: Medicaid Other | Admitting: Family Medicine

## 2013-08-02 VITALS — BP 118/64 | Ht 64.0 in | Wt 127.0 lb

## 2013-08-02 DIAGNOSIS — M5020 Other cervical disc displacement, unspecified cervical region: Secondary | ICD-10-CM

## 2013-08-02 DIAGNOSIS — R51 Headache: Secondary | ICD-10-CM

## 2013-08-02 MED ORDER — OXYCODONE-ACETAMINOPHEN 10-325 MG PO TABS: 1.0000 | ORAL_TABLET | Freq: Four times a day (QID) | ORAL | Status: DC | PRN

## 2013-08-02 NOTE — Progress Notes (Signed)
  Subjective:    Patient ID: Heather Moon, female    DOB: 12-01-1981, 32 y.o.   MRN: [id number]  HPIHere for a follow up on neck pain. She had injection in her neck on July 3rd at [facility] imaging. Patient stated it helped with neck pain for a while. She needs referral to go get 2nd injection.   She is still having headaches and blurred vision. She has noticied spots on both eyes. Patient relates she has fuzzy vision is been over years and she is seeing the eye doctor she also relates she would like referral back to [facility] imaging to do a second injection  She also relates frequent headaches she describes in the back of her head she sometimes takes a Percocet to help this out. She denies any radiation of pain down the arm just more pain in the upper left neck into the trapezius no vomiting no diarrhea.  PMH benign Review of Systems See above    Objective:   Physical Exam Lungs are clear hearts regular pulse normal skin warm dry Subjective discomfort left upper portion of the neck       Assessment & Plan:  Fransisca Kaufmann will help set up for a second injection  Frequent headaches referral to neurology I tried to convey to the patient that oxycodone should not be used for headaches only for her neck pain she states she takes no more than 3 per day for her neck pain but sometimes for headaches as well. I warned her about addiction. Also told her we will refill her medicine but once she gets her second injection for her neck we will be tapering down on Percocet.  She is to followup in 3 months

## 2013-08-02 NOTE — Patient Instructions (Signed)
As part of your visit today we addressed your acute pain. Acute pain usually is short lived while the issue is healing. In most cases this is a few days to no more than a few weeks. Your provider today and discuss this with you.  Remember pain is part of the healing process. The purpose of any pain medication is to minimize the pain to a more functional level. It is not meant to totally remove pain.  Remember pain medication can in many instances cause drowsiness which can affect operating heavy machinery/dangerous equipment/driving a car. If the medication causes drowsiness he should not do these functions.  Treatment for acute pain sometimes includes controlled pain medications such as hydrocodone or tramadol. These are meant to be used as short-term medications. It is in your best interest not to take these long term. Many individuals who take these  long-term for pain often become dependent or addicted to these medicines. This could be a major problem if that occurred.  In order to minimize the risk of dependency we often prescribe a small amount of medications. We may or may not refill these medications. Any individual who feels they need these medications ongoing should come back for re- evaluation of their health problem. We follow these strict guidelines for your benefit.

## 2013-08-11 ENCOUNTER — Telehealth: Payer: Self-pay | Admitting: *Deleted

## 2013-08-18 ENCOUNTER — Other Ambulatory Visit: Payer: Self-pay | Admitting: Family Medicine

## 2013-08-18 DIAGNOSIS — M542 Cervicalgia: Secondary | ICD-10-CM

## 2013-08-20 NOTE — Telephone Encounter (Signed)
Pt taken care of.

## 2013-08-30 ENCOUNTER — Inpatient Hospital Stay: Admission: RE | Admit: 2013-08-30 | Payer: Medicaid Other | Source: Ambulatory Visit

## 2013-09-02 ENCOUNTER — Telehealth: Payer: Self-pay | Admitting: Family Medicine

## 2013-09-02 DIAGNOSIS — M5020 Other cervical disc displacement, unspecified cervical region: Secondary | ICD-10-CM

## 2013-09-02 MED ORDER — OXYCODONE-ACETAMINOPHEN 10-325 MG PO TABS: 1.0000 | ORAL_TABLET | Freq: Four times a day (QID) | ORAL | Status: DC | PRN

## 2013-09-02 NOTE — Telephone Encounter (Signed)
Needs refill written on pain meds so she can pick up. Needs Oxycodone 10/325,  If you lower strength, could she take more and more often.  Has appt with neruo 09/06/13. Having trouble with pain. Please call when refill and let her know what changes she needs to cover her pain. Doesn't want to have injection again.  Too painful. bb

## 2013-09-02 NOTE — Telephone Encounter (Signed)
Patient stated her neck is causing severe headaches and she cant take the pain. Pas an appointment with neurosurgeon on Monday Sept 22- advised patient to discuss this with the neurologist and develop a plan with him to help with the headaches. Percocet 10/325mg  #30 one tab QID per Dr. Lorin Picket. Rx printed and left up front for patient pick up. Patient notified.

## 2013-09-02 NOTE — Telephone Encounter (Signed)
Left message to return call 

## 2013-09-02 NOTE — Telephone Encounter (Signed)
Patient cancelled or no showed her last 2 scheduled back injections and states she is not going to get anymore of them because the hurt.  Patient states she needs the Percocet 10/325 mg and cant taper down or decrease dose at this time.   Patient got Percocet 10/325 #120 on 08/02/13

## 2013-09-02 NOTE — Telephone Encounter (Signed)
4 times daily home pain medication is as much as I recommend. There are other medicines that could be tried with her Percocet if she would like to followup for office visit to discuss these options. As for refill on her medication we can do that 4 times daily as the max. If this is not getting the job done then the next step would be consideration to seeing a pain management specialist.

## 2013-09-06 ENCOUNTER — Encounter: Payer: Self-pay | Admitting: Neurology

## 2013-09-06 ENCOUNTER — Ambulatory Visit (INDEPENDENT_AMBULATORY_CARE_PROVIDER_SITE_OTHER): Payer: Medicaid Other | Admitting: Neurology

## 2013-09-06 VITALS — BP 85/62 | HR 56 | Ht 64.0 in | Wt 125.0 lb

## 2013-09-06 DIAGNOSIS — M5020 Other cervical disc displacement, unspecified cervical region: Secondary | ICD-10-CM

## 2013-09-06 DIAGNOSIS — G43009 Migraine without aura, not intractable, without status migrainosus: Secondary | ICD-10-CM

## 2013-09-06 MED ORDER — TIZANIDINE HCL 4 MG PO TABS: 4.0000 mg | ORAL_TABLET | Freq: Four times a day (QID) | ORAL | Status: DC | PRN

## 2013-09-06 MED ORDER — ZONISAMIDE 100 MG PO CAPS: 100.0000 mg | ORAL_CAPSULE | Freq: Two times a day (BID) | ORAL | Status: DC

## 2013-09-06 MED ORDER — CELECOXIB 100 MG PO CAPS: 100.0000 mg | ORAL_CAPSULE | Freq: Two times a day (BID) | ORAL | Status: DC

## 2013-09-06 MED ORDER — ZOLMITRIPTAN 5 MG PO TABS: 5.0000 mg | ORAL_TABLET | ORAL | Status: DC | PRN

## 2013-09-06 NOTE — Progress Notes (Signed)
[facility]  PATIENT: Heather Moon DOB: 1981/06/24  HISTORICAL  Heather Moon is a 32 yo right-handed Caucasian female, referred by her primary care physician Dr. Lorin Moon looking for evaluation of chronic migraine, neck pain, Dr. Lilyan Moon.  She had a history of migraine headaches since high school, her typical migraine starting from occipital region, spreading forward, to the bilateral temporal, frontal retro-orbital area, severe pounding headache with associated light noise sensitivity.  She also has a history of neck pain, upper cervical region, pressure pain, radiating to bilateral shoulders  She began to have frequent headaches over past 1 year, couple times a week, triggered by stress, sleep deprivation,  She also has chronic insomnia, used to take Ambien over the years, she has tried preventive medication Topamax, which helped her some,  She is currently taking Neurontin 300 mg 3 times a day.  MRI of cervical, showed degenerative disc disease, she denies gait difficulty     REVIEW OF SYSTEMS: Full 14 system review of systems performed and notable only for headaches,   ALLERGIES: Allergies  Allergen Reactions  . Doxycycline Hives  . Hydrocodone Nausea And Vomiting  . Macrolides And Ketolides Nausea Only  . Trazodone And Nefazodone Other (See Comments)    Headaches    HOME MEDICATIONS: Outpatient Prescriptions Prior to Visit  Medication Sig Dispense Refill  . cyclobenzaprine (FLEXERIL) 10 MG tablet Take 10 mg by mouth 3 (three) times daily as needed for muscle spasms.      Marland Kitchen gabapentin (NEURONTIN) 300 MG capsule Take 1 capsule (300 mg total) by mouth 3 (three) times daily.  90 capsule  2  . naproxen (NAPROSYN) 500 MG tablet Take 1 tablet (500 mg total) by mouth 2 (two) times daily with a meal.  60 tablet  2  . oxyCODONE-acetaminophen (PERCOCET) 10-325 MG per tablet Take 1 tablet by mouth every 6 (six) hours as needed for pain.  30 tablet  0      PAST MEDICAL HISTORY: Past Medical History  Diagnosis Date  . Migraine   . Blood transfusion    . GERD (gastroesophageal reflux disease)     takes Prilosec as needed  . Cholelithiasis   . Constipation, chronic   . Renal disorder   . Migraine headache   . Chronic insomnia     PAST SURGICAL HISTORY: Past Surgical History  Procedure Laterality Date  . 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    . Abdominal hysterectomy  08/27/2010  . Cholecystectomy  01/11/2013    Procedure: LAPAROSCOPIC CHOLECYSTECTOMY;  Surgeon: Shelly Rubenstein, MD;  Location: [facility];  Service: General;  Laterality: N/A;  Laparoscopic cholecystectomy  . Eye surgery, strabismus Left     FAMILY HISTORY: Family History  Problem Relation Age of Onset  . Cancer Maternal Grandmother   . Cirrhosis Maternal Grandfather     SOCIAL HISTORY:  Social History  . Marital Status: Divorced    Spouse Name: N/A    Number of Children: 5  . Years of Education: 10 th   Occupational History    Homemaker   Social History Main Topics  . Smoking status: Never Smoker   . Smokeless tobacco: Never Used  . Alcohol Use: No  . Drug Use: No  . Sexual Activity: Yes   Social History Narrative   Patient lives at home with her boyfriend. Patient is a homemaker. Patient has 10 th grade education.   Caffeine- sweet tea three cups daily.  Left handed.   PHYSICAL EXAM   Filed Vitals:   09/06/13 1030  BP: 85/62  Pulse: 56  Height: 5\' 4"  (1.626 m)  Weight: 125 lb (56.7 kg)    Body mass index is 21.45 kg/(m^2).   Generalized: In no acute distress  Neck: Supple, no carotid bruits   Cardiac: Regular rate rhythm  Pulmonary: Clear to auscultation bilaterally  Musculoskeletal: No deformity  Neurological examination  Mentation: Alert oriented to time, place, history taking, and causual conversation  Cranial nerve II-XII: Pupils were equal round reactive to light extraocular  movements were full, visual field were full on confrontational test. facial sensation and strength were normal. hearing was intact to finger rubbing bilaterally. Uvula tongue midline.  head turning and shoulder shrug and were normal and symmetric.Tongue protrusion into cheek strength was normal.  Motor: normal tone, bulk and strength.  Sensory: Intact to fine touch, pinprick, preserved vibratory sensation, and proprioception at toes.  Coordination: Normal finger to nose, heel-to-shin bilaterally there was no truncal ataxia  Gait: Rising up from seated position without assistance, normal stance, without trunk ataxia, moderate stride, good arm swing, smooth turning, able to perform tiptoe, and heel walking without difficulty.   Romberg signs: Negative  Deep tendon reflexes: Brachioradialis 2/2, biceps 2/2, triceps 2/2, patellar 2/2, Achilles 2/2, plantar responses were flexor bilaterally.   DIAGNOSTIC DATA (LABS, IMAGING, TESTING) - I reviewed patient records, labs, notes, testing and imaging myself where available.  Lab Results  Component Value Date   WBC 7.9 02/10/2013   HGB 13.4 02/10/2013   HCT 39.0 02/10/2013   MCV 93.8 02/10/2013   PLT 241 02/10/2013      Component Value Date/Time   NA 140 02/10/2013 1920   K 4.1 02/10/2013 1920   CL 105 02/10/2013 1920   CO2 29 02/10/2013 1920   GLUCOSE 83 02/10/2013 1920   BUN 9 02/10/2013 1920   CREATININE 1.22* 02/10/2013 1920   CALCIUM 9.1 02/10/2013 1920   PROT 6.7 02/10/2013 1920   ALBUMIN 3.6 02/10/2013 1920   AST 12 02/10/2013 1920   ALT 6 02/10/2013 1920   ALKPHOS 59 02/10/2013 1920   BILITOT 0.3 02/10/2013 1920   GFRNONAA 58* 02/10/2013 1920   GFRAA 67* 02/10/2013 1920   ASSESSMENT AND PLAN   32 years old right-handed Caucasian female, with past medical history of chronic neck pain, migraine headaches, presenting with increased  frequency of  headaches,  She has normal neurological examination.  1. We will try preventive medications  Zonegran 100 mg twice a day, Zomig as needed for abortive treatment, tizanidine as needed  2. Neck stretching exercise, hot compression, celebrex for neck pain. 3. RTC in 3 months with Heather Moon, M.D. Ph.D.  [facility] [address], [address] [place], [place] [address] [phone]

## 2013-09-27 ENCOUNTER — Telehealth: Payer: Self-pay | Admitting: Neurology

## 2013-09-27 ENCOUNTER — Ambulatory Visit
Admission: RE | Admit: 2013-09-27 | Discharge: 2013-09-27 | Disposition: A | Discharge status: Home / Self Care | Payer: Medicaid Other | Source: Ambulatory Visit | Attending: Neurology | Admitting: Neurology

## 2013-09-27 DIAGNOSIS — R51 Headache: Secondary | ICD-10-CM

## 2013-09-27 DIAGNOSIS — G43009 Migraine without aura, not intractable, without status migrainosus: Secondary | ICD-10-CM

## 2013-09-27 DIAGNOSIS — M5020 Other cervical disc displacement, unspecified cervical region: Secondary | ICD-10-CM

## 2013-09-27 MED ORDER — MELOXICAM 7.5 MG PO TABS: 7.5000 mg | ORAL_TABLET | Freq: Two times a day (BID) | ORAL | Status: DC

## 2013-09-27 NOTE — Telephone Encounter (Signed)
I have called Heather Moon, she is having frequent migraines, zomig is not helping any, she felt like swelling in her head , she also complains of pain in her upper nuchal area, occipital region.  I have called in Mobic 7.5mg  bid prn.

## 2013-09-28 ENCOUNTER — Telehealth: Payer: Self-pay | Admitting: Neurology

## 2013-09-28 NOTE — Telephone Encounter (Signed)
I have called her, left message, she complains of neck pain, I have prescribed mobic 7.5mg  bid as needed.

## 2013-09-30 ENCOUNTER — Telehealth: Payer: Self-pay | Admitting: *Deleted

## 2013-09-30 ENCOUNTER — Telehealth: Payer: Self-pay | Admitting: Family Medicine

## 2013-09-30 NOTE — Telephone Encounter (Signed)
I have just discussed treatment plan with her today, please call her again, I will not write percocet for her headaches.

## 2013-09-30 NOTE — Telephone Encounter (Signed)
I called pt and relayed the MRI brain results.   She states that the medications that she is taking is not helping.  (making her sick to her stomach).  She believes that the problem is her neck.  She is taking the cerebrex, zanaflex, zomig prn, mobic, zonegran.  Please advise.

## 2013-09-30 NOTE — Telephone Encounter (Signed)
Pt is no longer on the oxycodone per her Neurologist request. She asked them for ambien to help sleep but the Neurologist stated she needed to request it from you. Can we fill some for her? [facility] [place]

## 2013-09-30 NOTE — Progress Notes (Signed)
Quick Note:  I gave her the results of MRI. She verbalized understanding. Still with headache/ and thinks it is neck related. ______

## 2013-09-30 NOTE — Telephone Encounter (Signed)
I have talked with Heather Moon, MRI cervical showed degenerative changes, she complains of headache starting from left neck, spreading forward.  She was referred by her primary care physician for Physical Therapy/Chiropractor,  not was not approved.  Will try BOTOX preauthorization

## 2013-09-30 NOTE — Telephone Encounter (Addendum)
Spoke with patient and she said that she wants Dr Terrace Arabia to temporarily prescribe percocet until she has botox treatment.Her pcp will not continue to prescribe until the problem is fixed that's why he referred  her to a neurology clinic.  The meloxicam and the zonisamide is not working,irritating her stomach. Her headaches make her feel as though her head is swelling

## 2013-09-30 NOTE — Telephone Encounter (Signed)
Dr. Terrace Arabia spoke w/ patient. Closing encounter.

## 2013-09-30 NOTE — Telephone Encounter (Signed)
Patient is calling requesting Oxycodone for her pain. She states Dr. Terrace Arabia gave her pain medication that does not work. Please call

## 2013-09-30 NOTE — Telephone Encounter (Signed)
Ambien 5 mg, #30, 1 each bedtime when necessary, 3 refills

## 2013-10-01 MED ORDER — ZOLPIDEM TARTRATE 5 MG PO TABS: 5.0000 mg | ORAL_TABLET | Freq: Every evening | ORAL | Status: DC | PRN

## 2013-10-01 MED ORDER — TRAMADOL HCL 50 MG PO TABS: 50.0000 mg | ORAL_TABLET | Freq: Four times a day (QID) | ORAL | Status: DC | PRN

## 2013-10-01 NOTE — Telephone Encounter (Signed)
Spoke to patient and relayed that Tramadol was prescribed, she said that she has tried that and it doesn't work.  She would like a referral to pain management.  Again she went on and on about her neck causing her migraines.  And said how she just wanted pain meds to get her through until her Botox is approved.  I told her again that the doctor will not prescribe the percocet and I will speak to doctor on Monday about pain management.

## 2013-10-01 NOTE — Telephone Encounter (Signed)
Notified patient that RX will be faxed to pharmacy today.

## 2013-10-01 NOTE — Telephone Encounter (Signed)
Spoke to patient and relayed that the doctor will not prescribe Percocet.  The patient went on and on about her pain and what she was going to do until her Botox got approved.  I said I would ask the doctor about a referral to a pain clinic, she was interested in that, but also asked if the doctor could prescibe something else in the meantime to help with the pain.  Nothing she was given is helping her.  Please advise.

## 2013-10-01 NOTE — Telephone Encounter (Signed)
Please call her, I have written Tramadol 50mg  prn for her pain.

## 2013-10-04 NOTE — Telephone Encounter (Signed)
Please call patient, I have referred her to pain management. 

## 2013-10-04 NOTE — Addendum Note (Signed)
Addended by: Levert Feinstein on: 10/04/2013 04:32 PM   Modules accepted: Orders

## 2013-10-05 ENCOUNTER — Encounter (HOSPITAL_COMMUNITY): Payer: Self-pay | Admitting: Emergency Medicine

## 2013-10-05 ENCOUNTER — Emergency Department (HOSPITAL_COMMUNITY)
Admission: EM | Admit: 2013-10-05 | Discharge: 2013-10-05 | Disposition: A | Discharge status: Home / Self Care | Payer: Medicaid Other | Attending: Emergency Medicine | Admitting: Emergency Medicine

## 2013-10-05 DIAGNOSIS — Z79899 Other long term (current) drug therapy: Secondary | ICD-10-CM | POA: Insufficient documentation

## 2013-10-05 DIAGNOSIS — G4700 Insomnia, unspecified: Secondary | ICD-10-CM | POA: Insufficient documentation

## 2013-10-05 DIAGNOSIS — R51 Headache: Secondary | ICD-10-CM | POA: Insufficient documentation

## 2013-10-05 DIAGNOSIS — K089 Disorder of teeth and supporting structures, unspecified: Secondary | ICD-10-CM | POA: Insufficient documentation

## 2013-10-05 DIAGNOSIS — R220 Localized swelling, mass and lump, head: Secondary | ICD-10-CM | POA: Insufficient documentation

## 2013-10-05 DIAGNOSIS — Z87448 Personal history of other diseases of urinary system: Secondary | ICD-10-CM | POA: Insufficient documentation

## 2013-10-05 DIAGNOSIS — G43909 Migraine, unspecified, not intractable, without status migrainosus: Secondary | ICD-10-CM | POA: Insufficient documentation

## 2013-10-05 DIAGNOSIS — K029 Dental caries, unspecified: Secondary | ICD-10-CM

## 2013-10-05 DIAGNOSIS — Z8719 Personal history of other diseases of the digestive system: Secondary | ICD-10-CM | POA: Insufficient documentation

## 2013-10-05 DIAGNOSIS — H9209 Otalgia, unspecified ear: Secondary | ICD-10-CM | POA: Insufficient documentation

## 2013-10-05 MED ORDER — PENICILLIN V POTASSIUM 500 MG PO TABS: 500.0000 mg | ORAL_TABLET | Freq: Four times a day (QID) | ORAL | Status: DC

## 2013-10-05 MED ORDER — OXYCODONE-ACETAMINOPHEN 5-325 MG PO TABS: 1.0000 | ORAL_TABLET | ORAL | Status: DC | PRN

## 2013-10-05 NOTE — ED Provider Notes (Signed)
CSN: [id number]     Arrival date & time 10/05/13  1622 History   This chart was scribed for non-physician practitioner Dierdre Forth, PA-C working with Derwood Kaplan, MD by Clydene Laming, ED Scribe. This patient was seen in room TR05C/TR05C and the patient's care was started at 6:43 PM.     Chief Complaint  Patient presents with  . Dental Pain    The history is provided by the patient. No language interpreter was used.   HPI Comments: Heather Moon is a 32 y.o. female who presents to the Emergency Department complaining of gradual, persistent, gradually worsening right side dental pain onset for 1.5 weeks. She reports pain is located in the right upper molars and right lower jawline. She denies swelling of her face, fever, chills, rash, nausea, vomiting, diarrhea, weakness, dizziness, for swallowing, sore throat. Pt reports using Advil without relief. Warm liquids make the pain better and cold liquids make them worse. Pt reports recently getting back her medicaid and will be able to see the dentist now. She reports she has not seen a dentist in many years.  Past Medical History  Diagnosis Date  . Migraine   . Blood transfusion without reported diagnosis   . GERD (gastroesophageal reflux disease)     takes Prilosec as needed  . Cholelithiasis   . Constipation, chronic   . 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 Migraine headache   . Chronic insomnia    Past Surgical History  Procedure Laterality Date  . Renal artery stent    . Abdominal hysterectomy  08/27/2010  . Cholecystectomy  01/11/2013    Procedure: LAPAROSCOPIC CHOLECYSTECTOMY;  Surgeon: Shelly Rubenstein, MD;  Location: [facility];  Service: General;  Laterality: N/A;  Laparoscopic cholecystectomy  . Eye surgery Left     at 6mos old   Family History  Problem Relation Age of Onset  . Cancer Maternal Grandmother   . Cirrhosis Maternal Grandfather    History  Substance Use Topics  .  Smoking status: Never Smoker   . Smokeless tobacco: Never Used  . Alcohol Use: No   OB History   Grav Para Term Preterm Abortions TAB SAB Ect Mult Living                 Review of Systems  Constitutional: Negative for fever, chills and appetite change.  HENT: Positive for dental problem, ear pain and facial swelling. Negative for drooling, nosebleeds, postnasal drip, rhinorrhea and trouble swallowing.   Eyes: Negative for pain and redness.  Respiratory: Negative for cough and wheezing.   Cardiovascular: Negative for chest pain.  Gastrointestinal: Negative for nausea, vomiting and abdominal pain.  Musculoskeletal: Negative for neck pain and neck stiffness.  Skin: Negative for color change and rash.  Neurological: Positive for headaches. Negative for weakness and light-headedness.  All other systems reviewed and are negative.    Allergies  Doxycycline; Hydrocodone; Macrolides and ketolides; and Trazodone and nefazodone  Home Medications   Current Outpatient Rx  Name  Route  Sig  Dispense  Refill  . celecoxib (CELEBREX) 100 MG capsule   Oral   Take 1 capsule (100 mg total) by mouth 2 (two) times daily.   60 capsule   12   . tiZANidine (ZANAFLEX) 4 MG tablet   Oral   Take 1 tablet (4 mg total) by mouth every 6 (six) hours as needed.   30 tablet   6   .  zolmitriptan (ZOMIG) 5 MG tablet   Oral   Take 1 tablet (5 mg total) by mouth as needed for migraine.   15 tablet   6   . zolpidem (AMBIEN) 5 MG tablet   Oral   Take 1 tablet (5 mg total) by mouth at bedtime as needed for sleep.   30 tablet   3   . zonisamide (ZONEGRAN) 100 MG capsule   Oral   Take 1 capsule (100 mg total) by mouth 2 (two) times daily.   60 capsule   12   . oxyCODONE-acetaminophen (PERCOCET/ROXICET) 5-325 MG per tablet   Oral   Take 1 tablet by mouth every 4 (four) hours as needed for pain.   7 tablet   0   . penicillin v potassium (VEETID) 500 MG tablet   Oral   Take 1 tablet (500 mg  total) by mouth 4 (four) times daily.   40 tablet   0    Triage Vitals:BP 107/64  Pulse 67  Temp(Src) 98.6 F (37 C) (Oral)  Resp 18  SpO2 99% Physical Exam  Nursing note and vitals reviewed. Constitutional: She appears well-developed and well-nourished.  HENT:  Head: Normocephalic.  Right Ear: Tympanic membrane, external ear and ear canal normal.  Left Ear: Tympanic membrane, external ear and ear canal normal.  Nose: Nose normal. Right sinus exhibits no maxillary sinus tenderness and no frontal sinus tenderness. Left sinus exhibits no maxillary sinus tenderness and no frontal sinus tenderness.  Mouth/Throat: Uvula is midline, oropharynx is clear and moist and mucous membranes are normal. Mucous membranes are not dry. No oral lesions. Abnormal dentition. Dental caries present. No uvula swelling or lacerations. No oropharyngeal exudate, posterior oropharyngeal edema, posterior oropharyngeal erythema or tonsillar abscesses.    Eyes: Conjunctivae are normal. Pupils are equal, round, and reactive to light. Right eye exhibits no discharge. Left eye exhibits no discharge.  Neck: Normal range of motion. Neck supple.  Cardiovascular: Normal rate, regular rhythm and normal heart sounds.   Pulmonary/Chest: Effort normal and breath sounds normal. No respiratory distress. She has no wheezes.  Abdominal: Soft. Bowel sounds are normal. She exhibits no distension. There is no tenderness.  Lymphadenopathy:    She has no cervical adenopathy.  Neurological: She is alert.  Skin: Skin is warm and dry.  Psychiatric: She has a normal mood and affect.    ED Course  Dental Date/Time: 10/05/2013 6:45 PM Performed by: Dierdre Forth Authorized by: Dierdre Forth Consent: Verbal consent obtained. Risks and benefits: risks, benefits and alternatives were discussed Consent given by: patient Patient understanding: patient states understanding of the procedure being performed Patient consent:  the patient's understanding of the procedure matches consent given Procedure consent: procedure consent matches procedure scheduled Relevant documents: relevant documents present and verified Site marked: the operative site was marked Required items: required blood products, implants, devices, and special equipment available Patient identity confirmed: verbally with patient and arm band Time out: Immediately prior to procedure a "time out" was called to verify the correct patient, procedure, equipment, support staff and site/side marked as required. Preparation: Patient was prepped and draped in the usual sterile fashion. Local anesthesia used: yes Local anesthetic: bupivacaine 0.5% with epinephrine Anesthetic total: 1 ml Patient sedated: no Patient tolerance: Patient tolerated the procedure well with no immediate complications. Comments: Dental block tooth number 32 with complete pain relief.   (including critical care time) DIAGNOSTIC STUDIES: Oxygen Saturation is 99% on RA, normal by my interpretation.    COORDINATION  OF CARE: 6:53 PM- Discussed treatment plan with pt at bedside. Pt verbalized understanding and agreement with plan.   Labs Review Labs Reviewed - No data to display Imaging Review No results found.  EKG Interpretation   None       MDM   1. Pain due to dental caries      Heather Moon presents with dental pain.  Patient with toothache.  No gross abscess.  Exam unconcerning for Ludwig's angina or spread of infection.  Will treat with penicillin and pain medicine.  Urged patient to follow-up with dentist.    It has been determined that no acute conditions requiring further emergency intervention are present at this time. The patient/guardian have been advised of the diagnosis and plan. We have discussed signs and symptoms that warrant return to the ED, such as changes or worsening in symptoms.   Vital signs are stable at discharge.   BP 103/66  Pulse 57   Temp(Src) 98.6 F (37 C) (Oral)  Resp 16  SpO2 100%  Patient/guardian has voiced understanding and agreed to follow-up with the PCP or specialist.         Dierdre Forth, PA-C 10/05/13 1953

## 2013-10-05 NOTE — ED Notes (Addendum)
Pt reports pain on the top and bottom of the right side of mouth. Reports that she has a bad tooth in both areas. Pt reports the pain is giving her a headache. Pain has been there for a week and a half.

## 2013-10-06 ENCOUNTER — Telehealth: Payer: Self-pay | Admitting: *Deleted

## 2013-10-06 DIAGNOSIS — M62838 Other muscle spasm: Secondary | ICD-10-CM

## 2013-10-06 DIAGNOSIS — M5020 Other cervical disc displacement, unspecified cervical region: Secondary | ICD-10-CM

## 2013-10-06 DIAGNOSIS — M25512 Pain in left shoulder: Secondary | ICD-10-CM

## 2013-10-06 NOTE — ED Provider Notes (Signed)
Medical screening examination/treatment/procedure(s) were performed by non-physician practitioner and as supervising physician I was immediately available for consultation/collaboration.   Swan Zayed, MD 10/06/13 1515 

## 2013-10-06 NOTE — Telephone Encounter (Signed)
Called patient and informed that the order is going to [facility] and will be faxed tomorrow to their office and they will contact her to schedule appointment,patient understood

## 2013-10-12 NOTE — Telephone Encounter (Signed)
Spoke to patient and she relayed she has an appointment with pain clinic in November.  She wanted to know what she was going to do for pain until then.  I again told her the doctor is not prescribing anything else but the Tramadol and pain clinic.  She expressed understanding.

## 2013-10-15 ENCOUNTER — Telehealth: Payer: Self-pay | Admitting: Family Medicine

## 2013-10-15 ENCOUNTER — Other Ambulatory Visit: Payer: Self-pay | Admitting: Family Medicine

## 2013-10-15 NOTE — Telephone Encounter (Signed)
Patient called again to check on message to see when it was going to be done. I told patient that doctor answers messages between patients and was unsure of exact time.

## 2013-10-15 NOTE — Telephone Encounter (Signed)
Patient needs refill for acyclovir    [facility]

## 2013-10-15 NOTE — Telephone Encounter (Signed)
Transferred to front desk to schedule appointment.  

## 2013-10-15 NOTE — Telephone Encounter (Signed)
No - needs OV

## 2013-10-15 NOTE — Telephone Encounter (Signed)
Patient has 2 abcess teeth and does not have an appointment with oral surgeon until November 17th. She would like something for pain if possible. She said tramadol is not helping and she has been told by the ER that she will need to go to PCP to get any more pain medication.

## 2013-10-18 ENCOUNTER — Ambulatory Visit (INDEPENDENT_AMBULATORY_CARE_PROVIDER_SITE_OTHER): Payer: Medicaid Other | Admitting: Family Medicine

## 2013-10-18 ENCOUNTER — Telehealth: Payer: Self-pay | Admitting: Family Medicine

## 2013-10-18 ENCOUNTER — Encounter: Payer: Self-pay | Admitting: Family Medicine

## 2013-10-18 VITALS — BP 122/82 | Ht 64.0 in | Wt 130.4 lb

## 2013-10-18 DIAGNOSIS — M5090 Cervical disc disorder, unspecified, unspecified cervical region: Secondary | ICD-10-CM

## 2013-10-18 MED ORDER — OXYCODONE-ACETAMINOPHEN 10-325 MG PO TABS: 1.0000 | ORAL_TABLET | Freq: Four times a day (QID) | ORAL | Status: DC | PRN

## 2013-10-18 NOTE — Telephone Encounter (Signed)
This came into my inbox as a refill request over the weekend I [facility] morning

## 2013-10-18 NOTE — Telephone Encounter (Signed)
No, this appointment may be removed, followup when necessary

## 2013-10-18 NOTE — Progress Notes (Signed)
  Subjective:    Patient ID: Heather Moon, female    DOB: 1981/07/27, 32 y.o.   MRN: [id number]  HPI Patient arrives to follow up from the ER for an abscess tooth. Due to see the oral surgeon soon to have tooth removed. Patient has 2 teeth they'll need to be removed.  Patient also has chronic neck pain related to spurring and slight herniated disc has seen a neurosurgeon he did not feel she needed surgery saw a neurologist for the headaches who is going to try Botox patient still with severe neck pain and discomfort. She has been told kindly that we will not be a little prescribe pain medications on a ongoing basis for her neck she has an appointment with pain management later this month.  I believe patient when she states that she does not abuse medication  Review of Systems Denies headaches chest pain shortness of breath    Objective:   Physical Exam  Neck subjective discomfort on the right side lungs are clear hearts regular pulse normal blood pressure good      Assessment & Plan:  #1 abscess to see oral surgeon as planned. Pain medication Percocet 10 mg/325 #30 one every 6 hours when necessary was given  #2 chronic neck pain over the past several months see pain management specialist as planned await what they find. I have informed the patient that we will not be prescribing pain medication on a ongoing basis here. I think that would be best through a pain management.

## 2013-10-18 NOTE — Telephone Encounter (Signed)
Made patient aware.

## 2013-10-18 NOTE — Patient Instructions (Signed)
Use percocet sparingly  May use ibuprofen  Keep appointment at Pain clinic on 25th

## 2013-10-18 NOTE — Telephone Encounter (Signed)
Patient wants to know if she needs to keep 11/02/13 appointment since she just came in today?

## 2013-11-02 ENCOUNTER — Ambulatory Visit: Payer: Medicaid Other | Admitting: Family Medicine

## 2013-11-03 ENCOUNTER — Encounter (HOSPITAL_COMMUNITY): Payer: Self-pay | Admitting: Emergency Medicine

## 2013-11-03 ENCOUNTER — Emergency Department (HOSPITAL_COMMUNITY)
Admission: EM | Admit: 2013-11-03 | Discharge: 2013-11-03 | Disposition: A | Discharge status: Home / Self Care | Payer: Medicaid Other | Attending: Emergency Medicine | Admitting: Emergency Medicine

## 2013-11-03 DIAGNOSIS — Z792 Long term (current) use of antibiotics: Secondary | ICD-10-CM | POA: Insufficient documentation

## 2013-11-03 DIAGNOSIS — K029 Dental caries, unspecified: Secondary | ICD-10-CM | POA: Insufficient documentation

## 2013-11-03 DIAGNOSIS — K0381 Cracked tooth: Secondary | ICD-10-CM | POA: Insufficient documentation

## 2013-11-03 DIAGNOSIS — Z791 Long term (current) use of non-steroidal anti-inflammatories (NSAID): Secondary | ICD-10-CM | POA: Insufficient documentation

## 2013-11-03 DIAGNOSIS — R6883 Chills (without fever): Secondary | ICD-10-CM | POA: Insufficient documentation

## 2013-11-03 DIAGNOSIS — K002 Abnormalities of size and form of teeth: Secondary | ICD-10-CM | POA: Insufficient documentation

## 2013-11-03 DIAGNOSIS — G43909 Migraine, unspecified, not intractable, without status migrainosus: Secondary | ICD-10-CM | POA: Insufficient documentation

## 2013-11-03 DIAGNOSIS — Z87448 Personal history of other diseases of urinary system: Secondary | ICD-10-CM | POA: Insufficient documentation

## 2013-11-03 MED ORDER — OXYCODONE-ACETAMINOPHEN 5-325 MG PO TABS
2.0000 | ORAL_TABLET | Freq: Once | ORAL | Status: AC
  Administered 2013-11-03: 2 via ORAL
  Filled 2013-11-03: qty 2

## 2013-11-03 MED ORDER — OXYCODONE-ACETAMINOPHEN 5-325 MG PO TABS: 1.0000 | ORAL_TABLET | Freq: Four times a day (QID) | ORAL | Status: DC | PRN

## 2013-11-03 NOTE — ED Notes (Signed)
Pt. reports persistent right upper molar pain for several weeks unrelieved by OTC pain medication .

## 2013-11-03 NOTE — ED Notes (Signed)
Pt alert and oriented, with steady gait at time of discharge. Pt given discharge papers and papers explained. All questions answered and pt walked to discharge.  

## 2013-11-03 NOTE — ED Provider Notes (Signed)
CSN: [id number]     Arrival date & time 11/03/13  2014 History  This chart was scribed for non-physician practitioner, Junius Finner, PA-C,working with Raeford Razor, MD, by Karle Plumber, ED Scribe.  This patient was seen in room TR05C/TR05C and the patient's care was started at 8:44 PM.  Chief Complaint  Patient presents with  . Dental Pain   The history is provided by the patient. No language interpreter was used.   HPI Comments:  Heather Moon is a 32 y.o. female who presents to the Emergency Department complaining of constant, severe upper right dental pain. Pt reports associated chills. Pt states she has seen her dentist and was referred to a Designer, industrial/product. She states her appt with the surgeon is 11/10/13. Pt denies nausea and vomiting. She states her dentist prescribed PCN-VK and she has been taking it as prescribed. She requests pain medicine to last her until her appt. She states she does not want tramadol because it has no effect on her. She states she is allergic to Hydrocodone.    Past Medical History  Diagnosis Date  . Migraine   . Blood transfusion without reported diagnosis   . GERD (gastroesophageal reflux disease)     takes Prilosec as needed  . Cholelithiasis   . Constipation, chronic   . 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 Migraine headache   . Chronic insomnia    Past Surgical History  Procedure Laterality Date  . Renal artery stent    . Abdominal hysterectomy  08/27/2010  . Cholecystectomy  01/11/2013    Procedure: LAPAROSCOPIC CHOLECYSTECTOMY;  Surgeon: Shelly Rubenstein, MD;  Location: [facility];  Service: General;  Laterality: N/A;  Laparoscopic cholecystectomy  . Eye surgery Left     at 6mos old   Family History  Problem Relation Age of Onset  . Cancer Maternal Grandmother   . Cirrhosis Maternal Grandfather    History  Substance Use Topics  . Smoking status: Never Smoker   . Smokeless tobacco: Never Used  .  Alcohol Use: No   OB History   Grav Para Term Preterm Abortions TAB SAB Ect Mult Living                 Review of Systems  HENT: Positive for dental problem.   All other systems reviewed and are negative.    Allergies  Doxycycline; Hydrocodone; Macrolides and ketolides; and Trazodone and nefazodone  Home Medications   Current Outpatient Rx  Name  Route  Sig  Dispense  Refill  . acyclovir (ZOVIRAX) 400 MG tablet      TAKE 1 TABLET BY MOUTH 4 TIMES A DAY FOR 5 DAYS   20 tablet   0   . celecoxib (CELEBREX) 100 MG capsule   Oral   Take 1 capsule (100 mg total) by mouth 2 (two) times daily.   60 capsule   12   . oxyCODONE-acetaminophen (PERCOCET) 10-325 MG per tablet   Oral   Take 1 tablet by mouth every 6 (six) hours as needed for pain.   30 tablet   0   . oxyCODONE-acetaminophen (PERCOCET/ROXICET) 5-325 MG per tablet   Oral   Take 1-2 tablets by mouth every 6 (six) hours as needed for severe pain.   10 tablet   0   . penicillin v potassium (VEETID) 500 MG tablet   Oral   Take 1 tablet (500 mg total) by mouth  4 (four) times daily.   40 tablet   0   . tiZANidine (ZANAFLEX) 4 MG tablet   Oral   Take 1 tablet (4 mg total) by mouth every 6 (six) hours as needed.   30 tablet   6   . traMADol (ULTRAM) 50 MG tablet   Oral   Take 50 mg by mouth every 6 (six) hours as needed for pain.         Marland Kitchen zolmitriptan (ZOMIG) 5 MG tablet   Oral   Take 1 tablet (5 mg total) by mouth as needed for migraine.   15 tablet   6   . zolpidem (AMBIEN) 5 MG tablet   Oral   Take 1 tablet (5 mg total) by mouth at bedtime as needed for sleep.   30 tablet   3   . zonisamide (ZONEGRAN) 100 MG capsule   Oral   Take 1 capsule (100 mg total) by mouth 2 (two) times daily.   60 capsule   12    Triage Vitals: BP 104/77  Pulse 68  Temp(Src) 99.2 F (37.3 C) (Oral)  Resp 14  Ht 5\' 4"  (1.626 m)  Wt 132 lb (59.875 kg)  BMI 22.65 kg/m2  SpO2 97% Physical Exam  Nursing note  and vitals reviewed. Constitutional: She is oriented to person, place, and time. She appears well-developed and well-nourished.  HENT:  Head: Normocephalic and atraumatic.  Mouth/Throat: Uvula is midline and oropharynx is clear and moist. She does not have dentures. No oral lesions. No trismus in the jaw. Abnormal dentition. Dental caries present. No dental abscesses, uvula swelling or lacerations. No oropharyngeal exudate.    Right upper molar cracked down into gingiva. TTP. No active drainage. No surrounding swelling.  Eyes: EOM are normal.  Neck: Normal range of motion.  Cardiovascular: Normal rate.   Pulmonary/Chest: Effort normal.  Musculoskeletal: Normal range of motion.  Neurological: She is alert and oriented to person, place, and time.  Skin: Skin is warm and dry.  Psychiatric: She has a normal mood and affect. Her behavior is normal.    ED Course  Procedures (including critical care time) DIAGNOSTIC STUDIES: Oxygen Saturation is 97% on RA, normal by my interpretation.   COORDINATION OF CARE: 8:47 PM- Will give pt pain medication. Pt verbalizes understanding and agrees to plan.  Medications  oxyCODONE-acetaminophen (PERCOCET/ROXICET) 5-325 MG per tablet 2 tablet (2 tablets Oral Given 11/03/13 2059)    Labs Review Labs Reviewed - No data to display Imaging Review No results found.  EKG Interpretation   None       MDM   1. Pain due to dental caries   2. Cracked tooth    Pt with dental pain requesting more pain medication. Reports having dental appointment on the 28th with an oral surgeon.  Rx: percocet (10 tabs) advised to f/u as scheduled with dentist as well as call them for better pain relief if needed prior to next appointment.  Pt verbalized understanding and agreement with tx plan.  I personally performed the services described in this documentation, which was scribed in my presence. The recorded information has been reviewed and is accurate.    Junius Finner, PA-C 11/03/13 2206

## 2013-11-04 NOTE — ED Provider Notes (Signed)
Medical screening examination/treatment/procedure(s) were performed by non-physician practitioner and as supervising physician I was immediately available for consultation/collaboration.  EKG Interpretation   None        Shalan Neault, MD 11/04/13 0103 

## 2013-12-06 ENCOUNTER — Ambulatory Visit: Payer: Medicaid Other | Admitting: Nurse Practitioner

## 2013-12-06 ENCOUNTER — Telehealth: Payer: Self-pay | Admitting: Nurse Practitioner

## 2013-12-06 NOTE — Telephone Encounter (Signed)
No show for scheduled appt 

## 2013-12-27 ENCOUNTER — Other Ambulatory Visit: Payer: Self-pay | Admitting: Family Medicine

## 2013-12-27 MED ORDER — OSELTAMIVIR PHOSPHATE 75 MG PO CAPS: 75.0000 mg | ORAL_CAPSULE | Freq: Two times a day (BID) | ORAL | Status: DC

## 2014-04-09 IMAGING — CT CT ABD-PELV W/ CM
2 of 4 series · 14 of 32 positions shown, 19 images · IV contrast (water/omni  & 100ml omni 300)
Comparison: 10/29/2009

CLINICAL DATA: Bilateral abdominal pain.  Nausea.  Fever.
Continued pain since previous dB visit last night.  The

CT ABDOMEN AND PELVIS WITH CONTRAST
TECHNIQUE: Multidetector CT imaging of the abdomen and pelvis was
performed following the standard protocol during bolus
administration of intravenous contrast.
Contrast: 80mL OMNIPAQUE IOHEXOL 300 MG/ML  SOLN

[Series 2: routine abdomen · axial · 0.76mm/px · z∈[-448,-138]mm · 7 of 84 slices shown, 12 images]
[im 11/84  soft-tissue]
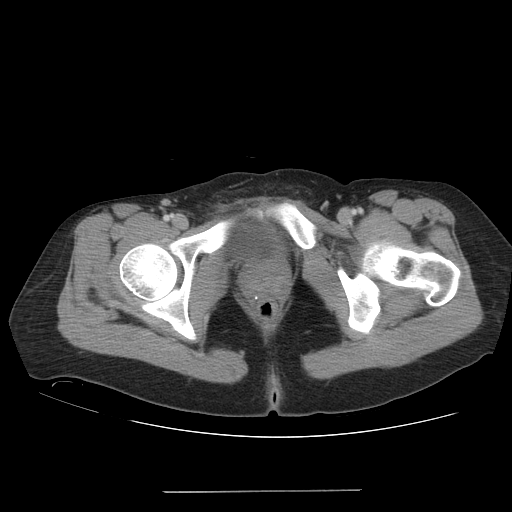
[im 11/84  bone]
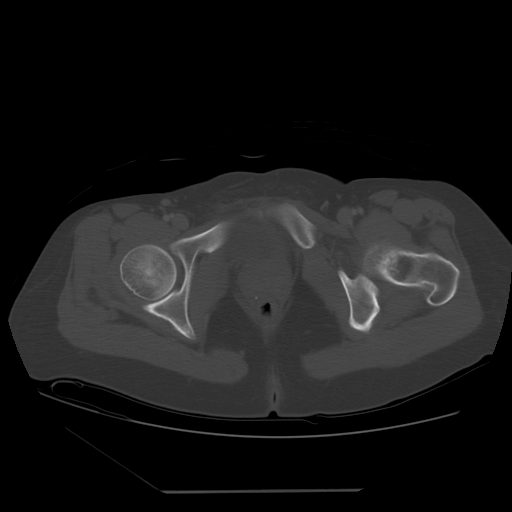
[im 21/84  soft-tissue]
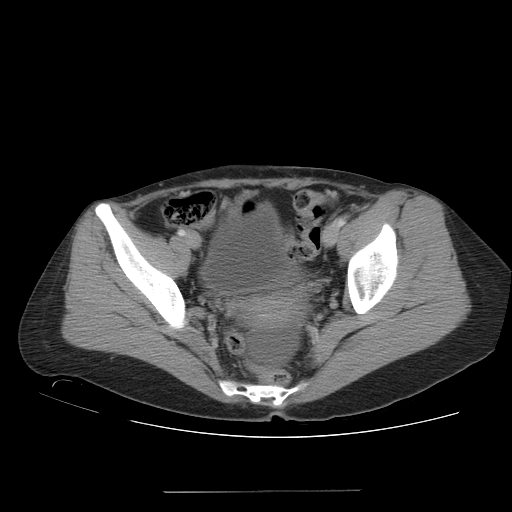
[im 32/84  soft-tissue]
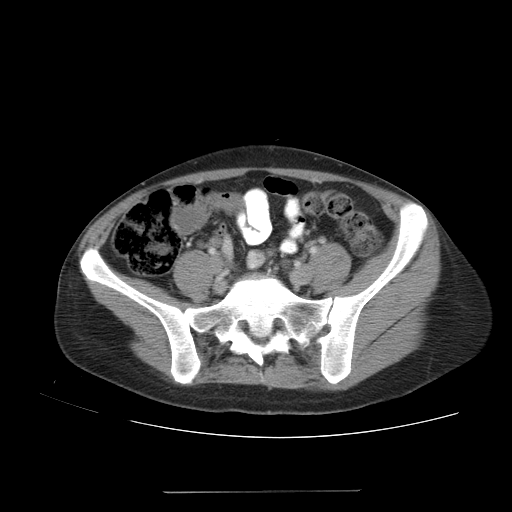
[im 42/84  soft-tissue]
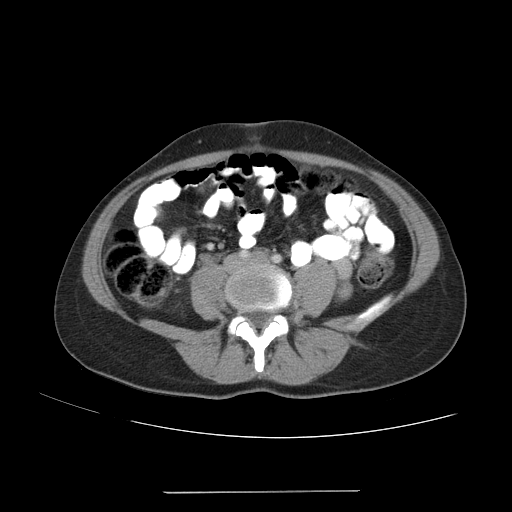
[im 42/84  lung]
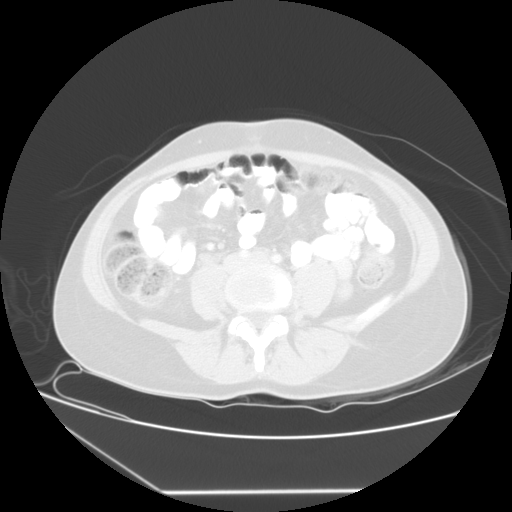
[im 52/84  soft-tissue]
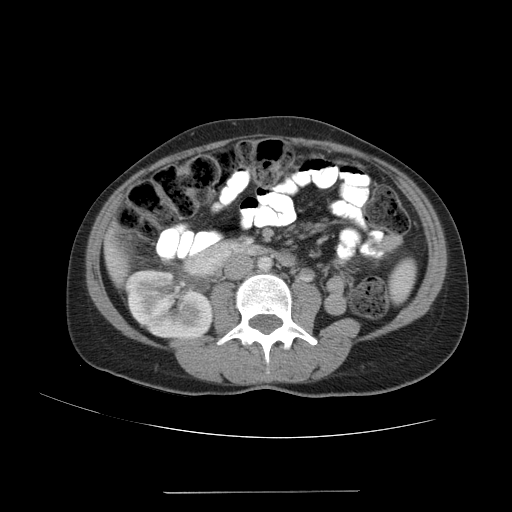
[im 52/84  lung]
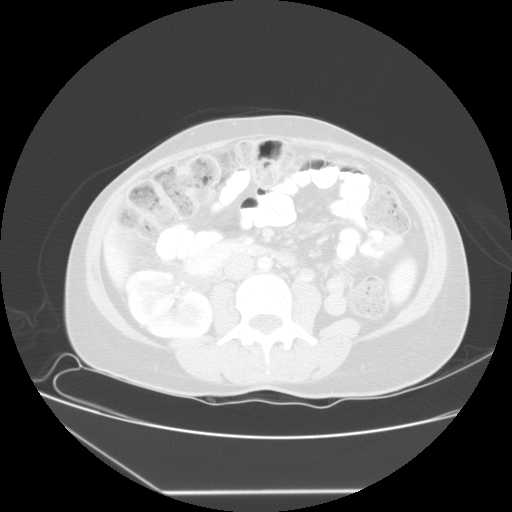
[im 63/84  soft-tissue]
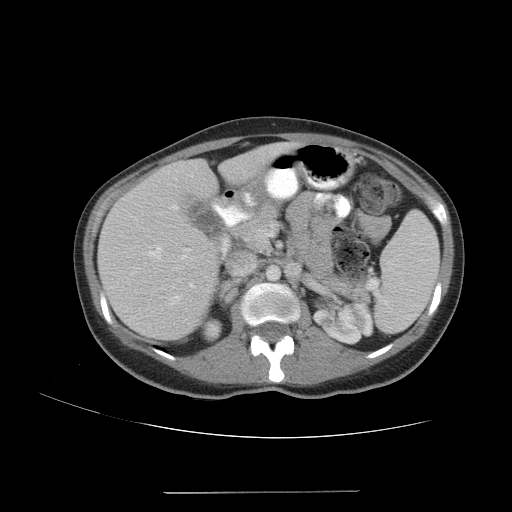
[im 63/84  lung]
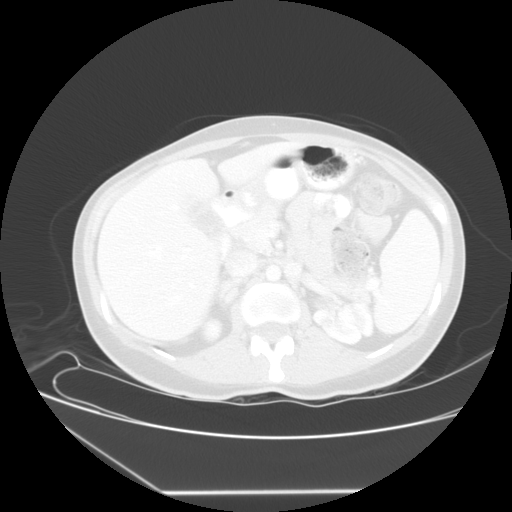
[im 73/84  soft-tissue]
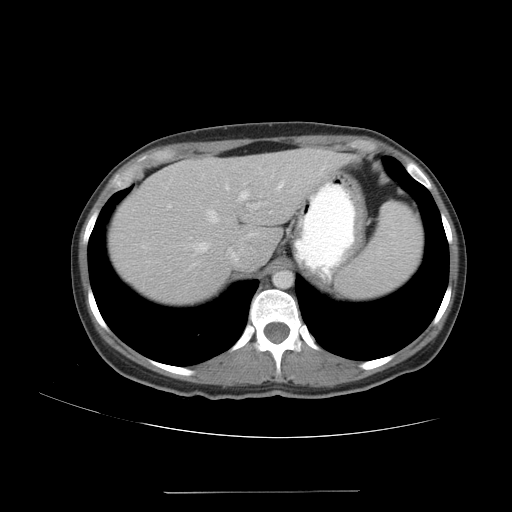
[im 73/84  lung]
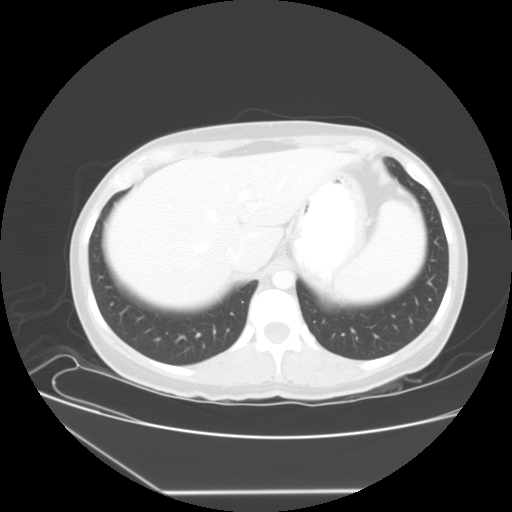

[Series 401: sagittals · sagittal · 0.83mm/px · 7 of 99 slices shown]
[im 10/99  soft-tissue]
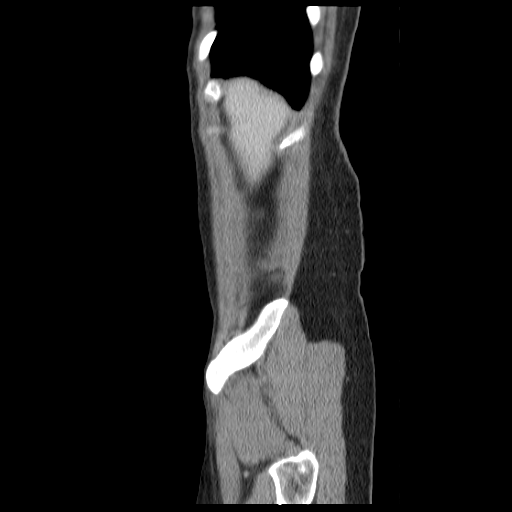
[im 20/99  soft-tissue]
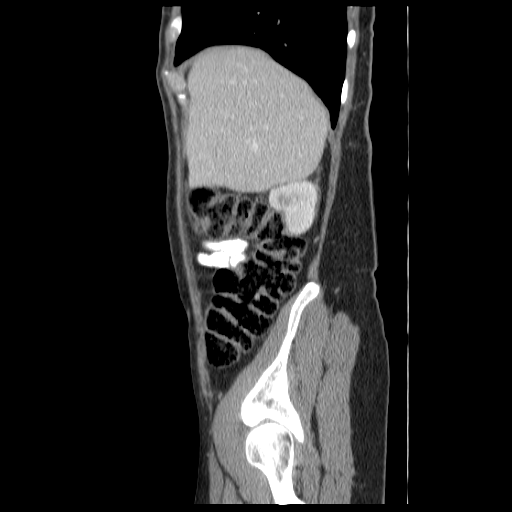
[im 30/99  soft-tissue]
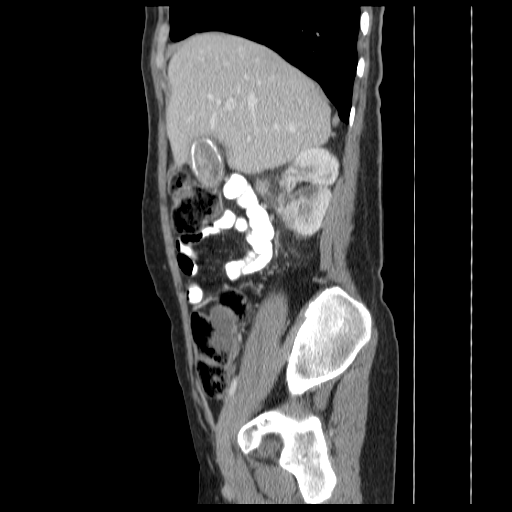
[im 40/99  soft-tissue]
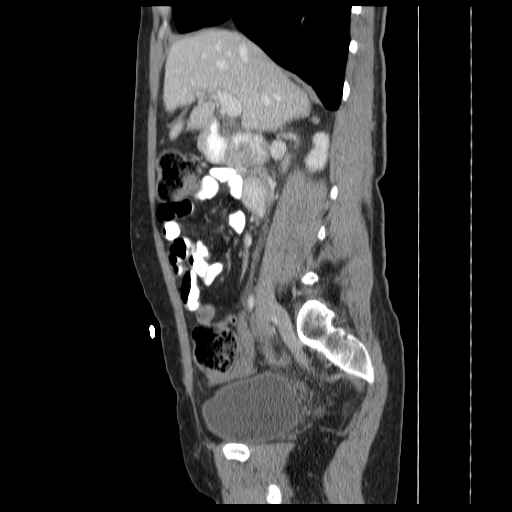
[im 59/99  soft-tissue]
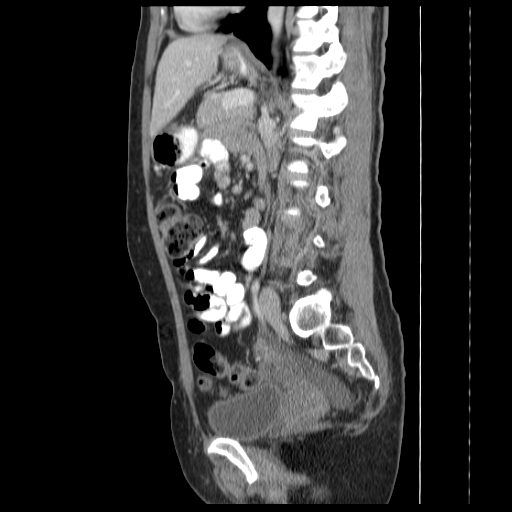
[im 69/99  soft-tissue]
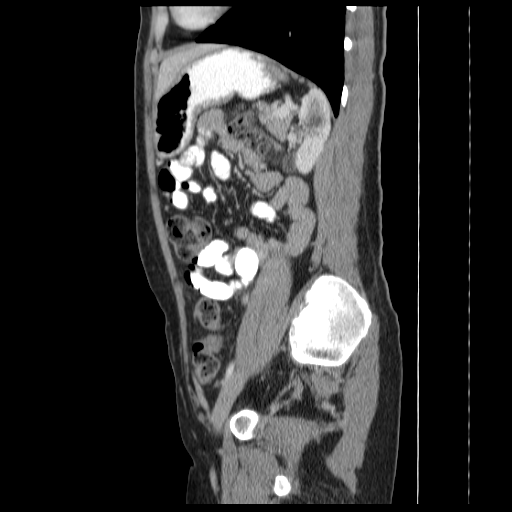
[im 79/99  soft-tissue]
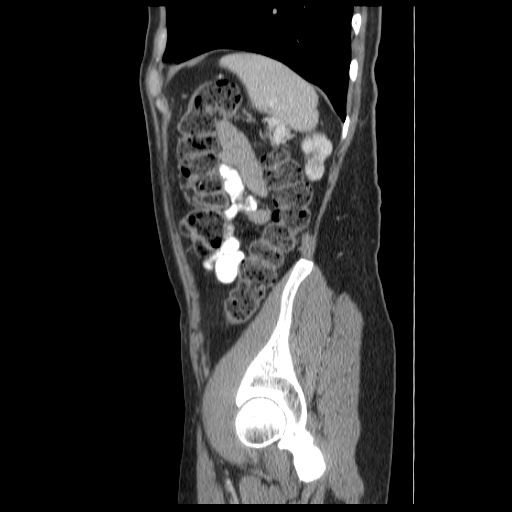

[14 of 32 positions shown; findings below may reference images not displayed]

FINDINGS: The lung bases are clear.

A large stone fills the gallbladder.  No gallbladder wall
thickening or infiltration.  No bile duct dilatation.  The liver,
spleen, pancreas, adrenal glands, abdominal aorta, and
retroperitoneal lymph nodes are unremarkable. Atrophy and scarring
of the left kidney.  Fetal lobulation of both kidneys.  No
hydronephrosis.  The diffusely stool filled colon without
distension.  The stomach and small bowel are not abnormally
distended.  No bowel wall thickening is appreciated.  No free air
or free fluid in the abdomen.  Small umbilical hernia containing
fat.

Pelvis:  There is fluid around the left adnexa extending posterior
to the uterus.  There is a heterogeneous curvilinear enhancement in
the left ovary.  Changes suggest an involuting cyst with
physiologic fluid.  Follow-up ultrasound in 6-12 weeks may be
useful for correlation and demonstrate resolution.  The uterus is
not enlarged.  No right adnexal mass or fluid.  The appendix is
normal.  No diverticulitis.  No significant pelvic lymphadenopathy.
Subcutaneous gas collection in the left gluteal fat likely
representing injection site.  Normal alignment of the lumbar
vertebrae.
IMPRESSION: Cholelithiasis with large gallstone filling the gallbladder.
Diffusely stool filled colon may represent constipation.  Left
adnexal changes suggesting involuting cyst with physiologic fluid.
Consider follow-up ultrasound to demonstrate resolution.

## 2014-06-06 ENCOUNTER — Other Ambulatory Visit: Payer: Self-pay | Admitting: Family Medicine

## 2014-06-07 NOTE — Telephone Encounter (Signed)
May refill this +4 additional refills 

## 2014-06-07 NOTE — Telephone Encounter (Signed)
Last seen 10/18/13

## 2014-07-22 ENCOUNTER — Other Ambulatory Visit: Payer: Self-pay | Admitting: Family Medicine

## 2014-07-22 NOTE — Telephone Encounter (Signed)
Seen 10/18/13

## 2014-08-15 ENCOUNTER — Encounter (HOSPITAL_COMMUNITY): Payer: Self-pay | Admitting: Emergency Medicine

## 2014-08-15 ENCOUNTER — Emergency Department (HOSPITAL_COMMUNITY): Payer: Medicaid Other

## 2014-08-15 ENCOUNTER — Emergency Department (HOSPITAL_COMMUNITY)
Admission: EM | Admit: 2014-08-15 | Discharge: 2014-08-15 | Disposition: A | Discharge status: Home / Self Care | Payer: Medicaid Other | Attending: Emergency Medicine | Admitting: Emergency Medicine

## 2014-08-15 DIAGNOSIS — R0602 Shortness of breath: Secondary | ICD-10-CM | POA: Diagnosis not present

## 2014-08-15 DIAGNOSIS — Z87448 Personal history of other diseases of urinary system: Secondary | ICD-10-CM | POA: Diagnosis not present

## 2014-08-15 DIAGNOSIS — R42 Dizziness and giddiness: Secondary | ICD-10-CM | POA: Insufficient documentation

## 2014-08-15 DIAGNOSIS — Z79899 Other long term (current) drug therapy: Secondary | ICD-10-CM | POA: Insufficient documentation

## 2014-08-15 DIAGNOSIS — R0789 Other chest pain: Secondary | ICD-10-CM | POA: Insufficient documentation

## 2014-08-15 DIAGNOSIS — Z8679 Personal history of other diseases of the circulatory system: Secondary | ICD-10-CM | POA: Insufficient documentation

## 2014-08-15 DIAGNOSIS — K219 Gastro-esophageal reflux disease without esophagitis: Secondary | ICD-10-CM | POA: Insufficient documentation

## 2014-08-15 DIAGNOSIS — R079 Chest pain, unspecified: Secondary | ICD-10-CM | POA: Diagnosis present

## 2014-08-15 DIAGNOSIS — R209 Unspecified disturbances of skin sensation: Secondary | ICD-10-CM | POA: Diagnosis not present

## 2014-08-15 DIAGNOSIS — Z7982 Long term (current) use of aspirin: Secondary | ICD-10-CM | POA: Diagnosis not present

## 2014-08-15 DIAGNOSIS — G4700 Insomnia, unspecified: Secondary | ICD-10-CM | POA: Insufficient documentation

## 2014-08-15 DIAGNOSIS — Z3202 Encounter for pregnancy test, result negative: Secondary | ICD-10-CM | POA: Diagnosis not present

## 2014-08-15 LAB — CBC WITH DIFFERENTIAL/PLATELET
BASOS ABS: 0 10*3/uL (ref 0.0–0.1)
Basophils Relative: 0 % (ref 0–1)
Eosinophils Absolute: 0.1 10*3/uL (ref 0.0–0.7)
Eosinophils Relative: 2 % (ref 0–5)
HEMATOCRIT: 40.8 % (ref 36.0–46.0)
HEMOGLOBIN: 13.7 g/dL (ref 12.0–15.0)
LYMPHS ABS: 1.7 10*3/uL (ref 0.7–4.0)
LYMPHS PCT: 32 % (ref 12–46)
MCH: 32 pg (ref 26.0–34.0)
MCHC: 33.6 g/dL (ref 30.0–36.0)
MCV: 95.3 fL (ref 78.0–100.0)
MONO ABS: 0.3 10*3/uL (ref 0.1–1.0)
MONOS PCT: 6 % (ref 3–12)
Neutro Abs: 3.2 10*3/uL (ref 1.7–7.7)
Neutrophils Relative %: 60 % (ref 43–77)
Platelets: 178 10*3/uL (ref 150–400)
RBC: 4.28 MIL/uL (ref 3.87–5.11)
RDW: 12.2 % (ref 11.5–15.5)
WBC: 5.3 10*3/uL (ref 4.0–10.5)

## 2014-08-15 LAB — BASIC METABOLIC PANEL
Anion gap: 12 (ref 5–15)
BUN: 6 mg/dL (ref 6–23)
CO2: 28 meq/L (ref 19–32)
CREATININE: 1.35 mg/dL — AB (ref 0.50–1.10)
Calcium: 9.2 mg/dL (ref 8.4–10.5)
Chloride: 101 mEq/L (ref 96–112)
GFR calc Af Amer: 58 mL/min — ABNORMAL LOW (ref >90)
GFR calc non Af Amer: 50 mL/min — ABNORMAL LOW (ref >90)
GLUCOSE: 70 mg/dL (ref 70–99)
Potassium: 3.8 mEq/L (ref 3.7–5.3)
Sodium: 141 mEq/L (ref 137–147)

## 2014-08-15 LAB — URINALYSIS, ROUTINE W REFLEX MICROSCOPIC
BILIRUBIN URINE: NEGATIVE
Glucose, UA: NEGATIVE mg/dL
HGB URINE DIPSTICK: NEGATIVE
Ketones, ur: NEGATIVE mg/dL
Nitrite: NEGATIVE
Protein, ur: NEGATIVE mg/dL
SPECIFIC GRAVITY, URINE: 1.007 (ref 1.005–1.030)
Urobilinogen, UA: 0.2 mg/dL (ref 0.0–1.0)
pH: 7 (ref 5.0–8.0)

## 2014-08-15 LAB — URINE MICROSCOPIC-ADD ON

## 2014-08-15 LAB — PRO B NATRIURETIC PEPTIDE: PRO B NATRI PEPTIDE: 92.4 pg/mL (ref 0–125)

## 2014-08-15 LAB — TROPONIN I: Troponin I: <0.3 ng/mL (ref <0.30)

## 2014-08-15 LAB — POC URINE PREG, ED: PREG TEST UR: NEGATIVE

## 2014-08-15 LAB — I-STAT TROPONIN, ED: Troponin i, poc: 0.01 ng/mL (ref 0.00–0.08)

## 2014-08-15 MED ORDER — ASPIRIN EC 81 MG PO TBEC: 81.0000 mg | DELAYED_RELEASE_TABLET | Freq: Every day | ORAL | Status: DC

## 2014-08-15 MED ORDER — TRAMADOL HCL 50 MG PO TABS: 50.0000 mg | ORAL_TABLET | Freq: Four times a day (QID) | ORAL | Status: DC | PRN

## 2014-08-15 MED ORDER — OXYCODONE-ACETAMINOPHEN 5-325 MG PO TABS
1.0000 | ORAL_TABLET | Freq: Once | ORAL | Status: AC
  Administered 2014-08-15: 1 via ORAL
  Filled 2014-08-15: qty 1

## 2014-08-15 MED ORDER — IBUPROFEN 400 MG PO TABS: 400.0000 mg | ORAL_TABLET | Freq: Four times a day (QID) | ORAL | Status: DC | PRN

## 2014-08-15 NOTE — ED Notes (Signed)
Pt placed in Gown. Pt hooked up to BP, Pulse Ox and 5 Lead. Pt call bell given.

## 2014-08-15 NOTE — ED Notes (Signed)
Chest pain worse with deep inspiration; reports dizziness that does not necessarily correspond with the chest pain. Reports dizziness comes with migraines and not at any other times; reports SOB. Denies smoking or BC.

## 2014-08-15 NOTE — ED Notes (Signed)
Pt in X ray

## 2014-08-15 NOTE — ED Provider Notes (Signed)
CSN: [id number]     Arrival date & time 08/15/14  1141 History   First MD Initiated Contact with Patient 08/15/14 1156     Chief Complaint  Patient presents with  . Chest Pain     (Consider location/radiation/quality/duration/timing/severity/associated sxs/prior Treatment) HPI Comments: Pt comes in with cc of chest pain. Hx of GERD. Pt reports that for the last 3 days, she has been having chest pain - left sided, described as sharp pain. She also has numbness in her left arm, going down the hand. Pt has hx of neck pain, but the sx is not reproduced with movement of the neck. Pt's chest pain is intermittent, unprovoked, and has no specific aggravating or relieving factors. Pt has some dyspnea and dizziness associated with the chest pain. No hx of premature CAD, no substance abuse, no smoking hx.  Patient is a 33 y.o. female presenting with chest pain. The history is provided by the patient.  Chest Pain Associated symptoms: dizziness and shortness of breath   Associated symptoms: no abdominal pain, no headache, no nausea and not vomiting     Past Medical History  Diagnosis Date  . Migraine   . Blood transfusion without reported diagnosis   . GERD (gastroesophageal reflux disease)     takes Prilosec as needed  . Cholelithiasis   . Constipation, chronic   . 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 Migraine headache   . Chronic insomnia    Past Surgical History  Procedure Laterality Date  . Renal artery stent    . Abdominal hysterectomy  08/27/2010  . Cholecystectomy  01/11/2013    Procedure: LAPAROSCOPIC CHOLECYSTECTOMY;  Surgeon: Shelly Rubenstein, MD;  Location: [facility];  Service: General;  Laterality: N/A;  Laparoscopic cholecystectomy  . Eye surgery Left     at 6mos old   Family History  Problem Relation Age of Onset  . Cancer Maternal Grandmother   . Cirrhosis Maternal Grandfather    History  Substance Use Topics  . Smoking status: Never  Smoker   . Smokeless tobacco: Never Used  . Alcohol Use: No   OB History   Grav Para Term Preterm Abortions TAB SAB Ect Mult Living                 Review of Systems  Constitutional: Negative for activity change.  Respiratory: Positive for shortness of breath.   Cardiovascular: Positive for chest pain.  Gastrointestinal: Negative for nausea, vomiting and abdominal pain.  Genitourinary: Negative for dysuria.  Musculoskeletal: Negative for neck pain.  Skin: Negative for rash.  Neurological: Positive for dizziness. Negative for headaches.      Allergies  Doxycycline; Hydrocodone; Macrolides and ketolides; and Trazodone and nefazodone  Home Medications   Prior to Admission medications   Medication Sig Start Date End Date Taking? Authorizing Provider  ibuprofen (ADVIL,MOTRIN) 200 MG tablet Take 600-800 mg by mouth every 6 (six) hours as needed (for pain).   Yes Historical Provider, MD  zolpidem (AMBIEN) 5 MG tablet Take 5 mg by mouth at bedtime as needed for sleep.   Yes Historical Provider, MD  aspirin EC 81 MG tablet Take 1 tablet (81 mg total) by mouth daily. 08/15/14   Derwood Kaplan, MD  ibuprofen (ADVIL,MOTRIN) 400 MG tablet Take 1 tablet (400 mg total) by mouth every 6 (six) hours as needed. 08/15/14   Derwood Kaplan, MD  traMADol (ULTRAM) 50 MG tablet Take 1 tablet (50  mg total) by mouth every 6 (six) hours as needed. 08/15/14   Raveena Hebdon Rhunette Croft, MD   BP 103/60  Pulse 53  Temp(Src) 97.9 F (36.6 C) (Oral)  Resp 18  Ht  (1.626 m)  Wt 130 lb (58.968 kg)  BMI 22.30 kg/m2  SpO2 100% Physical Exam  Nursing note and vitals reviewed. Constitutional: She is oriented to person, place, and time. She appears well-developed.  HENT:  Head: Normocephalic and atraumatic.  Eyes: Conjunctivae and EOM are normal. Pupils are equal, round, and reactive to light.  Neck: Normal range of motion. Neck supple.  Cardiovascular: Normal rate, regular rhythm, normal heart sounds and intact  distal pulses.   No murmur heard. Pulmonary/Chest: Effort normal. No respiratory distress. She has no wheezes.  Abdominal: Soft. Bowel sounds are normal. She exhibits no distension. There is no tenderness. There is no rebound and no guarding.  Neurological: She is alert and oriented to person, place, and time.  Skin: Skin is warm and dry.    ED Course  Procedures (including critical care time) Labs Review Labs Reviewed  BASIC METABOLIC PANEL - Abnormal; Notable for the following:    Creatinine, Ser 1.35 (*)    GFR calc non Af Amer 50 (*)    GFR calc Af Amer 58 (*)    All other components within normal limits  URINALYSIS, ROUTINE W REFLEX MICROSCOPIC - Abnormal; Notable for the following:    Leukocytes, UA SMALL (*)    All other components within normal limits  URINE MICROSCOPIC-ADD ON - Abnormal; Notable for the following:    Squamous Epithelial / LPF FEW (*)    Bacteria, UA MANY (*)    All other components within normal limits  CBC WITH DIFFERENTIAL  TROPONIN I  PRO B NATRIURETIC PEPTIDE  POC URINE PREG, ED  Rosezena Sensor, ED    Imaging Review Dg Chest 2 View  08/15/2014   CLINICAL DATA:  Intermittent chest pain for 3 days. Numbness of the left arm.  EXAM: CHEST  2 VIEW  COMPARISON:  06/24/2005  FINDINGS: Old granulomatous disease, not changed from 2006. The lungs appear otherwise clear.  Cardiac and mediastinal margins appear normal.  No pleural effusion.  IMPRESSION: 1. No significant abnormality identified. 2. Old granulomatous disease.   Electronically Signed   By: Herbie Baltimore M.D.   On: 08/15/2014 12:45     EKG Interpretation   Date/Time:  Monday August 15 2014 11:45:10 EDT Ventricular Rate:  56 PR Interval:  112 QRS Duration: 84 QT Interval:  390 QTC Calculation: 376 R Axis:   84 Text Interpretation:  Normal sinus rhythm Low voltage QRS Abnormal ECG  Confirmed by Rhunette Croft, MD, Janey Genta [phone]) on 08/15/2014 12:03:15 PM      MDM   Final diagnoses:   Atypical chest pain    Differential diagnosis includes: ACS syndrome CHF exacerbation Valvular disorder Myocarditis Pericarditis Pericardial effusion Pneumonia Pleural effusion Pulmonary edema PE Anemia Musculoskeletal pain  Pt comes in with cc of chest pain. Not reproducible with palpation. Pt's initial EKG showed low voltage, so bedside echo was performed, and is normal. Pain is intermittent, atypical, HEART score is 0. Given the pain is moving to the left side, i still want to be cautious and have requested Cards f/u.   Pt has no risk factors for DVT, PE. She is PERC negative.  Return precautions have been discussed.    EMERGENCY DEPARTMENT [place] CARDIAC EXAM "Study: Limited Ultrasound of the heart and pericardium"  INDICATIONS: Low  voltage EKG with chest pain and dib Multiple views of the heart and pericardium were obtained in real-time with a multi-frequency probe.  PERFORMED ZO:[id number]  IMAGES ARCHIVED?: Yes  FINDINGS: No pericardial effusion and Normal contractility  LIMITATIONS:  Emergent procedure  VIEWS USED: Subcostal 4 chamber and Parasternal long axis  INTERPRETATION: Pericardial effusioin absent and Normal contractility      Derwood Kaplan, MD 08/15/14 1719

## 2014-08-15 NOTE — ED Notes (Signed)
Lab at bedside

## 2014-08-15 NOTE — ED Notes (Signed)
MD at bedside with RN chaperoning bedside US of heart.

## 2014-08-15 NOTE — ED Notes (Signed)
Pt reports left CP intermittently for 3 days with numbness into left arm. Reports hx of pinched nerve in her neck. Denies n/v. Pt is a x 4. No cardiac hx. VSS

## 2014-08-15 NOTE — Discharge Instructions (Signed)
We saw you in the ER for the chest pain/shortness of breath/DIZZINESS. All of our cardiac workup is normal, including labs, EKG and chest X-RAY are normal. We are not sure what is causing your discomfort, but we feel comfortable sending you home at this time.  The workup in the ER is not complete, and we REQUEST THAT YOU CALL CARDIOLOGIST AND SET UP AND APPOINTMENT. Also, PLEASE SEE YOUR PRIMARY CARE DOCTOR IN 1 WEEK, INCASE THE PAIN IS from your breath tissue.  Please return to the ER if your symptoms worsen; you have increased pain, shortness of breath, you pass out, have severe palpation.    Chest Pain (Nonspecific) It is often hard to give a specific diagnosis for the cause of chest pain. There is always a chance that your pain could be related to something serious, such as a heart attack or a blood clot in the lungs. You need to follow up with your health care provider for further evaluation. CAUSES   Heartburn.  Pneumonia or bronchitis.  Anxiety or stress.  Inflammation around your heart (pericarditis) or lung (pleuritis or pleurisy).  A blood clot in the lung.  A collapsed lung (pneumothorax). It can develop suddenly on its own (spontaneous pneumothorax) or from trauma to the chest.  Shingles infection (herpes zoster virus). The chest wall is composed of bones, muscles, and cartilage. Any of these can be the source of the pain.  The bones can be bruised by injury.  The muscles or cartilage can be strained by coughing or overwork.  The cartilage can be affected by inflammation and become sore (costochondritis). DIAGNOSIS  Lab tests or other studies may be needed to find the cause of your pain. Your health care provider may have you take a test called an ambulatory electrocardiogram (ECG). An ECG records your heartbeat patterns over a 24-hour period. You may also have other tests, such as:  Transthoracic echocardiogram (TTE). During echocardiography, sound waves are used to  evaluate how blood flows through your heart.  Transesophageal echocardiogram (TEE).  Cardiac monitoring. This allows your health care provider to monitor your heart rate and rhythm in real time.  Holter monitor. This is a portable device that records your heartbeat and can help diagnose heart arrhythmias. It allows your health care provider to track your heart activity for several days, if needed.  Stress tests by exercise or by giving medicine that makes the heart beat faster. TREATMENT   Treatment depends on what may be causing your chest pain. Treatment may include:  Acid blockers for heartburn.  Anti-inflammatory medicine.  Pain medicine for inflammatory conditions.  Antibiotics if an infection is present.  You may be advised to change lifestyle habits. This includes stopping smoking and avoiding alcohol, caffeine, and chocolate.  You may be advised to keep your head raised (elevated) when sleeping. This reduces the chance of acid going backward from your stomach into your esophagus. Most of the time, nonspecific chest pain will improve within 2-3 days with rest and mild pain medicine.  HOME CARE INSTRUCTIONS   If antibiotics were prescribed, take them as directed. Finish them even if you start to feel better.  For the next few days, avoid physical activities that bring on chest pain. Continue physical activities as directed.  Do not use any tobacco products, including cigarettes, chewing tobacco, or electronic cigarettes.  Avoid drinking alcohol.  Only take medicine as directed by your health care provider.  Follow your health care provider's suggestions for further testing if  your chest pain does not go away.  Keep any follow-up appointments you made. If you do not go to an appointment, you could develop lasting (chronic) problems with pain. If there is any problem keeping an appointment, call to reschedule. SEEK MEDICAL CARE IF:   Your chest pain does not go away,  even after treatment.  You have a rash with blisters on your chest.  You have a fever. SEEK IMMEDIATE MEDICAL CARE IF:   You have increased chest pain or pain that spreads to your arm, neck, jaw, back, or abdomen.  You have shortness of breath.  You have an increasing cough, or you cough up blood.  You have severe back or abdominal pain.  You feel nauseous or vomit.  You have severe weakness.  You faint.  You have chills. This is an emergency. Do not wait to see if the pain will go away. Get medical help at once. Call your local emergency services (911 in [place] Do not drive yourself to the hospital. MAKE SURE YOU:   Understand these instructions.  Will watch your condition.  Will get help right away if you are not doing well or get worse. Document Released: 09/11/2005 Document Revised: 12/07/2013 Document Reviewed: 07/07/2008 [facility] Patient Information 2015 [place], [place]. This information is not intended to replace advice given to you by your health care provider. Make sure you discuss any questions you have with your health care provider.  Chest Wall Pain Chest wall pain is pain in or around the bones and muscles of your chest. It may take up to 6 weeks to get better. It may take longer if you must stay physically active in your work and activities.  CAUSES  Chest wall pain may happen on its own. However, it may be caused by:  A viral illness like the flu.  Injury.  Coughing.  Exercise.  Arthritis.  Fibromyalgia.  Shingles. HOME CARE INSTRUCTIONS   Avoid overtiring physical activity. Try not to strain or perform activities that cause pain. This includes any activities using your chest or your abdominal and side muscles, especially if heavy weights are used.  Put ice on the sore area.  Put ice in a plastic bag.  Place a towel between your skin and the bag.  Leave the ice on for 15-20 minutes per hour while awake for the first 2 days.  Only take  over-the-counter or prescription medicines for pain, discomfort, or fever as directed by your caregiver. SEEK IMMEDIATE MEDICAL CARE IF:   Your pain increases, or you are very uncomfortable.  You have a fever.  Your chest pain becomes worse.  You have new, unexplained symptoms.  You have nausea or vomiting.  You feel sweaty or lightheaded.  You have a cough with phlegm (sputum), or you cough up blood. MAKE SURE YOU:   Understand these instructions.  Will watch your condition.  Will get help right away if you are not doing well or get worse. Document Released: 12/02/2005 Document Revised: 02/24/2012 Document Reviewed: 07/29/2011 [facility] Patient Information 2015 [place], [place]. This information is not intended to replace advice given to you by your health care provider. Make sure you discuss any questions you have with your health care provider.

## 2014-08-17 ENCOUNTER — Telehealth: Payer: Self-pay | Admitting: Family Medicine

## 2014-08-17 NOTE — Telephone Encounter (Signed)
I believe that is appropriate. If further troubles are identified at office visit we will consult cardiology

## 2014-08-17 NOTE — Telephone Encounter (Signed)
Patient seen in ER Monday and has follow up office visit with [facility] tomorrow. Patient already has follow up with Cardiology scheduled.

## 2014-08-17 NOTE — Telephone Encounter (Signed)
Patient would like referral to a different pain management doc. She is not satisfied with the office she is at now in [place].

## 2014-08-18 ENCOUNTER — Encounter: Payer: Self-pay | Admitting: Nurse Practitioner

## 2014-08-18 ENCOUNTER — Ambulatory Visit (INDEPENDENT_AMBULATORY_CARE_PROVIDER_SITE_OTHER): Payer: Medicaid Other | Admitting: Nurse Practitioner

## 2014-08-18 VITALS — BP 118/72 | Ht 64.0 in | Wt 129.0 lb

## 2014-08-18 DIAGNOSIS — M25519 Pain in unspecified shoulder: Secondary | ICD-10-CM

## 2014-08-18 DIAGNOSIS — R071 Chest pain on breathing: Secondary | ICD-10-CM

## 2014-08-18 DIAGNOSIS — M62838 Other muscle spasm: Secondary | ICD-10-CM

## 2014-08-18 DIAGNOSIS — G5692 Unspecified mononeuropathy of left upper limb: Secondary | ICD-10-CM

## 2014-08-18 DIAGNOSIS — M25512 Pain in left shoulder: Secondary | ICD-10-CM

## 2014-08-18 DIAGNOSIS — N390 Urinary tract infection, site not specified: Secondary | ICD-10-CM

## 2014-08-18 DIAGNOSIS — G5690 Unspecified mononeuropathy of unspecified upper limb: Secondary | ICD-10-CM

## 2014-08-18 DIAGNOSIS — R0789 Other chest pain: Secondary | ICD-10-CM

## 2014-08-18 MED ORDER — CIPROFLOXACIN HCL 500 MG PO TABS: 500.0000 mg | ORAL_TABLET | Freq: Two times a day (BID) | ORAL | Status: DC

## 2014-08-18 MED ORDER — ZOLPIDEM TARTRATE 10 MG PO TABS: 10.0000 mg | ORAL_TABLET | Freq: Every evening | ORAL | Status: DC | PRN

## 2014-08-18 NOTE — Progress Notes (Signed)
Subjective:  Presents for recheck after ER visit on 8/31 for atypical chest pain. Had a cardiac workup which was negative. Chest x-ray was clear. Complaints of localized left anterior chest wall pain which when it occurs is worse with deep breath movement and cough. No specific history of injury. No regular cough, no fever. Will have left chest pain and left shoulder pain occurring at the same time then left arm will start to hurt and go numb. Has a history of some neck issues MRI shows problems on the right side which is asymptomatic. Complaints of left shoulder pain. Also noted abnormal urine microscopic from ED visit. Complaints of urgency and frequency. No dysuria. No hematuria. No CVA or flank tenderness. Some chills, no documented fever. Last document UTI was a year and a half ago. With increased pain patient is having more difficulty sleeping, Ambien 5 mg is not working.  Objective:   BP 118/72  Ht  (1.626 m)  Wt 129 lb (58.514 kg)  BMI 22.13 kg/m2 NAD. Alert, oriented. Lungs clear. Heart regular rate rhythm. Tight tender muscles noted along the upper back and neck area more so on the left side. No pain with palpation of the anterior chest wall. Tenderness noted in the left shoulder joint particularly in the a.c. area and anterior joint line. Active ROM of the left shoulder with some impingement sign noted. Pain with full range of motion. Strong radial pulse. Hand and arm strength 5+ bilateral. Sensation grossly intact. Urine microscopic dated 8/31 showed few epis, 11-20 WBCs and many bacteria.  Assessment:  Problem List Items Addressed This Visit     Musculoskeletal and Integument   Muscle spasms of head and/or neck     Other   Left shoulder pain - Primary    Other Visit Diagnoses   Neuropathy, arm, left        Relevant Medications       zolpidem (AMBIEN) tablet    Chest wall pain        Urinary tract infection without hematuria, site unspecified           Plan:  Meds ordered  this encounter  Medications  . zolpidem (AMBIEN) 10 MG tablet    Sig: Take 1 tablet (10 mg total) by mouth at bedtime as needed for sleep.    Dispense:  30 tablet    Refill:  2    Order Specific Question:  Supervising Provider    Answer:  [name] [[id number]]   ice/heat to the upper back and neck area. Exercises and stretching. Her insurance will not cover a TENS unit. Recommend OTC TENS unit. Switch to Ambien 10 mg at bedtime. Will refer to orthopedic specialist for evaluation of left shoulder pain. Cipro 500 twice a day for UTI. Call back if symptoms worsen or persist.

## 2014-08-18 NOTE — Patient Instructions (Signed)
Icy hot smart relief TENS unit 

## 2014-08-25 ENCOUNTER — Other Ambulatory Visit: Payer: Self-pay | Admitting: Nurse Practitioner

## 2014-08-25 ENCOUNTER — Telehealth: Payer: Self-pay | Admitting: Family Medicine

## 2014-08-25 DIAGNOSIS — M25512 Pain in left shoulder: Secondary | ICD-10-CM

## 2014-08-25 DIAGNOSIS — M5020 Other cervical disc displacement, unspecified cervical region: Secondary | ICD-10-CM

## 2014-08-25 NOTE — Telephone Encounter (Signed)
Spoke with [place]. Her ortho referral is now in . Will see if they have a pain specialist within their practice that we can refer her to.

## 2014-08-25 NOTE — Telephone Encounter (Signed)
Patient was advised last week that we were unable to write narcotics for her and would not be writing her pain med rxs  - she would have to stick with her current plan of care and pain doctor till established with new pain management.

## 2014-08-25 NOTE — Telephone Encounter (Signed)
Pt asking about referral.  Pt mentioned lots of stuff during call.  States that [place] said we'd send her to ortho & let them do MRI (it's mentioned in OV note but no referral put in)  States she also needs referral to new pain management and can we write her something for pain. States that she's been taking Tylenol PM cause "they told me it's stronger than Advil"  (not sure who "they" are)   Pt states she has to work but can't due to the pain.  Even mentioned that Percocet didn't work before  Please initiate appropriate referral in system   Please let pt know if we can give pain medicine also

## 2014-08-26 NOTE — Telephone Encounter (Signed)
Discussed with patient. Patient verbalized understanding. 

## 2014-08-30 ENCOUNTER — Telehealth: Payer: Self-pay | Admitting: Family Medicine

## 2014-08-30 NOTE — Telephone Encounter (Signed)
Patient advised we are working on her referral currently and she needs to contact her current pain management doctor to get a refill of her pain meds.(Patient states she is still in good standing with current pain management doctor -she just doesn't want to go there)

## 2014-08-30 NOTE — Telephone Encounter (Signed)
Calling to check on referral to pain management and ortho for her shoulder.   Also, patient said that last week a message was put in to ask Heather Moon if she could write her anything for the pain that she was experiencing (please refer to message on 08/25/14), but no ever called her back regarding this answer.

## 2014-09-16 ENCOUNTER — Telehealth: Payer: Self-pay | Admitting: Family Medicine

## 2014-09-16 NOTE — Telephone Encounter (Signed)
Patient was seen by Dr. Jerl Santosalldorf @ [facility] on 09/02/14, had MRI, & has follow-up appt 10/12/14.  Patient also has appt with Dr. Pernell DupreAdams @ [facility] for 09/20/14.  Patient is aware of these appointments

## 2014-09-27 ENCOUNTER — Ambulatory Visit (INDEPENDENT_AMBULATORY_CARE_PROVIDER_SITE_OTHER): Payer: Medicaid Other | Admitting: Family Medicine

## 2014-09-27 ENCOUNTER — Encounter: Payer: Self-pay | Admitting: Family Medicine

## 2014-09-27 VITALS — BP 100/76 | Ht 64.0 in | Wt 133.0 lb

## 2014-09-27 DIAGNOSIS — M545 Low back pain, unspecified: Secondary | ICD-10-CM

## 2014-09-27 DIAGNOSIS — M542 Cervicalgia: Secondary | ICD-10-CM

## 2014-09-27 MED ORDER — OXYCODONE-ACETAMINOPHEN 10-325 MG PO TABS: 1.0000 | ORAL_TABLET | ORAL | Status: DC | PRN

## 2014-09-27 MED ORDER — NAPROXEN 500 MG PO TABS: 500.0000 mg | ORAL_TABLET | Freq: Two times a day (BID) | ORAL | Status: DC

## 2014-09-27 MED ORDER — CYCLOBENZAPRINE HCL 10 MG PO TABS: 10.0000 mg | ORAL_TABLET | Freq: Every day | ORAL | Status: DC

## 2014-09-27 NOTE — Progress Notes (Signed)
   Subjective:    Patient ID: Heather Moon, female    DOB: 1981/09/15, 33 y.o.   MRN: [id number]  Back Pain This is a new problem. The current episode started in the past 7 days. The problem occurs constantly. The problem is unchanged. The pain is present in the lumbar spine. The quality of the pain is described as stabbing. The pain radiates to the right thigh. The pain is moderate. The pain is the same all the time. Stiffness is present all day. Treatments tried: heating pad. The treatment provided no relief.  Patient states that she refuses to go back to the pain clinic. She refuses the epidural injection they are wanting to give her.   Lumbar back pain,rolling tree stumps, pain into left hip Tried ibuprofen   Review of Systems  Musculoskeletal: Positive for back pain.       Objective:   Physical Exam Patient lumbar area pain and discomfort mild increased pain with raising the left leg no other particular problems       Assessment & Plan:  Mild low back pain not sciatica stretching exercises anti-inflammatories muscle relaxer at night if necessary pain medication when absolutely necessary  Patient when saw pain specialist did not like going there and once her problem fixed but does not want to have injections I told her to be realistic that back trouble off and is not fixed it is often something you have to live with we will look at the MRI and if need be we'll refer her to a specialist for second opinion  This patient is a very challenging. She is nice but she is hoping there is something that can be done that will fix her neck problem but she has already tried Neurontin it did not help. She refused epidural shot. She uses pain medicine but she does not like the way it makes her feel. We have informed her that we do not want to be prescribing pain medicines on a ongoing basis.

## 2014-10-03 ENCOUNTER — Telehealth: Payer: Self-pay | Admitting: Family Medicine

## 2014-10-03 DIAGNOSIS — M542 Cervicalgia: Secondary | ICD-10-CM

## 2014-10-03 NOTE — Telephone Encounter (Addendum)
Calling to check on referral to pain management for 2nd opinion. She said the medicine that Dr. Lorin PicketScott prescribed her is not working.  She said that she does a lot of physical work for her job and has to work.  And also, since her medication is not working or fixing the problem, what else does Dr. Lorin PicketScott suggest she does until she is referred.  She said that if Dr. 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

## 2014-10-03 NOTE — Telephone Encounter (Signed)
Also, had last dose of pain medication today.

## 2014-10-04 ENCOUNTER — Telehealth: Payer: Self-pay | Admitting: *Deleted

## 2014-10-04 MED ORDER — OXYCODONE-ACETAMINOPHEN 10-325 MG PO TABS: 1.0000 | ORAL_TABLET | Freq: Three times a day (TID) | ORAL | Status: DC | PRN

## 2014-10-04 NOTE — Telephone Encounter (Signed)
Notified patient - best to avoid work that worsens her neck. Dr. Lorin PicketScott agrees she needs to work but severe labor will worsen the pain . If need letter we can do. 2- put in referral pain management 3- we were supposed to get her MRI report fom the ortho, I called there office and left message for medical records to please send report 4- may have Rx for percocet but to be written one tid prn pain not to exceed 3 per day, #60, pt will need to call when needing next script. Patient verbalized understanding and will pick up script after lunch.

## 2014-10-04 NOTE — Telephone Encounter (Signed)
1- best to avoid work that worsens her neck. I agree she needs to work but severe labor will worsen the pain . If need letter we can do. 2- put in referral pain management  3- we were supposed to get her MRI report fom the ortho, will need to call their office and get this 4- may have Rx for percocet but to be written one tid prn pain not to exceed 3 per day, #60, pt will need to call when needing next script

## 2014-10-04 NOTE — Telephone Encounter (Signed)
Patient walked in to pick up letter about work.

## 2014-10-05 NOTE — Telephone Encounter (Signed)
I prescribed to her Percocet that she could use 3 times per day. I truly don't want to have her on large number per day. I reviewed over the records from the orthopedics. Her MRI of the shoulder do not show significant problems. She has some referred pain from her neck but a neck MRI earlier last year did not show any severe disease. The patient stated to the nurse that she wanted a letter that would state that she is disabled so she can file for disability with Social Security. In my opinion I believe that this patient should get further evaluation with a specialist and I recommended for her to consider doing so. I did write a letter stating that she should avoid heavy lifting and vigorous work because of her problem. I truly doubt that Social Security will review her as completely disabled

## 2014-10-13 ENCOUNTER — Telehealth: Payer: Self-pay | Admitting: Family Medicine

## 2014-10-13 NOTE — Telephone Encounter (Signed)
Certainly sympathize with her pain but it is best to keep OV with specialist. thanks

## 2014-10-13 NOTE — Telephone Encounter (Signed)
FYI:Pt has appointment with new pain management Appt 11/15/14 @ 9:30am with Landry CorporalJanice Orsini @ [facility] [place] See referral under referrals tab in chart review for address, phone & fax numbers

## 2014-10-20 ENCOUNTER — Ambulatory Visit: Payer: Medicaid Other | Admitting: Family Medicine

## 2014-10-28 ENCOUNTER — Encounter (HOSPITAL_COMMUNITY): Payer: Self-pay | Admitting: Emergency Medicine

## 2014-10-28 ENCOUNTER — Emergency Department (INDEPENDENT_AMBULATORY_CARE_PROVIDER_SITE_OTHER)
Admission: EM | Admit: 2014-10-28 | Discharge: 2014-10-28 | Disposition: A | Discharge status: Home / Self Care | Payer: Medicaid Other | Source: Home / Self Care | Attending: Emergency Medicine | Admitting: Emergency Medicine

## 2014-10-28 DIAGNOSIS — J020 Streptococcal pharyngitis: Secondary | ICD-10-CM | POA: Diagnosis not present

## 2014-10-28 MED ORDER — AMOXICILLIN 500 MG PO CAPS: 500.0000 mg | ORAL_CAPSULE | Freq: Two times a day (BID) | ORAL | Status: DC

## 2014-10-28 MED ORDER — MELOXICAM 15 MG PO TABS: 15.0000 mg | ORAL_TABLET | Freq: Every day | ORAL | Status: DC

## 2014-10-28 MED ORDER — IPRATROPIUM BROMIDE 0.06 % NA SOLN: 2.0000 | Freq: Four times a day (QID) | NASAL | Status: DC

## 2014-10-28 NOTE — Discharge Instructions (Signed)
You have strep throat. Drink plenty of fluids. Use the atrovent nasal spray to help with nasal congestion. Use Mucinex, Delsym or honey for the cough. Take amoxicillin 1 pill twice a day for 10 days.

## 2014-10-28 NOTE — ED Notes (Signed)
33 year old female with fever cough  Congestion and headache body aches for the past 3 days

## 2014-10-28 NOTE — ED Provider Notes (Signed)
CSN: [id number]     Arrival date & time 10/28/14  1934 History   First MD Initiated Contact with Patient 10/28/14 1939     Chief Complaint  Patient presents with  . Fever   (Consider location/radiation/quality/duration/timing/severity/associated sxs/prior Treatment) HPI  She is a 33 year old woman here with 3 of her children for evaluation of fever and cough. Symptoms started 2 days ago with fever, headache, nasal congestion, rhinorrhea, sore throat, cough, nausea, body aches. She denies any vomiting or diarrhea. She has been taking TheraFlu with minimal improvement. She has also been using Mucinex with minimal improvement. She reports decreased appetite, but is tolerating fluids well.  She also complains of soreness in the left lower back, described as a sharp pain. This been going on for several weeks. She's been taking ibuprofen 800mg  3-4 times a day with minimal improvement.  Past Medical History  Diagnosis Date  . Migraine   . Blood transfusion without reported diagnosis   . GERD (gastroesophageal reflux disease)     takes Prilosec as needed  . Cholelithiasis   . Constipation, chronic   . 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 Migraine headache   . Chronic insomnia    Past Surgical History  Procedure Laterality Date  . Renal artery stent    . Abdominal hysterectomy  08/27/2010  . Cholecystectomy  01/11/2013    Procedure: LAPAROSCOPIC CHOLECYSTECTOMY;  Surgeon: Shelly Rubensteinouglas A Blackman, MD;  Location: [facility];  Service: General;  Laterality: N/A;  Laparoscopic cholecystectomy  . Eye surgery Left     at 6mos old   Family History  Problem Relation Age of Onset  . Cancer Maternal Grandmother   . Cirrhosis Maternal Grandfather    History  Substance Use Topics  . Smoking status: Never Smoker   . Smokeless tobacco: Never Used  . Alcohol Use: No   OB History    No data available     Review of Systems  Constitutional: Positive for fever and  appetite change.  HENT: Positive for congestion, rhinorrhea and sore throat.   Respiratory: Positive for cough. Negative for shortness of breath.   Gastrointestinal: Positive for nausea. Negative for vomiting and diarrhea.  Musculoskeletal: Positive for myalgias.  Neurological: Positive for headaches.    Allergies  Doxycycline; Hydrocodone; Macrolides and ketolides; and Trazodone and nefazodone  Home Medications   Prior to Admission medications   Medication Sig Start Date End Date Taking? Authorizing Provider  amoxicillin (AMOXIL) 500 MG capsule Take 1 capsule (500 mg total) by mouth 2 (two) times daily. 10/28/14   Charm RingsErin J Jalexis Breed, MD  aspirin EC 81 MG tablet Take 1 tablet (81 mg total) by mouth daily. 08/15/14   Derwood KaplanAnkit Nanavati, MD  cyclobenzaprine (FLEXERIL) 10 MG tablet Take 1 tablet (10 mg total) by mouth at bedtime. 09/27/14   Babs SciaraScott A Luking, MD  ibuprofen (ADVIL,MOTRIN) 200 MG tablet Take 600-800 mg by mouth every 6 (six) hours as needed (for pain).    Historical Provider, MD  ipratropium (ATROVENT) 0.06 % nasal spray Place 2 sprays into both nostrils 4 (four) times daily. 10/28/14   Charm RingsErin J Avilene Marrin, MD  meloxicam (MOBIC) 15 MG tablet Take 1 tablet (15 mg total) by mouth daily. 10/28/14   Charm RingsErin J Eloyce Bultman, MD  naproxen (NAPROSYN) 500 MG tablet Take 1 tablet (500 mg total) by mouth 2 (two) times daily with a meal. 09/27/14   Babs SciaraScott A Luking, MD  oxyCODONE-acetaminophen (PERCOCET) 10-325 MG  per tablet Take 1 tablet by mouth 3 (three) times daily as needed for pain. Do not exceed 3 tablets in a day 10/04/14   Babs SciaraScott A Luking, MD   BP 114/76 mmHg  Pulse 63  Temp(Src) 98.3 F (36.8 C) (Oral)  SpO2 100% Physical Exam  Constitutional: She is oriented to person, place, and time. She appears well-developed and well-nourished. No distress.  HENT:  Head: Normocephalic and atraumatic.  Nose: Mucosal edema and rhinorrhea present.  Mouth/Throat: Oropharynx is clear and moist. No oropharyngeal exudate.   Neck: Neck supple.  Cardiovascular: Normal rate, regular rhythm and normal heart sounds.   No murmur heard. Pulmonary/Chest: Effort normal and breath sounds normal. No respiratory distress. She has no wheezes. She has no rales.  Abdominal: Soft. There is no tenderness.  Musculoskeletal:  Back: tender to palpation of left superior buttock  Lymphadenopathy:    She has no cervical adenopathy.  Neurological: She is alert and oriented to person, place, and time.    ED Course  Procedures (including critical care time) Labs Review Labs Reviewed - No data to display  Imaging Review No results found.   MDM   1. Strep pharyngitis    Daughter tested positive for strep throat. Amoxicillin for 10 days. Symptomatically treatment with Atrovent, Delsym, honey. Also provided meloxicam to use for her back pain. Discussed not taking this with ibuprofen, Advil, Aleve. Follow-up with PCP as needed.    Charm RingsErin J Eyonna Sandstrom, MD 10/28/14 [phone]

## 2014-10-31 ENCOUNTER — Telehealth: Payer: Self-pay | Admitting: Family Medicine

## 2014-10-31 ENCOUNTER — Telehealth: Payer: Self-pay | Admitting: *Deleted

## 2014-10-31 MED ORDER — OXYCODONE-ACETAMINOPHEN 10-325 MG PO TABS: 1.0000 | ORAL_TABLET | Freq: Three times a day (TID) | ORAL | Status: DC | PRN

## 2014-10-31 NOTE — Telephone Encounter (Signed)
sshe may have a prescription for 60 tablets, one 3 times a day when necessary, try to make these last for a month, also after discussing with the patient and giving her this prescription [facility] thank you

## 2014-10-31 NOTE — Telephone Encounter (Signed)
FYI:Pt has appointment with new pain management °Appt 11/15/14 @ 9:30am with Janice Orsini @ [facility] in [place] °See referral under referrals tab in chart review for address, phone & fax numbers ° °

## 2014-10-31 NOTE — Telephone Encounter (Signed)
Rx up front for patient pick up. Patient advised about her upcoming appt with pain management and advised to make sure she keeps this appt. Patient verbalized understanding.

## 2014-10-31 NOTE — Telephone Encounter (Signed)
Patient said that no one has contacted her regarding the referral for her pain management yet.  She said she has been out of pain medication since 10/26/14.  She wants to know if we can give her another refill of oxyCODONE-acetaminophen (PERCOCET) 10-325 MG per tablet.

## 2014-12-02 ENCOUNTER — Other Ambulatory Visit: Payer: Self-pay | Admitting: Family Medicine

## 2014-12-21 ENCOUNTER — Encounter (HOSPITAL_COMMUNITY): Payer: Self-pay | Admitting: Emergency Medicine

## 2014-12-21 ENCOUNTER — Emergency Department (HOSPITAL_COMMUNITY)
Admission: EM | Admit: 2014-12-21 | Discharge: 2014-12-21 | Disposition: A | Discharge status: Home / Self Care | Payer: Medicaid Other | Attending: Emergency Medicine | Admitting: Emergency Medicine

## 2014-12-21 DIAGNOSIS — G43909 Migraine, unspecified, not intractable, without status migrainosus: Secondary | ICD-10-CM | POA: Diagnosis not present

## 2014-12-21 DIAGNOSIS — N12 Tubulo-interstitial nephritis, not specified as acute or chronic: Secondary | ICD-10-CM | POA: Insufficient documentation

## 2014-12-21 DIAGNOSIS — R1030 Lower abdominal pain, unspecified: Secondary | ICD-10-CM | POA: Diagnosis present

## 2014-12-21 DIAGNOSIS — Z791 Long term (current) use of non-steroidal anti-inflammatories (NSAID): Secondary | ICD-10-CM | POA: Insufficient documentation

## 2014-12-21 DIAGNOSIS — R110 Nausea: Secondary | ICD-10-CM | POA: Insufficient documentation

## 2014-12-21 DIAGNOSIS — Z79899 Other long term (current) drug therapy: Secondary | ICD-10-CM | POA: Insufficient documentation

## 2014-12-21 DIAGNOSIS — Z792 Long term (current) use of antibiotics: Secondary | ICD-10-CM | POA: Diagnosis not present

## 2014-12-21 DIAGNOSIS — Z7982 Long term (current) use of aspirin: Secondary | ICD-10-CM | POA: Insufficient documentation

## 2014-12-21 DIAGNOSIS — K219 Gastro-esophageal reflux disease without esophagitis: Secondary | ICD-10-CM | POA: Insufficient documentation

## 2014-12-21 DIAGNOSIS — Z3202 Encounter for pregnancy test, result negative: Secondary | ICD-10-CM | POA: Diagnosis not present

## 2014-12-21 LAB — URINALYSIS, ROUTINE W REFLEX MICROSCOPIC
Glucose, UA: NEGATIVE mg/dL
HGB URINE DIPSTICK: NEGATIVE
KETONES UR: 15 mg/dL — AB
NITRITE: NEGATIVE
Protein, ur: NEGATIVE mg/dL
Specific Gravity, Urine: 1.017 (ref 1.005–1.030)
Urobilinogen, UA: 0.2 mg/dL (ref 0.0–1.0)
pH: 5.5 (ref 5.0–8.0)

## 2014-12-21 LAB — URINE MICROSCOPIC-ADD ON

## 2014-12-21 LAB — POC URINE PREG, ED: PREG TEST UR: NEGATIVE

## 2014-12-21 MED ORDER — CEPHALEXIN 500 MG PO CAPS: 500.0000 mg | ORAL_CAPSULE | Freq: Three times a day (TID) | ORAL | Status: DC

## 2014-12-21 NOTE — ED Provider Notes (Signed)
CSN: [id number]     Arrival date & time 12/21/14  2122 History   First MD Initiated Contact with Patient 12/21/14 2135     Chief Complaint  Patient presents with  . Flank Pain    The patient said she has had stents placed in one of her kidneys but she doesn't remember which one.     (Consider location/radiation/quality/duration/timing/severity/associated sxs/prior Treatment) Patient is a 34 y.o. female presenting with dysuria.  Dysuria Pain quality:  Aching Pain severity:  Severe Onset quality:  Gradual Duration:  5 days Timing:  Constant Progression:  Worsening Chronicity:  Recurrent Relieved by:  Nothing Exacerbated by: Urination. Urinary symptoms: discolored urine, foul-smelling urine and frequent urination   Associated symptoms: abdominal pain (Suprapubic and left flank) and nausea   Associated symptoms: no fever (Does complain of sweats and chills), no vaginal discharge and no vomiting     Past Medical History  Diagnosis Date  . Migraine   . Blood transfusion without reported diagnosis   . GERD (gastroesophageal reflux disease)     takes Prilosec as needed  . Cholelithiasis   . Constipation, chronic   . 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 Migraine headache   . Chronic insomnia    Past Surgical History  Procedure Laterality Date  . Renal artery stent    . Abdominal hysterectomy  08/27/2010  . Cholecystectomy  01/11/2013    Procedure: LAPAROSCOPIC CHOLECYSTECTOMY;  Surgeon: Shelly Rubenstein, MD;  Location: [facility];  Service: General;  Laterality: N/A;  Laparoscopic cholecystectomy  . Eye surgery Left     at 6mos old   Family History  Problem Relation Age of Onset  . Cancer Maternal Grandmother   . Cirrhosis Maternal Grandfather    History  Substance Use Topics  . Smoking status: Never Smoker   . Smokeless tobacco: Never Used  . Alcohol Use: No   OB History    No data available     Review of Systems  Constitutional:  Negative for fever (Does complain of sweats and chills).  Gastrointestinal: Positive for nausea and abdominal pain (Suprapubic and left flank). Negative for vomiting.  Genitourinary: Positive for dysuria. Negative for vaginal discharge.  All other systems reviewed and are negative.     Allergies  Doxycycline; Hydrocodone; Macrolides and ketolides; and Trazodone and nefazodone  Home Medications   Prior to Admission medications   Medication Sig Start Date End Date Taking? Authorizing Provider  amoxicillin (AMOXIL) 500 MG capsule Take 1 capsule (500 mg total) by mouth 2 (two) times daily. 10/28/14   [name], MD  aspirin EC 81 MG tablet Take 1 tablet (81 mg total) by mouth daily. 08/15/14   Derwood Kaplan, MD  cyclobenzaprine (FLEXERIL) 10 MG tablet Take 1 tablet (10 mg total) by mouth at bedtime. 09/27/14   [name], MD  ibuprofen (ADVIL,MOTRIN) 200 MG tablet Take 600-800 mg by mouth every 6 (six) hours as needed (for pain).    Historical Provider, MD  ipratropium (ATROVENT) 0.06 % nasal spray Place 2 sprays into both nostrils 4 (four) times daily. 10/28/14   [name], MD  meloxicam (MOBIC) 15 MG tablet Take 1 tablet (15 mg total) by mouth daily. 10/28/14   [name], MD  naproxen (NAPROSYN) 500 MG tablet Take 1 tablet (500 mg total) by mouth 2 (two) times daily with a meal. 09/27/14   [name], MD  oxyCODONE-acetaminophen (PERCOCET)  10-325 MG per tablet Take 1 tablet by mouth 3 (three) times daily as needed for pain. Do not exceed 3 tablets in a day 10/31/14   Babs SciaraScott A Luking, MD   There were no vitals taken for this visit. Physical Exam  Constitutional: She is oriented to person, place, and time. She appears well-developed and well-nourished. No distress.  HENT:  Head: Normocephalic and atraumatic.  Mouth/Throat: Oropharynx is clear and moist.  Eyes: Conjunctivae are normal. Pupils are equal, round, and reactive to light. No scleral icterus.  Neck: Neck supple.   Cardiovascular: Normal rate, regular rhythm, normal heart sounds and intact distal pulses.   No murmur heard. Pulmonary/Chest: Effort normal and breath sounds normal. No stridor. No respiratory distress. She has no rales.  Abdominal: Soft. Bowel sounds are normal. She exhibits no distension. There is tenderness in the suprapubic area. There is CVA tenderness (Mild left). There is no rigidity, no rebound and no guarding.  Musculoskeletal: Normal range of motion.  Neurological: She is alert and oriented to person, place, and time.  Skin: Skin is warm and dry. No rash noted.  Psychiatric: She has a normal mood and affect. Her behavior is normal.  Nursing note and vitals reviewed.   ED Course  Procedures (including critical care time) Labs Review Labs Reviewed  URINALYSIS, ROUTINE W REFLEX MICROSCOPIC - Abnormal; Notable for the following:    APPearance CLOUDY (*)    Bilirubin Urine SMALL (*)    Ketones, ur 15 (*)    Leukocytes, UA MODERATE (*)    All other components within normal limits  URINE MICROSCOPIC-ADD ON - Abnormal; Notable for the following:    Squamous Epithelial / LPF MANY (*)    Bacteria, UA MANY (*)    All other components within normal limits  URINE CULTURE  POC URINE PREG, ED    Imaging Review No results found.   EKG Interpretation None      MDM   Final diagnoses:  Pyelonephritis    34 year old female with dysuria, suprapubic abdominal pain, and left-sided flank pain. She thinks she has a urinary tract infection. Her urinalysis supports this. She is nontoxic and nonseptic appearing. Plan discharge home with Keflex and follow-up. Will treat as pyelonephritis as she has left-sided flank pain. Given return precautions.    Candyce ChurnJohn David Ravan Schlemmer III, MD 12/21/14 [phone]

## 2014-12-21 NOTE — Discharge Instructions (Signed)
Pyelonephritis, Adult °Pyelonephritis is a kidney infection. In general, there are 2 main types of pyelonephritis: °· Infections that come on quickly without any warning (acute pyelonephritis). °· Infections that persist for a long period of time (chronic pyelonephritis). °CAUSES  °Two main causes of pyelonephritis are: °· Bacteria traveling from the bladder to the kidney. This is a problem especially in pregnant women. The urine in the bladder can become filled with bacteria from multiple causes, including: °¨ Inflammation of the prostate gland (prostatitis). °¨ Sexual intercourse in females. °¨ Bladder infection (cystitis). °· Bacteria traveling from the bloodstream to the tissue part of the kidney. °Problems that may increase your risk of getting a kidney infection include: °· Diabetes. °· Kidney stones or bladder stones. °· Cancer. °· Catheters placed in the bladder. °· Other abnormalities of the kidney or ureter. °SYMPTOMS  °· Abdominal pain. °· Pain in the side or flank area. °· Fever. °· Chills. °· Upset stomach. °· Blood in the urine (dark urine). °· Frequent urination. °· Strong or persistent urge to urinate. °· Burning or stinging when urinating. °DIAGNOSIS  °Your caregiver may diagnose your kidney infection based on your symptoms. A urine sample may also be taken. °TREATMENT  °In general, treatment depends on how severe the infection is.  °· If the infection is mild and caught early, your caregiver may treat you with oral antibiotics and send you home. °· If the infection is more severe, the bacteria may have gotten into the bloodstream. This will require intravenous (IV) antibiotics and a hospital stay. Symptoms may include: °¨ High fever. °¨ Severe flank pain. °¨ Shaking chills. °· Even after a hospital stay, your caregiver may require you to be on oral antibiotics for a period of time. °· Other treatments may be required depending upon the cause of the infection. °HOME CARE INSTRUCTIONS  °· Take your  antibiotics as directed. Finish them even if you start to feel better. °· Make an appointment to have your urine checked to make sure the infection is gone. °· Drink enough fluids to keep your urine clear or pale yellow. °· Take medicines for the bladder if you have urgency and frequency of urination as directed by your caregiver. °SEEK IMMEDIATE MEDICAL CARE IF:  °· You have a fever or persistent symptoms for more than 2-3 days. °· You have a fever and your symptoms suddenly get worse. °· You are unable to take your antibiotics or fluids. °· You develop shaking chills. °· You experience extreme weakness or fainting. °· There is no improvement after 2 days of treatment. °MAKE SURE YOU: °· Understand these instructions. °· Will watch your condition. °· Will get help right away if you are not doing well or get worse. °Document Released: 12/02/2005 Document Revised: 06/02/2012 Document Reviewed: 05/08/2011 °ExitCare® Patient Information ©2015 [employer]. This information is not intended to replace advice given to you by your health care provider. Make sure you discuss any questions you have with your health care provider. ° °

## 2014-12-21 NOTE — ED Notes (Signed)
The patient said she has had stents placed in one of her kidneys but she doesn't remember which one.  She had a blockage when she was 25five years old and she had the stent placed.  Today she presents with cold chills, dysuria, pressure and cloudy urine.  She says her urine smells bad and it is dark.  She denies any blood.

## 2014-12-23 LAB — URINE CULTURE: Colony Count: >=100000

## 2014-12-27 ENCOUNTER — Telehealth (HOSPITAL_COMMUNITY): Payer: Self-pay

## 2014-12-27 NOTE — Telephone Encounter (Signed)
Post ED Visit - Positive Culture Follow-up  Culture report reviewed by antimicrobial stewardship pharmacist: [] Wes Dulaney, Pharm.D., BCPS [x] Jeremy Frens, Pharm.D., BCPS [] Elizabeth Martin, Pharm.D., BCPS [] Minh Pham, Pharm.D., BCPS, AAHIVP [] Michelle Turner, Pharm.D., BCPS, AAHIVP [] Lorie Poole, Pharm.D., BCPS  Positive Urine culture, 100,000 colonies -> Ecoli Treated with Cephalexin, organism sensitive to the same and no further patient follow-up is required at this time.  Heather Moon 12/27/2014, 5:02 AM 

## 2015-01-16 DIAGNOSIS — Z029 Encounter for administrative examinations, unspecified: Secondary | ICD-10-CM

## 2015-01-20 ENCOUNTER — Telehealth: Payer: Self-pay | Admitting: Family Medicine

## 2015-01-20 ENCOUNTER — Other Ambulatory Visit: Payer: Self-pay | Admitting: *Deleted

## 2015-01-20 MED ORDER — ZOLPIDEM TARTRATE 5 MG PO TABS: 5.0000 mg | ORAL_TABLET | Freq: Every evening | ORAL | Status: DC | PRN

## 2015-01-20 NOTE — Telephone Encounter (Signed)
Ambien 5 mg one qhs prn sleep.#30,1 refill, must devote 8 hours to sleep with this medicine

## 2015-01-20 NOTE — Telephone Encounter (Signed)
Last seen 09/27/14.   I do not see Ambien on her current med list.

## 2015-01-20 NOTE — Telephone Encounter (Signed)
Pt calling again to check on this

## 2015-01-20 NOTE — Telephone Encounter (Signed)
Pt is requesting a refill on her ambien. Pt stated the pharmacy told her  That if she called us the refill would get called in quicker than if they faxed it to us.  [facility]

## 2015-01-20 NOTE — Telephone Encounter (Signed)
Med faxed to pharm. Pt notified.  

## 2015-02-08 ENCOUNTER — Emergency Department (HOSPITAL_COMMUNITY): Payer: Medicaid Other

## 2015-02-08 ENCOUNTER — Emergency Department (HOSPITAL_COMMUNITY)
Admission: EM | Admit: 2015-02-08 | Discharge: 2015-02-09 | Disposition: A | Discharge status: Home / Self Care | Payer: Medicaid Other | Attending: Emergency Medicine | Admitting: Emergency Medicine

## 2015-02-08 ENCOUNTER — Encounter (HOSPITAL_COMMUNITY): Payer: Self-pay | Admitting: *Deleted

## 2015-02-08 DIAGNOSIS — Z792 Long term (current) use of antibiotics: Secondary | ICD-10-CM | POA: Diagnosis not present

## 2015-02-08 DIAGNOSIS — Z79899 Other long term (current) drug therapy: Secondary | ICD-10-CM | POA: Diagnosis not present

## 2015-02-08 DIAGNOSIS — Z791 Long term (current) use of non-steroidal anti-inflammatories (NSAID): Secondary | ICD-10-CM | POA: Insufficient documentation

## 2015-02-08 DIAGNOSIS — G4700 Insomnia, unspecified: Secondary | ICD-10-CM | POA: Insufficient documentation

## 2015-02-08 DIAGNOSIS — K219 Gastro-esophageal reflux disease without esophagitis: Secondary | ICD-10-CM | POA: Insufficient documentation

## 2015-02-08 DIAGNOSIS — Z7982 Long term (current) use of aspirin: Secondary | ICD-10-CM | POA: Insufficient documentation

## 2015-02-08 DIAGNOSIS — G43909 Migraine, unspecified, not intractable, without status migrainosus: Secondary | ICD-10-CM | POA: Diagnosis not present

## 2015-02-08 DIAGNOSIS — N12 Tubulo-interstitial nephritis, not specified as acute or chronic: Secondary | ICD-10-CM | POA: Diagnosis not present

## 2015-02-08 DIAGNOSIS — R109 Unspecified abdominal pain: Secondary | ICD-10-CM | POA: Diagnosis present

## 2015-02-08 LAB — CBC WITH DIFFERENTIAL/PLATELET
Basophils Absolute: 0 10*3/uL (ref 0.0–0.1)
Basophils Relative: 0 % (ref 0–1)
Eosinophils Absolute: 0.1 10*3/uL (ref 0.0–0.7)
Eosinophils Relative: 1 % (ref 0–5)
HCT: 37.8 % (ref 36.0–46.0)
Hemoglobin: 12.5 g/dL (ref 12.0–15.0)
LYMPHS ABS: 1.6 10*3/uL (ref 0.7–4.0)
Lymphocytes Relative: 25 % (ref 12–46)
MCH: 31.3 pg (ref 26.0–34.0)
MCHC: 33.1 g/dL (ref 30.0–36.0)
MCV: 94.5 fL (ref 78.0–100.0)
Monocytes Absolute: 0.4 10*3/uL (ref 0.1–1.0)
Monocytes Relative: 5 % (ref 3–12)
NEUTROS ABS: 4.4 10*3/uL (ref 1.7–7.7)
NEUTROS PCT: 69 % (ref 43–77)
Platelets: 186 10*3/uL (ref 150–400)
RBC: 4 MIL/uL (ref 3.87–5.11)
RDW: 12.8 % (ref 11.5–15.5)
WBC: 6.4 10*3/uL (ref 4.0–10.5)

## 2015-02-08 LAB — URINALYSIS, ROUTINE W REFLEX MICROSCOPIC
Glucose, UA: NEGATIVE mg/dL
Hgb urine dipstick: NEGATIVE
Ketones, ur: NEGATIVE mg/dL
Nitrite: NEGATIVE
PROTEIN: 30 mg/dL — AB
SPECIFIC GRAVITY, URINE: 1.018 (ref 1.005–1.030)
UROBILINOGEN UA: 1 mg/dL (ref 0.0–1.0)
pH: 5.5 (ref 5.0–8.0)

## 2015-02-08 LAB — URINE MICROSCOPIC-ADD ON

## 2015-02-08 LAB — BASIC METABOLIC PANEL
Anion gap: 7 (ref 5–15)
BUN: 5 mg/dL — ABNORMAL LOW (ref 6–23)
CO2: 27 mmol/L (ref 19–32)
Calcium: 9 mg/dL (ref 8.4–10.5)
Chloride: 104 mmol/L (ref 96–112)
Creatinine, Ser: 1.29 mg/dL — ABNORMAL HIGH (ref 0.50–1.10)
GFR calc Af Amer: 61 mL/min — ABNORMAL LOW (ref >90)
GFR calc non Af Amer: 53 mL/min — ABNORMAL LOW (ref >90)
GLUCOSE: 89 mg/dL (ref 70–99)
Potassium: 4.3 mmol/L (ref 3.5–5.1)
SODIUM: 138 mmol/L (ref 135–145)

## 2015-02-08 MED ORDER — SODIUM CHLORIDE 0.9 % IV BOLUS (SEPSIS)
1000.0000 mL | Freq: Once | INTRAVENOUS | Status: AC
  Administered 2015-02-08: 1000 mL via INTRAVENOUS

## 2015-02-08 MED ORDER — HYDROMORPHONE HCL 1 MG/ML IJ SOLN
1.0000 mg | Freq: Once | INTRAMUSCULAR | Status: AC
  Administered 2015-02-08: 1 mg via INTRAVENOUS
  Filled 2015-02-08: qty 1

## 2015-02-08 MED ORDER — CIPROFLOXACIN HCL 500 MG PO TABS
500.0000 mg | ORAL_TABLET | Freq: Once | ORAL | Status: AC
  Administered 2015-02-08: 500 mg via ORAL
  Filled 2015-02-08: qty 1

## 2015-02-08 MED ORDER — DEXTROSE 5 % IV SOLN
1.0000 g | Freq: Once | INTRAVENOUS | Status: AC
  Administered 2015-02-08: 1 g via INTRAVENOUS
  Filled 2015-02-08: qty 10

## 2015-02-08 MED ORDER — CIPROFLOXACIN HCL 500 MG PO TABS: 500.0000 mg | ORAL_TABLET | Freq: Two times a day (BID) | ORAL | Status: DC

## 2015-02-08 MED ORDER — ONDANSETRON HCL 4 MG/2ML IJ SOLN
4.0000 mg | Freq: Once | INTRAMUSCULAR | Status: AC
  Administered 2015-02-08: 4 mg via INTRAVENOUS
  Filled 2015-02-08: qty 2

## 2015-02-08 NOTE — ED Provider Notes (Signed)
CSN: [id number]     Arrival date & time 02/08/15  1939 History   First MD Initiated Contact with Patient 02/08/15 2123     Chief Complaint  Patient presents with  . Flank Pain     (Consider location/radiation/quality/duration/timing/severity/associated sxs/prior Treatment) HPI  Pt presenting with c/o left flank pain x 3 days.  She states she feels pressure with urination and some pain.  She has also had nausea, no vomiting.  No fever/chills.  Pain is constant.  Pt is s/p hysterectomy.  She was treated several weeks ago for pyelonephritis with keflex.  She states she felt better for a couple weeks afterwards until symptoms started gradually 3 days ago.  No change in stools.  She has been taking percocet 10 which she receives from pain management clinic but states this is not helping her pain.  There are no other associated systemic symptoms, there are no other alleviating or modifying factors.   Past Medical History  Diagnosis Date  . Migraine   . Blood transfusion without reported diagnosis   . GERD (gastroesophageal reflux disease)     takes Prilosec as needed  . Cholelithiasis   . Constipation, chronic   . 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 Migraine headache   . Chronic insomnia    Past Surgical History  Procedure Laterality Date  . Renal artery stent    . Abdominal hysterectomy  08/27/2010  . Cholecystectomy  01/11/2013    Procedure: LAPAROSCOPIC CHOLECYSTECTOMY;  Surgeon: Shelly Rubensteinouglas A Blackman, MD;  Location: [facility];  Service: General;  Laterality: N/A;  Laparoscopic cholecystectomy  . Eye surgery Left     at 6mos old   Family History  Problem Relation Age of Onset  . Cancer Maternal Grandmother   . Cirrhosis Maternal Grandfather    History  Substance Use Topics  . Smoking status: Never Smoker   . Smokeless tobacco: Never Used  . Alcohol Use: No   OB History    No data available     Review of Systems  ROS reviewed and all otherwise  negative except for mentioned in HPI    Allergies  Doxycycline; Hydrocodone; Macrolides and ketolides; and Trazodone and nefazodone  Home Medications   Prior to Admission medications   Medication Sig Start Date End Date Taking? Authorizing Provider  aspirin EC 81 MG tablet Take 1 tablet (81 mg total) by mouth daily. 08/15/14  Yes Derwood KaplanAnkit Nanavati, MD  cyclobenzaprine (FLEXERIL) 10 MG tablet Take 1 tablet (10 mg total) by mouth at bedtime. 09/27/14  Yes Babs SciaraScott A Luking, MD  ibuprofen (ADVIL,MOTRIN) 200 MG tablet Take 600-800 mg by mouth every 6 (six) hours as needed (for pain).   Yes Historical Provider, MD  ipratropium (ATROVENT) 0.06 % nasal spray Place 2 sprays into both nostrils 4 (four) times daily. 10/28/14  Yes Charm RingsErin J Honig, MD  meloxicam (MOBIC) 15 MG tablet Take 1 tablet (15 mg total) by mouth daily. 10/28/14  Yes Charm RingsErin J Honig, MD  naproxen (NAPROSYN) 500 MG tablet Take 1 tablet (500 mg total) by mouth 2 (two) times daily with a meal. 09/27/14  Yes Babs SciaraScott A Luking, MD  oxyCODONE-acetaminophen (PERCOCET) 10-325 MG per tablet Take 1 tablet by mouth 3 (three) times daily as needed for pain. Do not exceed 3 tablets in a day 10/31/14  Yes Babs SciaraScott A Luking, MD  zolpidem (AMBIEN) 5 MG tablet Take 1 tablet (5 mg total) by mouth at bedtime  as needed for sleep. Must devote 8 hrs to sleep 01/20/15  Yes [name], MD  amoxicillin (AMOXIL) 500 MG capsule Take 1 capsule (500 mg total) by mouth 2 (two) times daily. Patient not taking: Reported on 12/21/2014 10/28/14   [name], MD  ciprofloxacin (CIPRO) 500 MG tablet Take 1 tablet (500 mg total) by mouth 2 (two) times daily. 02/08/15   Ethelda Chick, MD   BP 107/68 mmHg  Pulse 62  Temp(Src) 98.2 F (36.8 C) (Oral)  Resp 20  Ht  (1.626 m)  Wt 125 lb (56.7 kg)  BMI 21.45 kg/m2  SpO2 97%  Vitals reviewed Physical Exam  Physical Examination: General appearance - alert, well appearing, and in no distress Mental status - alert, oriented to  person, place, and time Eyes -no conjunctival injection, no scleral icterus Mouth - mucous membranes moist, pharynx normal without lesions Chest - clear to auscultation, no wheezes, rales or rhonchi, symmetric air entry Heart - normal rate, regular rhythm, normal S1, S2, no murmurs, rubs, clicks or gallops Abdomen - soft, nontender, nondistended, no masses or organomegaly, nabs Back exam - no midline tenderness to palpation, left sided CVA tenderness Extremities - peripheral pulses normal, no pedal edema, no clubbing or cyanosis Skin - normal coloration and turgor, no rashes  ED Course  Procedures (including critical care time) Labs Review Labs Reviewed  BASIC METABOLIC PANEL - Abnormal; Notable for the following:    BUN 5 (*)    Creatinine, Ser 1.29 (*)    GFR calc non Af Amer 53 (*)    GFR calc Af Amer 61 (*)    All other components within normal limits  URINALYSIS, ROUTINE W REFLEX MICROSCOPIC - Abnormal; Notable for the following:    APPearance CLOUDY (*)    Bilirubin Urine SMALL (*)    Protein, ur 30 (*)    Leukocytes, UA MODERATE (*)    All other components within normal limits  URINE MICROSCOPIC-ADD ON - Abnormal; Notable for the following:    Squamous Epithelial / LPF MANY (*)    Bacteria, UA MANY (*)    All other components within normal limits  URINE CULTURE  CBC WITH DIFFERENTIAL/PLATELET    Imaging Review No results found.   EKG Interpretation None      MDM   Final diagnoses:  Pyelonephritis  Pt with likely pyelonephritis/UTI base on urine findings- she was treated with keflex approx one month ago- urine culture at that time shows sensitive to this.  Resistance to bactrim/levaquin/cipro- will give dose of rocephin in the ED and start on cipro to cover for cipro- pt not a candidate for macrobid due to Creatinine clearance < 60.  If culture is positive antibiotics will need to be adjusted accordingly.  Left ovarian cyst is resolved, some physiologic fluid.    Patient is overall nontoxic and well hydrated in appearance.  Pt receives pain meds from pain management.  Treated with pain meds in the ED and IV fluids.  Discharged with strict return precautions.  Pt agreeable with plan.       Ethelda Chick, MD 02/11/15 [phone]

## 2015-02-08 NOTE — ED Notes (Signed)
Patient presents with c/o left flank pain for 3 days  States everything makes her nauseated and difficulty with urination.

## 2015-02-08 NOTE — Discharge Instructions (Signed)
Return to the ED with any concerns including vomiting and not able to keep down liquids, fever/chills, fainting, worsening pain, decreased level of alertness/lethargy, or any other alarming symptoms

## 2015-02-11 LAB — URINE CULTURE: Colony Count: >=100000

## 2015-02-11 NOTE — Progress Notes (Signed)
ED Antimicrobial Stewardship Positive Culture Follow Up   Heather Moon is an 34 y.o. female who [facility] with a chief complaint of UTI sx Chief Complaint  Patient presents with  . Flank Pain    Recent Results (from the past 720 hour(s))  Urine culture     Status: None   Collection Time: 02/08/15  7:56 PM  Result Value Ref Range Status   Specimen Description URINE, RANDOM  Final   Special Requests ADDED [id number]  Final   Colony Count   Final    >=100,000 COLONIES/ML [facility]formed at [facility]    Culture   Final    ESCHERICHIA COLI [facility]formed at [facility]    Report Status 02/11/2015 FINAL  Final   Organism ID, Bacteria ESCHERICHIA COLI  Final      Susceptibility   Escherichia coli - MIC*    AMPICILLIN >=32 RESISTANT Resistant     CEFAZOLIN <=4 SENSITIVE Sensitive     CEFTRIAXONE <=1 SENSITIVE Sensitive     CIPROFLOXACIN >=4 RESISTANT Resistant     GENTAMICIN <=1 SENSITIVE Sensitive     LEVOFLOXACIN >=8 RESISTANT Resistant     NITROFURANTOIN <=16 SENSITIVE Sensitive     TOBRAMYCIN <=1 SENSITIVE Sensitive     TRIMETH/SULFA >=320 RESISTANT Resistant     PIP/TAZO <=4 SENSITIVE Sensitive     * ESCHERICHIA COLI    [x]  Treated with Cipro, organism resistant to prescribed antimicrobial  New antibiotic prescription: Keflex 500 mg bid x 10 days  ED Provider: Celene Skeenobyn Hess, PA-C  Rolley SimsMartin, Jannice Beitzel Ann 02/11/2015, 4:44 PM Infectious Diseases Pharmacist Phone# [phone]

## 2015-02-12 ENCOUNTER — Telehealth (HOSPITAL_BASED_OUTPATIENT_CLINIC_OR_DEPARTMENT_OTHER): Payer: Self-pay | Admitting: Emergency Medicine

## 2015-02-12 NOTE — Telephone Encounter (Addendum)
Post ED Visit - Positive Culture Follow-up: Successful Patient Follow-Up  Culture assessed and recommendations reviewed by: []  Wes Dulaney, Pharm.D., BCPS []  Celedonio MiyamotoJeremy Frens, Pharm.D., BCPS [x]  Georgina PillionElizabeth Martin, Pharm.D., BCPS []  StarksMinh Pham, [address]Pharm[address]D., BCPS, AAHIVP []  Estella HuskMichelle Turner, Pharm.D., BCPS, AAHIVP []  Red ChristiansSamson Lee, Pharm.D. []  Tennis Mustassie Stewart, [address]Pharm[address]  Positive Urine culture  []  Patient discharged without antimicrobial prescription and treatment is now indicated [x]  Organism is resistant to prescribed ED discharge antimicrobial []  Patient with positive blood cultures  Changes discussed with ED provider: Celene Skeenobyn Hess PA New antibiotic prescription Keflex 500 mg BID  x ten days [phone]  Contacted patient, date 02/12/15, time 1202  ID verified, pt notified of positive urine culture. instructed to stop Cipro and started Keflex. [facility]x [phone].  Jiles HaroldGammons, Elbony Mcclimans Chaney 02/12/2015, 12:06 PM

## 2015-03-23 DIAGNOSIS — Z029 Encounter for administrative examinations, unspecified: Secondary | ICD-10-CM

## 2015-04-07 ENCOUNTER — Telehealth: Payer: Self-pay | Admitting: Family Medicine

## 2015-04-07 MED ORDER — ZOLPIDEM TARTRATE 5 MG PO TABS: 5.0000 mg | ORAL_TABLET | Freq: Every evening | ORAL | Status: DC | PRN

## 2015-04-07 NOTE — Telephone Encounter (Signed)
May refill Ambien +2 refills

## 2015-04-07 NOTE — Telephone Encounter (Signed)
Rx faxed to pharmacy. Patient notified. 

## 2015-04-07 NOTE — Telephone Encounter (Signed)
Pt is needing a refill on her Remus Lofflerambien pt ran out yesterday.   [facility]

## 2015-04-17 ENCOUNTER — Encounter (HOSPITAL_COMMUNITY): Payer: Self-pay | Admitting: Emergency Medicine

## 2015-04-17 ENCOUNTER — Emergency Department (INDEPENDENT_AMBULATORY_CARE_PROVIDER_SITE_OTHER)
Admission: EM | Admit: 2015-04-17 | Discharge: 2015-04-17 | Disposition: A | Discharge status: Home / Self Care | Payer: Medicaid Other | Source: Home / Self Care

## 2015-04-17 DIAGNOSIS — J069 Acute upper respiratory infection, unspecified: Secondary | ICD-10-CM | POA: Diagnosis not present

## 2015-04-17 DIAGNOSIS — N3000 Acute cystitis without hematuria: Secondary | ICD-10-CM

## 2015-04-17 LAB — POCT URINALYSIS DIP (DEVICE)
BILIRUBIN URINE: NEGATIVE
Glucose, UA: NEGATIVE mg/dL
Hgb urine dipstick: NEGATIVE
Ketones, ur: NEGATIVE mg/dL
Nitrite: NEGATIVE
PH: 6.5 (ref 5.0–8.0)
PROTEIN: NEGATIVE mg/dL
Specific Gravity, Urine: 1.01 (ref 1.005–1.030)
UROBILINOGEN UA: 1 mg/dL (ref 0.0–1.0)

## 2015-04-17 MED ORDER — CEPHALEXIN 500 MG PO CAPS: 500.0000 mg | ORAL_CAPSULE | Freq: Two times a day (BID) | ORAL | Status: DC

## 2015-04-17 NOTE — Discharge Instructions (Signed)
Thank you for coming in today. Call or go to the emergency room if you get worse, have trouble breathing, have chest pains, or palpitations.   Take the keflex.  Return as needed.   Urinary Tract Infection Urinary tract infections (UTIs) can develop anywhere along your urinary tract. Your urinary tract is your body's drainage system for removing wastes and extra water. Your urinary tract includes two kidneys, two ureters, a bladder, and a urethra. Your kidneys are a pair of bean-shaped organs. Each kidney is about the size of your fist. They are located below your ribs, one on each side of your spine. CAUSES Infections are caused by microbes, which are microscopic organisms, including fungi, viruses, and bacteria. These organisms are so small that they can only be seen through a microscope. Bacteria are the microbes that most commonly cause UTIs. SYMPTOMS  Symptoms of UTIs may vary by age and gender of the patient and by the location of the infection. Symptoms in young women typically include a frequent and intense urge to urinate and a painful, burning feeling in the bladder or urethra during urination. Older women and men are more likely to be tired, shaky, and weak and have muscle aches and abdominal pain. A fever may mean the infection is in your kidneys. Other symptoms of a kidney infection include pain in your back or sides below the ribs, nausea, and vomiting. DIAGNOSIS To diagnose a UTI, your caregiver will ask you about your symptoms. Your caregiver also will ask to provide a urine sample. The urine sample will be tested for bacteria and white blood cells. White blood cells are made by your body to help fight infection. TREATMENT  Typically, UTIs can be treated with medication. Because most UTIs are caused by a bacterial infection, they usually can be treated with the use of antibiotics. The choice of antibiotic and length of treatment depend on your symptoms and the type of bacteria causing  your infection. HOME CARE INSTRUCTIONS  If you were prescribed antibiotics, take them exactly as your caregiver instructs you. Finish the medication even if you feel better after you have only taken some of the medication.  Drink enough water and fluids to keep your urine clear or pale yellow.  Avoid caffeine, tea, and carbonated beverages. They tend to irritate your bladder.  Empty your bladder often. Avoid holding urine for long periods of time.  Empty your bladder before and after sexual intercourse.  After a bowel movement, women should cleanse from front to back. Use each tissue only once. SEEK MEDICAL CARE IF:   You have back pain.  You develop a fever.  Your symptoms do not begin to resolve within 3 days. SEEK IMMEDIATE MEDICAL CARE IF:   You have severe back pain or lower abdominal pain.  You develop chills.  You have nausea or vomiting.  You have continued burning or discomfort with urination. MAKE SURE YOU:   Understand these instructions.  Will watch your condition.  Will get help right away if you are not doing well or get worse. Document Released: 09/11/2005 Document Revised: 06/02/2012 Document Reviewed: 01/10/2012 [facility] Patient Information 2015 [employer], [place]. This information is not intended to replace advice given to you by your health care provider. Make sure you discuss any questions you have with your health care provider.  Upper Respiratory Infection, Adult An upper respiratory infection (URI) is also sometimes known as the common cold. The upper respiratory tract includes the nose, sinuses, throat, trachea, and bronchi. Bronchi  are the airways leading to the lungs. Most people improve within 1 week, but symptoms can last up to 2 weeks. A residual cough may last even longer.  CAUSES Many different viruses can infect the tissues lining the upper respiratory tract. The tissues become irritated and inflamed and often become very moist. Mucus production  is also common. A cold is contagious. You can easily spread the virus to others by oral contact. This includes kissing, sharing a glass, coughing, or sneezing. Touching your mouth or nose and then touching a surface, which is then touched by another person, can also spread the virus. SYMPTOMS  Symptoms typically develop 1 to 3 days after you come in contact with a cold virus. Symptoms vary from person to person. They may include:  Runny nose.  Sneezing.  Nasal congestion.  Sinus irritation.  Sore throat.  Loss of voice (laryngitis).  Cough.  Fatigue.  Muscle aches.  Loss of appetite.  Headache.  Low-grade fever. DIAGNOSIS  You might diagnose your own cold based on familiar symptoms, since most people get a cold 2 to 3 times a year. Your caregiver can confirm this based on your exam. Most importantly, your caregiver can check that your symptoms are not due to another disease such as strep throat, sinusitis, pneumonia, asthma, or epiglottitis. Blood tests, throat tests, and X-rays are not necessary to diagnose a common cold, but they may sometimes be helpful in excluding other more serious diseases. Your caregiver will decide if any further tests are required. RISKS AND COMPLICATIONS  You may be at risk for a more severe case of the common cold if you smoke cigarettes, have chronic heart disease (such as heart failure) or lung disease (such as asthma), or if you have a weakened immune system. The very young and very old are also at risk for more serious infections. Bacterial sinusitis, middle ear infections, and bacterial pneumonia can complicate the common cold. The common cold can worsen asthma and chronic obstructive pulmonary disease (COPD). Sometimes, these complications can require emergency medical care and may be life-threatening. PREVENTION  The best way to protect against getting a cold is to practice good hygiene. Avoid oral or hand contact with people with cold symptoms.  Wash your hands often if contact occurs. There is no clear evidence that vitamin C, vitamin E, echinacea, or exercise reduces the chance of developing a cold. However, it is always recommended to get plenty of rest and practice good nutrition. TREATMENT  Treatment is directed at relieving symptoms. There is no cure. Antibiotics are not effective, because the infection is caused by a virus, not by bacteria. Treatment may include:  Increased fluid intake. Sports drinks offer valuable electrolytes, sugars, and fluids.  Breathing heated mist or steam (vaporizer or shower).  Eating chicken soup or other clear broths, and maintaining good nutrition.  Getting plenty of rest.  Using gargles or lozenges for comfort.  Controlling fevers with ibuprofen or acetaminophen as directed by your caregiver.  Increasing usage of your inhaler if you have asthma. Zinc gel and zinc lozenges, taken in the first 24 hours of the common cold, can shorten the duration and lessen the severity of symptoms. Pain medicines may help with fever, muscle aches, and throat pain. A variety of non-prescription medicines are available to treat congestion and runny nose. Your caregiver can make recommendations and may suggest nasal or lung inhalers for other symptoms.  HOME CARE INSTRUCTIONS   Only take over-the-counter or prescription medicines for pain, discomfort, or  fever as directed by your caregiver.  Use a warm mist humidifier or inhale steam from a shower to increase air moisture. This may keep secretions moist and make it easier to breathe.  Drink enough water and fluids to keep your urine clear or pale yellow.  Rest as needed.  Return to work when your temperature has returned to normal or as your caregiver advises. You may need to stay home longer to avoid infecting others. You can also use a face mask and careful hand washing to prevent spread of the virus. SEEK MEDICAL CARE IF:   After the first few days, you  feel you are getting worse rather than better.  You need your caregiver's advice about medicines to control symptoms.  You develop chills, worsening shortness of breath, or brown or red sputum. These may be signs of pneumonia.  You develop yellow or brown nasal discharge or pain in the face, especially when you bend forward. These may be signs of sinusitis.  You develop a fever, swollen neck glands, pain with swallowing, or white areas in the back of your throat. These may be signs of strep throat. SEEK IMMEDIATE MEDICAL CARE IF:   You have a fever.  You develop severe or persistent headache, ear pain, sinus pain, or chest pain.  You develop wheezing, a prolonged cough, cough up blood, or have a change in your usual mucus (if you have chronic lung disease).  You develop sore muscles or a stiff neck. Document Released: 05/28/2001 Document Revised: 02/24/2012 Document Reviewed: 03/09/2014 [facility] Patient Information 2015 [place], [place]. This information is not intended to replace advice given to you by your health care provider. Make sure you discuss any questions you have with your health care provider.

## 2015-04-17 NOTE — ED Notes (Signed)
Bed: UC01 Expected date:  Expected time:  Means of arrival:  Comments: Hold

## 2015-04-17 NOTE — ED Provider Notes (Addendum)
Heather Moon is a 34 y.o. female who presents to Urgent Care today for body aches cough fevers chills. Symptoms present for several days now. She has multiple family members with similar symptoms. She has not tried any treatment yet. No vomiting or diarrhea. She has urinary frequency urgency and dysuria. She's used AZO which helps temporarily.   Past Medical History  Diagnosis Date  . Migraine   . Blood transfusion without reported diagnosis   . GERD (gastroesophageal reflux disease)     takes Prilosec as needed  . Cholelithiasis   . Constipation, chronic   . 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 Migraine headache   . Chronic insomnia    Past Surgical History  Procedure Laterality Date  . Renal artery stent    . Abdominal hysterectomy  08/27/2010  . Cholecystectomy  01/11/2013    Procedure: LAPAROSCOPIC CHOLECYSTECTOMY;  Surgeon: Shelly Rubensteinouglas A Blackman, MD;  Location: [facility];  Service: General;  Laterality: N/A;  Laparoscopic cholecystectomy  . Eye surgery Left     at 6mos old   History  Substance Use Topics  . Smoking status: Never Smoker   . Smokeless tobacco: Never Used  . Alcohol Use: No   ROS as above Medications: No current facility-administered medications for this encounter.   Current Outpatient Prescriptions  Medication Sig Dispense Refill  . amoxicillin (AMOXIL) 500 MG capsule Take 1 capsule (500 mg total) by mouth 2 (two) times daily. (Patient not taking: Reported on 12/21/2014) 20 capsule 0  . aspirin EC 81 MG tablet Take 1 tablet (81 mg total) by mouth daily. 30 tablet 0  . ciprofloxacin (CIPRO) 500 MG tablet Take 1 tablet (500 mg total) by mouth 2 (two) times daily. 14 tablet 0  . cyclobenzaprine (FLEXERIL) 10 MG tablet Take 1 tablet (10 mg total) by mouth at bedtime. 30 tablet 2  . ibuprofen (ADVIL,MOTRIN) 200 MG tablet Take 600-800 mg by mouth every 6 (six) hours as needed (for pain).    Marland Kitchen. ipratropium (ATROVENT) 0.06 % nasal spray  Place 2 sprays into both nostrils 4 (four) times daily. 15 mL 0  . meloxicam (MOBIC) 15 MG tablet Take 1 tablet (15 mg total) by mouth daily. 30 tablet 0  . naproxen (NAPROSYN) 500 MG tablet Take 1 tablet (500 mg total) by mouth 2 (two) times daily with a meal. 30 tablet 2  . oxyCODONE-acetaminophen (PERCOCET) 10-325 MG per tablet Take 1 tablet by mouth 3 (three) times daily as needed for pain. Do not exceed 3 tablets in a day 60 tablet 0  . zolpidem (AMBIEN) 5 MG tablet Take 1 tablet (5 mg total) by mouth at bedtime as needed for sleep. Must devote 8 hrs to sleep 30 tablet 2   Allergies  Allergen Reactions  . Doxycycline Hives  . Hydrocodone Nausea And Vomiting  . Macrolides And Ketolides Nausea Only  . Trazodone And Nefazodone Other (See Comments)    Headaches     Exam:  BP 108/70 mmHg  Pulse 60  Temp(Src) 98.9 F (37.2 C) (Oral)  Resp 18  SpO2 98% Gen: Well NAD HEENT: EOMI,  MMM normal posterior pharynx and tympanic membranes Lungs: Normal work of breathing. CTABL Heart: RRR no MRG Abd: NABS, Soft. Nondistended, Nontender Exts: Brisk capillary refill, warm and well perfused.   Results for orders placed or performed during the hospital encounter of 04/17/15 (from the past 24 hour(s))  POCT urinalysis dip (device)  Status: Abnormal   Collection Time: 04/17/15  6:41 PM  Result Value Ref Range   Glucose, UA NEGATIVE NEGATIVE mg/dL   Bilirubin Urine NEGATIVE NEGATIVE   Ketones, ur NEGATIVE NEGATIVE mg/dL   Specific Gravity, Urine 1.010 1.005 - 1.030   Hgb urine dipstick NEGATIVE NEGATIVE   pH 6.5 5.0 - 8.0   Protein, ur NEGATIVE NEGATIVE mg/dL   Urobilinogen, UA 1.0 0.0 - 1.0 mg/dL   Nitrite NEGATIVE NEGATIVE   Leukocytes, UA SMALL (A) NEGATIVE   No results found.  Assessment and Plan: 34 y.o. female with viral URI. Symptomatically management with Tylenol or ibuprofen. Additionally patient likely has a urinary tract infection. Culture pending. Treat with Keflex.   Watchful waiting. Work note provided.  Discussed warning signs or symptoms. Please see discharge instructions. Patient expresses understanding.     Rodolph Bong, MD 04/17/15 1857  Rodolph Bong, MD 04/17/15 Ebony Cargo

## 2015-04-17 NOTE — ED Notes (Signed)
Pt states that she has had body aches and low back pain for a week

## 2015-04-20 LAB — URINE CULTURE
Colony Count: >=100000
SPECIAL REQUESTS: NORMAL

## 2015-04-20 NOTE — ED Notes (Signed)
Urine culture: >100,000 colonies E. Coli.  Pt. adequately treated with Keflex. Vassie MoselleYork, Jakeim Sedore M 04/20/2015

## 2015-05-18 ENCOUNTER — Ambulatory Visit: Payer: Medicaid Other | Admitting: Nurse Practitioner

## 2015-05-19 ENCOUNTER — Encounter: Payer: Medicaid Other | Admitting: Nurse Practitioner

## 2015-06-07 ENCOUNTER — Telehealth: Payer: Self-pay | Admitting: Family Medicine

## 2015-06-07 MED ORDER — ACYCLOVIR 400 MG PO TABS: 400.0000 mg | ORAL_TABLET | Freq: Four times a day (QID) | ORAL | Status: DC

## 2015-06-07 NOTE — Telephone Encounter (Signed)
Patient last seen 10/15 and Ambien decreased due to chronic narcotic use per chart.

## 2015-06-07 NOTE — Telephone Encounter (Signed)
#  1 acyclovir 400 mg 1 4 times a day for 5 days, 6 refills #2 the reason Ambien is now being done as 5 mg is the FDA has stated that for females that the 5 mg is the recommended dose and the second reason was any patient taking pain medication should not be on a higher dose anyways. So if she is no longer taking pain medicine we would still recommend 5 mg of Ambien. In some cases we have to add trazodone or something similar to it if necessary. So that is the reason why the 5 mg is recommended #30 with 5 refills is fine

## 2015-06-07 NOTE — Telephone Encounter (Signed)
Pt has a bad fever blister and is requesting a Rx for ACYCLOVIR, states we've given this to her before.    Pt also requesting that the dose on her Ambien be increased from 5mg  to 10mg .  States that she used to get 10mg  and is unsure why we decreased dose.  States she's taking 2 of the 5mg  at night and her 30 day Rx is only lasting 2 weeks.  Pt states it's hard for her to get here for an appointment due to taking her mom to all of her appts since being diagnosed with lung cancer  Please advise    [facility]

## 2015-06-07 NOTE — Telephone Encounter (Signed)
Discussed with patient. Patient advised #1 acyclovir 400 mg 1tab 4 times a day for 5 days, 6 refills #2 the reason Ambien is now being done as 5 mg is the FDA has stated that for females that the 5 mg is the recommended dose and the second reason was any patient taking pain medication should not be on a higher dose anyways. So if she is no longer taking pain medicine we would still recommend 5 mg of Ambien. Patient verbalized understanding. Rx sent electronically to pharmacy.

## 2015-06-18 ENCOUNTER — Emergency Department (HOSPITAL_COMMUNITY): Payer: Medicaid Other

## 2015-06-18 ENCOUNTER — Encounter (HOSPITAL_COMMUNITY): Payer: Self-pay | Admitting: Emergency Medicine

## 2015-06-18 ENCOUNTER — Emergency Department (HOSPITAL_COMMUNITY)
Admission: EM | Admit: 2015-06-18 | Discharge: 2015-06-18 | Disposition: A | Discharge status: Home / Self Care | Payer: Medicaid Other | Attending: Emergency Medicine | Admitting: Emergency Medicine

## 2015-06-18 DIAGNOSIS — G43909 Migraine, unspecified, not intractable, without status migrainosus: Secondary | ICD-10-CM | POA: Insufficient documentation

## 2015-06-18 DIAGNOSIS — Z792 Long term (current) use of antibiotics: Secondary | ICD-10-CM | POA: Diagnosis not present

## 2015-06-18 DIAGNOSIS — Z791 Long term (current) use of non-steroidal anti-inflammatories (NSAID): Secondary | ICD-10-CM | POA: Diagnosis not present

## 2015-06-18 DIAGNOSIS — G4700 Insomnia, unspecified: Secondary | ICD-10-CM | POA: Insufficient documentation

## 2015-06-18 DIAGNOSIS — K219 Gastro-esophageal reflux disease without esophagitis: Secondary | ICD-10-CM | POA: Insufficient documentation

## 2015-06-18 DIAGNOSIS — Z7982 Long term (current) use of aspirin: Secondary | ICD-10-CM | POA: Insufficient documentation

## 2015-06-18 DIAGNOSIS — Z87448 Personal history of other diseases of urinary system: Secondary | ICD-10-CM | POA: Insufficient documentation

## 2015-06-18 DIAGNOSIS — Z79899 Other long term (current) drug therapy: Secondary | ICD-10-CM | POA: Insufficient documentation

## 2015-06-18 DIAGNOSIS — M62838 Other muscle spasm: Secondary | ICD-10-CM | POA: Insufficient documentation

## 2015-06-18 DIAGNOSIS — M542 Cervicalgia: Secondary | ICD-10-CM | POA: Diagnosis present

## 2015-06-18 MED ORDER — IBUPROFEN 800 MG PO TABS: 800.0000 mg | ORAL_TABLET | Freq: Three times a day (TID) | ORAL | Status: DC

## 2015-06-18 MED ORDER — KETOROLAC TROMETHAMINE 60 MG/2ML IM SOLN
60.0000 mg | Freq: Once | INTRAMUSCULAR | Status: AC
  Administered 2015-06-18: 60 mg via INTRAMUSCULAR
  Filled 2015-06-18: qty 2

## 2015-06-18 MED ORDER — DIAZEPAM 5 MG/ML IJ SOLN
2.5000 mg | Freq: Once | INTRAMUSCULAR | Status: AC
  Administered 2015-06-18: 2.5 mg via INTRAVENOUS
  Filled 2015-06-18: qty 2

## 2015-06-18 MED ORDER — METHOCARBAMOL 500 MG PO TABS: 500.0000 mg | ORAL_TABLET | Freq: Two times a day (BID) | ORAL | Status: DC

## 2015-06-18 NOTE — ED Provider Notes (Signed)
CSN: [id number]     Arrival date & time 06/18/15  1918 History  This chart was scribed for non-physician practitioner, [name], PA-C, working with Tilden Fossa, MD, by Ronney Lion, ED Scribe. This patient was seen in room TR10C/TR10C and the patient's care was started at 9:36 PM.    Chief Complaint  Patient presents with  . Neck Pain   The history is provided by the patient. No language interpreter was used.    HPI Comments: Heather Moon is a 34 y.o. female with a PMHx of chronic neck pain, who presents to the Emergency Department complaining of neck pain/stiffness that began 2 days ago when she was working the yard and felt a pop followed by some increasing stiffness and pain in her left shoulder and neck with some associated tension headache. She has some stiffness with rotation of her neck to the left. Patient states she has been evaluated for several different doctors for her chronic neck pain over the years and prescribed various medications, including Percocet 10, Flexeril, Mobic, and Naprosyn, with no relief. She states she had seen someone at [facility] for this and told after reviewing imaging that it did not appear she needed surgery, although she disagrees.  She feels like something is now acutely wrong with her neck, although she does not suspect that she has broken her neck.  She does not have any numbness, tingling, weakness, vision changes, syncope.  She does not have a fever, sweats, chills.  Past Medical History  Diagnosis Date  . Migraine   . Blood transfusion without reported diagnosis   . GERD (gastroesophageal reflux disease)     takes Prilosec as needed  . Cholelithiasis   . Constipation, chronic   . 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 Migraine headache   . Chronic insomnia    Past Surgical History  Procedure Laterality Date  . Renal artery stent    . Abdominal hysterectomy  08/27/2010  . Cholecystectomy  01/11/2013    Procedure:  LAPAROSCOPIC CHOLECYSTECTOMY;  Surgeon: Shelly Rubenstein, MD;  Location: [facility];  Service: General;  Laterality: N/A;  Laparoscopic cholecystectomy  . Eye surgery Left     at 6mos old   Family History  Problem Relation Age of Onset  . Cancer Maternal Grandmother   . Cirrhosis Maternal Grandfather    History  Substance Use Topics  . Smoking status: Never Smoker   . Smokeless tobacco: Never Used  . Alcohol Use: No   OB History    No data available     Review of Systems  Musculoskeletal: Positive for neck pain.   Allergies  Doxycycline; Hydrocodone; Macrolides and ketolides; and Trazodone and nefazodone  Home Medications   Prior to Admission medications   Medication Sig Start Date End Date Taking? Authorizing Provider  acyclovir (ZOVIRAX) 400 MG tablet Take 1 tablet (400 mg total) by mouth 4 (four) times daily. For 5 day 06/07/15   [name], MD  aspirin EC 81 MG tablet Take 1 tablet (81 mg total) by mouth daily. 08/15/14   Derwood Kaplan, MD  cephALEXin (KEFLEX) 500 MG capsule Take 1 capsule (500 mg total) by mouth 2 (two) times daily. 04/17/15   Rodolph Bong, MD  ciprofloxacin (CIPRO) 500 MG tablet Take 1 tablet (500 mg total) by mouth 2 (two) times daily. 02/08/15   Jerelyn Scott, MD  cyclobenzaprine (FLEXERIL) 10 MG tablet Take 1 tablet (10  mg total) by mouth at bedtime. 09/27/14   [name], MD  ibuprofen (ADVIL,MOTRIN) 200 MG tablet Take 600-800 mg by mouth every 6 (six) hours as needed (for pain).    Historical Provider, MD  ipratropium (ATROVENT) 0.06 % nasal spray Place 2 sprays into both nostrils 4 (four) times daily. 10/28/14   [name], MD  meloxicam (MOBIC) 15 MG tablet Take 1 tablet (15 mg total) by mouth daily. 10/28/14   [name], MD  naproxen (NAPROSYN) 500 MG tablet Take 1 tablet (500 mg total) by mouth 2 (two) times daily with a meal. 09/27/14   [name], MD  oxyCODONE-acetaminophen (PERCOCET) 10-325 MG per tablet Take 1  tablet by mouth 3 (three) times daily as needed for pain. Do not exceed 3 tablets in a day 10/31/14   [name], MD  zolpidem (AMBIEN) 5 MG tablet Take 1 tablet (5 mg total) by mouth at bedtime as needed for sleep. Must devote 8 hrs to sleep 04/07/15   [name], MD   BP 107/62 mmHg  Pulse 75  Temp(Src) 98 F (36.7 C) (Oral)  Resp 16  Ht 5\' 4"  (1.626 m)  Wt 130 lb 1 oz (58.996 kg)  BMI 22.31 kg/m2  SpO2 100% Physical Exam  Constitutional: She is oriented to person, place, and time. She appears well-developed and well-nourished. No distress.  HENT:  Head: Normocephalic and atraumatic.  Right Ear: External ear normal.  Left Ear: External ear normal.  Nose: Nose normal.  Eyes: Conjunctivae and EOM are normal. Pupils are equal, round, and reactive to light. Right eye exhibits no discharge. Left eye exhibits no discharge. No scleral icterus.  Neck: Neck supple. No tracheal deviation present.  Cardiovascular: Normal rate and regular rhythm.  Exam reveals no gallop and no friction rub.   No murmur heard. Pulses:      Radial pulses are 2+ on the right side, and 2+ on the left side.  Pulmonary/Chest: Effort normal and breath sounds normal. No stridor. No respiratory distress. She has no wheezes.  Musculoskeletal:       Cervical back: She exhibits decreased range of motion and tenderness. She exhibits no bony tenderness, no swelling, no edema and no spasm.  normal flexion and extension of her neck, rotation to the left decreased due to pain, normal rotation to the right No cervical spine, thoracic spine or lumbar spinal tenderness to palpation Tenderness to palpation over cervical paraspinals, and to trapezius muscles, worse over the left shoulder and into the posterior neck   Neurological: She is alert and oriented to person, place, and time. She has normal strength. She is not disoriented. She displays no atrophy and no tremor. No cranial nerve deficit or sensory deficit. She  exhibits normal muscle tone. Coordination and gait normal.  Speech is clear and goal oriented, follows commands Major Cranial nerves without deficit, no facial droop Normal strength in upper and lower extremities bilaterally including dorsiflexion and plantar flexion, strong and equal grip strength Sensation normal to light and sharp touch Moves extremities without ataxia, coordination intact Normal gait and balance   Skin: Skin is warm, dry and intact. No rash noted. She is not diaphoretic. No cyanosis. No pallor. Nails show no clubbing.  Psychiatric: She has a normal mood and affect. Her behavior is normal.  Nursing note and vitals reviewed.   ED Course  Procedures (including critical care time)  DIAGNOSTIC STUDIES: Oxygen Saturation is 100% on RA, normal by my  interpretation.    COORDINATION OF CARE: 9:38 PM - Discussed XR results and treatment plan with pt at bedside which includes Rx pain relieving medication, and pt agreed to plan.  Imaging Review Dg Cervical Spine 2-3 Views  06/18/2015   CLINICAL DATA:  Pain at base of skull extending into the left arm for 2 days. The patient felt a pop in the left-sided her neck 2 days ago.  EXAM: CERVICAL SPINE - 2-3 VIEW  COMPARISON:  Cervical spine radiographs 02/18/2013  FINDINGS: The cervical spine is visualized from the skullbase through C7-T1. Progressive endplate degenerative changes are noted at C5-6 and to a greater extent at C6-7. There is straightening and some reversal of the normal cervical lordosis. Uncovertebral spurring at C5-6 and C6-7 is worse on the left. The prevertebral soft tissues are normal. The lung apices are clear.  IMPRESSION: 1. 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   Electronically Signed   By: Marin Robertshristopher  Mattern M.D.   On: 06/18/2015 21:14    MDM   Final diagnoses:  None   Patient with increasing neck pain and stiffness following a reported "pop" Patient received  treatment at a pain clinic, has history of chronic back pain and degenerative changes and her cervical spine She has no focal neurological deficits, no weakness Range of motion of neck shoulders and back is grossly normal  With some stiffness of the neck, I have given her treatment for torticollis, occluding Toradol shot and Valium for muscle spasm Cervical spine x-ray was obtained, which shows progressive degenerative endplate changes in C5-C7, worse on the left, which correlates with her pain symptoms on her left neck I have given her contact information for all the orthopedic groups in the area, though I explained to her that she will have to seek referral from PCP.  She verbalized her understanding with her pain clinic that she is not able to receive pain medicines or any other facilities, her prescription strength ibuprofen and Flexeril.  She seems to want to find a orthopedic surgeon or spine specialist who will assist her in getting a more permanent solution to her chronic and degenerative pain.  She is agreeable to discharge home at this time.  Return precautions were given and patient verbalized understanding.  Medications  ketorolac (TORADOL) injection 60 mg (60 mg Intramuscular Given 06/18/15 2105)  diazepam (VALIUM) injection 2.5 mg (2.5 mg Intravenous Given 06/18/15 2105)   Filed Vitals:   06/18/15 1925 06/18/15 2149  BP: 107/62 97/62  Pulse: 75 56  Temp: 98 F (36.7 C) 98.5 F (36.9 C)  TempSrc: Oral Oral  Resp: 16 12  Height: 5\' 4"  (1.626 m)   Weight: 130 lb 1 oz (58.996 kg)   SpO2: 100% 99%   vitals were reviewed and patient was discharged home, with family member driving her home  I personally performed the services described in this documentation, which was scribed in my presence. The recorded information has been reviewed and is accurate.     Danelle BerryLeisa Cambre Matson, PA-C 06/19/15 0301  Tilden FossaElizabeth Rees, MD 06/19/15 [phone]

## 2015-06-18 NOTE — Discharge Instructions (Signed)
Muscle Cramps and Spasms °Muscle cramps and spasms occur when a muscle or muscles tighten and you have no control over this tightening (involuntary muscle contraction). They are a common problem and can develop in any muscle. The most common place is in the calf muscles of the leg. Both muscle cramps and muscle spasms are involuntary muscle contractions, but they also have differences:  °· Muscle cramps are sporadic and painful. They may last a few seconds to a quarter of an hour. Muscle cramps are often more forceful and last longer than muscle spasms. °· Muscle spasms may or may not be painful. They may also last just a few seconds or much longer. °CAUSES  °It is uncommon for cramps or spasms to be due to a serious underlying problem. In many cases, the cause of cramps or spasms is unknown. Some common causes are:  °· Overexertion.   °· Overuse from repetitive motions (doing the same thing over and over).   °· Remaining in a certain position for a long period of time.   °· Improper preparation, form, or technique while performing a sport or activity.   °· Dehydration.   °· Injury.   °· Side effects of some medicines.   °· Abnormally low levels of the salts and ions in your blood (electrolytes), especially potassium and calcium. This could happen if you are taking water pills (diuretics) or you are pregnant.   °Some underlying medical problems can make it more likely to develop cramps or spasms. These include, but are not limited to:  °· Diabetes.   °· Parkinson disease.   °· Hormone disorders, such as thyroid problems.   °· Alcohol abuse.   °· Diseases specific to muscles, joints, and bones.   °· Blood vessel disease where not enough blood is getting to the muscles.   °HOME CARE INSTRUCTIONS  °· Stay well hydrated. Drink enough water and fluids to keep your urine clear or pale yellow. °· It may be helpful to massage, stretch, and relax the affected muscle. °· For tight or tense muscles, use a warm towel, heating  pad, or hot shower water directed to the affected area. °· If you are sore or have pain after a cramp or spasm, applying ice to the affected area may relieve discomfort. °¨ Put ice in a plastic bag. °¨ Place a towel between your skin and the bag. °¨ Leave the ice on for 15-20 minutes, 03-04 times a day. °· Medicines used to treat a known cause of cramps or spasms may help reduce their frequency or severity. Only take over-the-counter or prescription medicines as directed by your caregiver. °SEEK MEDICAL CARE IF:  °Your cramps or spasms get more severe, more frequent, or do not improve over time.  °MAKE SURE YOU:  °· Understand these instructions. °· Will watch your condition. °· Will get help right away if you are not doing well or get worse. °Document Released: 05/24/2002 Document Revised: 03/29/2013 Document Reviewed: 11/18/2012 °ExitCare® Patient Information ©2015 [employer]. This information is not intended to replace advice given to you by your health care provider. Make sure you discuss any questions you have with your health care provider. ° °Back Pain, Adult °Back pain is very common. The pain often gets better over time. The cause of back pain is usually not dangerous. Most people can learn to manage their back pain on their own.  °HOME CARE  °· Stay active. Start with short walks on flat ground if you can. Try to walk farther each day. °· Do not sit, drive, or stand in one place   for more than 30 minutes. Do not stay in bed.  Do not avoid exercise or work. Activity can help your back heal faster.  Be careful when you bend or lift an object. Bend at your knees, keep the object close to you, and do not twist.  Sleep on a firm mattress. Lie on your side, and bend your knees. If you lie on your back, put a pillow under your knees.  Only take medicines as told by your doctor.  Put ice on the injured area.  Put ice in a plastic bag.  Place a towel between your skin and the bag.  Leave the ice  on for 15-20 minutes, 03-04 times a day for the first 2 to 3 days. After that, you can switch between ice and heat packs.  Ask your doctor about back exercises or massage.  Avoid feeling anxious or stressed. Find good ways to deal with stress, such as exercise. GET HELP RIGHT AWAY IF:   Your pain does not go away with rest or medicine.  Your pain does not go away in 1 week.  You have new problems.  You do not feel well.  The pain spreads into your legs.  You cannot control when you poop (bowel movement) or pee (urinate).  Your arms or legs feel weak or lose feeling (numbness).  You feel sick to your stomach (nauseous) or throw up (vomit).  You have belly (abdominal) pain.  You feel like you may pass out (faint). MAKE SURE YOU:   Understand these instructions.  Will watch your condition.  Will get help right away if you are not doing well or get worse. Document Released: 05/20/2008 Document Revised: 02/24/2012 Document Reviewed: 04/05/2014 [facility] Patient Information 2015 [place], [place]. This information is not intended to replace advice given to you by your health care provider. Make sure you discuss any questions you have with your health care provider.

## 2015-06-18 NOTE — ED Notes (Signed)
Pt. reports neck pain/stiffness onset 2 days ago after lifting a tree stump, pain increases with movement .

## 2015-06-18 NOTE — ED Notes (Signed)
Patient sent to x-ray by another staff member.  

## 2015-07-04 ENCOUNTER — Telehealth: Payer: Self-pay | Admitting: Family Medicine

## 2015-07-04 MED ORDER — ZOLPIDEM TARTRATE 5 MG PO TABS: 5.0000 mg | ORAL_TABLET | Freq: Every evening | ORAL | Status: DC | PRN

## 2015-07-04 NOTE — Telephone Encounter (Signed)
Pt is needing a refill on her ambien  [facility]

## 2015-07-28 ENCOUNTER — Telehealth: Payer: Self-pay | Admitting: Family Medicine

## 2015-07-28 DIAGNOSIS — M542 Cervicalgia: Secondary | ICD-10-CM

## 2015-07-28 NOTE — Telephone Encounter (Signed)
Ok to do referral per dr Lorin Picket. Order for referral put in. Pt notified.

## 2015-07-28 NOTE — Telephone Encounter (Signed)
Pt states she has a UTI/Kidney infection She is having lower back pain, dark urine, strong smell  Using azo, drinking cranberry/apple juice To try an help but she is not getting better  She has been this way for about a week now and only  Getting worse  She would like to know if you can call in something for her instead Of her trying to drive in to [place] from [place] now  [facility]

## 2015-07-28 NOTE — Telephone Encounter (Signed)
Advised pt that dr cannot call in antibiotics without being seen. Advised pt to go to ed or urgent care. Pt would like referral to a different pain management.

## 2015-08-02 ENCOUNTER — Emergency Department (HOSPITAL_COMMUNITY)
Admission: EM | Admit: 2015-08-02 | Discharge: 2015-08-02 | Disposition: A | Discharge status: Home / Self Care | Payer: Medicaid Other | Source: Home / Self Care | Attending: Family Medicine | Admitting: Family Medicine

## 2015-08-02 ENCOUNTER — Encounter (HOSPITAL_COMMUNITY): Payer: Self-pay | Admitting: *Deleted

## 2015-08-02 DIAGNOSIS — N390 Urinary tract infection, site not specified: Secondary | ICD-10-CM

## 2015-08-02 LAB — POCT URINALYSIS DIP (DEVICE)
Bilirubin Urine: NEGATIVE
GLUCOSE, UA: NEGATIVE mg/dL
HGB URINE DIPSTICK: NEGATIVE
Ketones, ur: NEGATIVE mg/dL
Nitrite: NEGATIVE
PROTEIN: NEGATIVE mg/dL
SPECIFIC GRAVITY, URINE: 1.01 (ref 1.005–1.030)
UROBILINOGEN UA: 0.2 mg/dL (ref 0.0–1.0)
pH: 6 (ref 5.0–8.0)

## 2015-08-02 LAB — POCT PREGNANCY, URINE: Preg Test, Ur: NEGATIVE

## 2015-08-02 MED ORDER — CEPHALEXIN 500 MG PO CAPS: 500.0000 mg | ORAL_CAPSULE | Freq: Four times a day (QID) | ORAL | Status: DC

## 2015-08-02 NOTE — ED Provider Notes (Signed)
CSN: [id number]     Arrival date & time 08/02/15  1915 History   First MD Initiated Contact with Patient 08/02/15 2005     Chief Complaint  Patient presents with  . Back Pain   (Consider location/radiation/quality/duration/timing/severity/associated sxs/prior Treatment) Patient is a 34 y.o. female presenting with dysuria. The history is provided by the patient.  Dysuria Pain quality:  Burning Pain severity:  Moderate Onset quality:  Gradual Duration:  1 week Progression:  Worsening Chronicity:  Chronic Recent urinary tract infections: yes   Ineffective treatments:  Phenazopyridine Urinary symptoms: discolored urine, foul-smelling urine and frequent urination   Associated symptoms: abdominal pain, fever, flank pain and nausea   Risk factors: recurrent urinary tract infections     Past Medical History  Diagnosis Date  . Migraine   . Blood transfusion without reported diagnosis   . GERD (gastroesophageal reflux disease)     takes Prilosec as needed  . Cholelithiasis   . Constipation, chronic   . 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 Migraine headache   . Chronic insomnia    Past Surgical History  Procedure Laterality Date  . Renal artery stent    . Abdominal hysterectomy  08/27/2010  . Cholecystectomy  01/11/2013    Procedure: LAPAROSCOPIC CHOLECYSTECTOMY;  Surgeon: Shelly Rubenstein, MD;  Location: [facility];  Service: General;  Laterality: N/A;  Laparoscopic cholecystectomy  . Eye surgery Left     at 6mos old   Family History  Problem Relation Age of Onset  . Cancer Maternal Grandmother   . Cirrhosis Maternal Grandfather    Social History  Substance Use Topics  . Smoking status: Never Smoker   . Smokeless tobacco: Never Used  . Alcohol Use: No   OB History    No data available     Review of Systems  Constitutional: Positive for fever.  Gastrointestinal: Positive for nausea and abdominal pain.  Genitourinary: Positive for  dysuria, urgency, frequency and flank pain.    Allergies  Doxycycline; Hydrocodone; Macrolides and ketolides; and Trazodone and nefazodone  Home Medications   Prior to Admission medications   Medication Sig Start Date End Date Taking? Authorizing Provider  acyclovir (ZOVIRAX) 400 MG tablet Take 1 tablet (400 mg total) by mouth 4 (four) times daily. For 5 day 06/07/15   [name], MD  aspirin EC 81 MG tablet Take 1 tablet (81 mg total) by mouth daily. 08/15/14   Derwood Kaplan, MD  cephALEXin (KEFLEX) 500 MG capsule Take 1 capsule (500 mg total) by mouth 4 (four) times daily. Take all of medicine and drink lots of fluids 08/02/15   Linna Hoff, MD  ciprofloxacin (CIPRO) 500 MG tablet Take 1 tablet (500 mg total) by mouth 2 (two) times daily. 02/08/15   Jerelyn Scott, MD  cyclobenzaprine (FLEXERIL) 10 MG tablet Take 1 tablet (10 mg total) by mouth at bedtime. 09/27/14   [name], MD  ibuprofen (ADVIL,MOTRIN) 800 MG tablet Take 1 tablet (800 mg total) by mouth 3 (three) times daily. 06/18/15   [name], PA-C  ipratropium (ATROVENT) 0.06 % nasal spray Place 2 sprays into both nostrils 4 (four) times daily. 10/28/14   [name], MD  meloxicam (MOBIC) 15 MG tablet Take 1 tablet (15 mg total) by mouth daily. 10/28/14   [name], MD  methocarbamol (ROBAXIN) 500 MG tablet Take 1 tablet (500 mg total) by mouth 2 (two) times daily.  06/18/15   [name], PA-C  naproxen (NAPROSYN) 500 MG tablet Take 1 tablet (500 mg total) by mouth 2 (two) times daily with a meal. 09/27/14   [name], MD  oxyCODONE-acetaminophen (PERCOCET) 10-325 MG per tablet Take 1 tablet by mouth 3 (three) times daily as needed for pain. Do not exceed 3 tablets in a day 10/31/14   [name], MD  zolpidem (AMBIEN) 5 MG tablet Take 1 tablet (5 mg total) by mouth at bedtime as needed for sleep. Must devote 8 hrs to sleep 07/04/15   [name], MD   BP 117/74 mmHg  Pulse 57  Temp(Src) 99.2 F (37.3 C)  (Oral)  Resp 16  SpO2 100% Physical Exam  Constitutional: She is oriented to person, place, and time. She appears well-developed and well-nourished. No distress.  Abdominal: Soft. Bowel sounds are normal. She exhibits no distension and no mass. There is no tenderness. There is no rebound and no guarding.  Neurological: She is alert and oriented to person, place, and time.  Skin: Skin is warm and dry.  Nursing note and vitals reviewed.   ED Course  Procedures (including critical care time) Labs Review Labs Reviewed  POCT URINALYSIS DIP (DEVICE) - Abnormal; Notable for the following:    Leukocytes, UA TRACE (*)    All other components within normal limits  POCT PREGNANCY, URINE   U/a pos leuk Imaging Review No results found.   MDM   1. UTI (lower urinary tract infection)        Linna Hoff, MD 08/02/15 2030

## 2015-08-02 NOTE — Discharge Instructions (Signed)
Take all of medicine as directed, drink lots of fluids, see your doctor if further problems. °

## 2015-08-02 NOTE — ED Notes (Signed)
Pt  Reports   Symptoms  Of           Of  Low  Back  Pain          With  Scanty  Output    Symptoms  X  1   Week        Has  Had  uti  In  Past

## 2015-08-16 ENCOUNTER — Telehealth: Payer: Self-pay | Admitting: Family Medicine

## 2015-08-16 NOTE — Telephone Encounter (Signed)
Patient was referred to [facility] 08/01/15, they have [facility] with no response from pt

## 2015-08-17 ENCOUNTER — Encounter: Payer: Self-pay | Admitting: Family Medicine

## 2015-08-18 NOTE — Telephone Encounter (Signed)
Pt now has appointment 09/13/15 & [facility] contacted pt to schedule so pt is aware

## 2015-08-22 NOTE — Telephone Encounter (Signed)
Thank you :)

## 2015-09-28 ENCOUNTER — Other Ambulatory Visit: Payer: Self-pay | Admitting: Family Medicine

## 2015-09-28 NOTE — Telephone Encounter (Signed)
May have this and 2 refills 

## 2015-10-01 ENCOUNTER — Encounter (HOSPITAL_COMMUNITY): Payer: Self-pay | Admitting: Emergency Medicine

## 2015-10-01 ENCOUNTER — Emergency Department (INDEPENDENT_AMBULATORY_CARE_PROVIDER_SITE_OTHER)
Admission: EM | Admit: 2015-10-01 | Discharge: 2015-10-01 | Disposition: A | Discharge status: Home / Self Care | Payer: Medicaid Other | Source: Home / Self Care | Attending: Emergency Medicine | Admitting: Emergency Medicine

## 2015-10-01 DIAGNOSIS — S39012A Strain of muscle, fascia and tendon of lower back, initial encounter: Secondary | ICD-10-CM | POA: Diagnosis not present

## 2015-10-01 MED ORDER — DICLOFENAC POTASSIUM 50 MG PO TABS: 50.0000 mg | ORAL_TABLET | Freq: Three times a day (TID) | ORAL | Status: DC

## 2015-10-01 MED ORDER — CYCLOBENZAPRINE HCL 5 MG PO TABS: 5.0000 mg | ORAL_TABLET | Freq: Three times a day (TID) | ORAL | Status: DC | PRN

## 2015-10-01 MED ORDER — TRAMADOL HCL 50 MG PO TABS
50.0000 mg | ORAL_TABLET | Freq: Four times a day (QID) | ORAL | Status: DC | PRN
Start: 2015-10-01 — End: 2015-10-25

## 2015-10-01 NOTE — ED Provider Notes (Signed)
CSN: [id number]     Arrival date & time 10/01/15  1808 History   First MD Initiated Contact with Patient 10/01/15 1825     Chief Complaint  Patient presents with  . Back Pain   (Consider location/radiation/quality/duration/timing/severity/associated sxs/prior Treatment) HPI Comments: 34 year old female complaining of low back pain for one week. She states one week ago she was carrying heavy objects, fencing materials, and felt a pop in her low back. She has been having pain ever since. The pain is located over the lower lumbar spine and para lumbar musculature. It is worse with movement, bending or attempts at lifting. She stated the pain was not bad enough to go to her doctor this week until today when she started doing housework and her pain got much worse. The pain radiates down the left buttock and leg.   Past Medical History  Diagnosis Date  . Migraine   . Blood transfusion without reported diagnosis   . GERD (gastroesophageal reflux disease)     takes Prilosec as needed  . Cholelithiasis   . Constipation, chronic   . 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 Migraine headache   . Chronic insomnia    Past Surgical History  Procedure Laterality Date  . Renal artery stent    . Abdominal hysterectomy  08/27/2010  . Cholecystectomy  01/11/2013    Procedure: LAPAROSCOPIC CHOLECYSTECTOMY;  Surgeon: Shelly Rubensteinouglas A Blackman, MD;  Location: [facility];  Service: General;  Laterality: N/A;  Laparoscopic cholecystectomy  . Eye surgery Left     at 6mos old   Family History  Problem Relation Age of Onset  . Cancer Maternal Grandmother   . Cirrhosis Maternal Grandfather    Social History  Substance Use Topics  . Smoking status: Never Smoker   . Smokeless tobacco: Never Used  . Alcohol Use: No   OB History    No data available     Review of Systems  Constitutional: Positive for activity change. Negative for fever, chills and fatigue.  HENT: Negative.    Respiratory: Negative.   Genitourinary: Positive for frequency.  Musculoskeletal: Positive for myalgias, back pain and gait problem. Negative for joint swelling and neck pain.       As per HPI  Skin: Negative for color change, pallor and rash.  Neurological: Negative.   All other systems reviewed and are negative.   Allergies  Doxycycline; Hydrocodone; Macrolides and ketolides; and Trazodone and nefazodone  Home Medications   Prior to Admission medications   Medication Sig Start Date End Date Taking? Authorizing Provider  acyclovir (ZOVIRAX) 400 MG tablet Take 1 tablet (400 mg total) by mouth 4 (four) times daily. For 5 day 06/07/15   Babs SciaraScott A Luking, MD  aspirin EC 81 MG tablet Take 1 tablet (81 mg total) by mouth daily. 08/15/14   Derwood KaplanAnkit Nanavati, MD  cephALEXin (KEFLEX) 500 MG capsule Take 1 capsule (500 mg total) by mouth 4 (four) times daily. Take all of medicine and drink lots of fluids 08/02/15   Linna HoffJames D Kindl, MD  ciprofloxacin (CIPRO) 500 MG tablet Take 1 tablet (500 mg total) by mouth 2 (two) times daily. 02/08/15   Jerelyn ScottMartha Linker, MD  cyclobenzaprine (FLEXERIL) 5 MG tablet Take 1 tablet (5 mg total) by mouth 3 (three) times daily as needed for muscle spasms. 10/01/15   Hayden Rasmussenavid Stormy Sabol, NP  diclofenac (CATAFLAM) 50 MG tablet Take 1 tablet (50 mg total) by mouth 3 (three)  times daily. One tablet TID with food prn pain. 10/01/15   Hayden Rasmussen, NP  ibuprofen (ADVIL,MOTRIN) 800 MG tablet Take 1 tablet (800 mg total) by mouth 3 (three) times daily. 06/18/15   [name], PA-C  ipratropium (ATROVENT) 0.06 % nasal spray Place 2 sprays into both nostrils 4 (four) times daily. 10/28/14   [name], MD  meloxicam (MOBIC) 15 MG tablet Take 1 tablet (15 mg total) by mouth daily. 10/28/14   [name], MD  methocarbamol (ROBAXIN) 500 MG tablet Take 1 tablet (500 mg total) by mouth 2 (two) times daily. 06/18/15   [name], PA-C  naproxen (NAPROSYN) 500 MG tablet Take 1 tablet (500 mg total) by mouth  2 (two) times daily with a meal. 09/27/14   [name], MD  oxyCODONE-acetaminophen (PERCOCET) 10-325 MG per tablet Take 1 tablet by mouth 3 (three) times daily as needed for pain. Do not exceed 3 tablets in a day 10/31/14   [name], MD  traMADol (ULTRAM) 50 MG tablet Take 1 tablet (50 mg total) by mouth every 6 (six) hours as needed. 10/01/15   Hayden Rasmussen, NP  zolpidem (AMBIEN) 5 MG tablet TAKE 1 TABLET BY MOUTH AT BEDTIME AS NEEDED FOR SLEEP,MUST DEVOTE 8 HOURS TO SLEEP 09/29/15   [name], MD   Meds Ordered and Administered this Visit  Medications - No data to display  BP 118/80 mmHg  Pulse 66  Temp(Src) 98.4 F (36.9 C) (Oral)  Resp 16  SpO2 100% No data found.   Physical Exam  Constitutional: She is oriented to person, place, and time. She appears well-developed and well-nourished. No distress.  HENT:  Head: Normocephalic and atraumatic.  Eyes: EOM are normal.  Neck: Normal range of motion. Neck supple.  Cardiovascular: Normal rate.   Pulmonary/Chest: Effort normal. No respiratory distress.  Musculoskeletal: She exhibits tenderness. She exhibits no edema.  Inspection of the lower back reveals no swelling, discoloration or deformity of the spine. . Palpation of the lower lumbar spine produces tenderness. The greatest tenderness is to the left para lumbar musculature. Patient is able to get onto and off the exam table without assistance. She is able to stand upright and she is able to flex approximately 20 before experiencing low back pain. Having her sit and performing an axial load to the vertex of her head produces pain in the lower most part of her back. This is repeated and the patient states that placing weight on the top of her head causes pain in the low back. Lower extremity  strength is normal l.  Neurological: She is alert and oriented to person, place, and time. No cranial nerve deficit. She exhibits normal muscle tone.  Skin: Skin is warm and dry.   Nursing note and vitals reviewed.   ED Course  Procedures (including critical care time)  Labs Review Labs Reviewed - No data to display  Imaging Review No results found.   Visual Acuity Review  Right Eye Distance:   Left Eye Distance:   Bilateral Distance:    Right Eye Near:   Left Eye Near:    Bilateral Near:         MDM   1. Lumbar strain, initial encounter    Suspect some exaggeration of symptoms. Positive axial load test. Patient was able to get up and clean the house today and mopping sweeping vacuum and this exacerbated her pain. She is advised not to do this anymore that she  must rest her back although not stay in bed. She is to perform gradual stretches, apply heat. No heavy lifting or substantial bending. Cataflam 50 mg 3 times a day Tramadol 50 mg #15 Flexeril 5 mg 3 times a day when necessary Apply heat to the area pain Follow-up with your doctor early this week. Call for an appointment    Hayden Rasmussen, NP 10/01/15 [phone]

## 2015-10-01 NOTE — Discharge Instructions (Signed)
Back Injury Prevention Back injuries can be very painful. They can also be difficult to heal. After having one back injury, you are more likely to injure your back again. It is important to learn how to avoid injuring or re-injuring your back. The following tips can help you to prevent a back injury. WHAT SHOULD I KNOW ABOUT PHYSICAL FITNESS?  Exercise for 30 minutes per day on most days of the week or as told by your doctor. Make sure to:  Do aerobic exercises, such as walking, jogging, biking, or swimming.  Do exercises that increase balance and strength, such as tai chi and yoga.  Do stretching exercises. This helps with flexibility.  Try to develop strong belly (abdominal) muscles. Your belly muscles help to support your back.  Stay at a healthy weight. This helps to decrease your risk of a back injury. WHAT SHOULD I KNOW ABOUT MY DIET?  Talk with your doctor about your overall diet. Take supplements and vitamins only as told by your doctor.  Talk with your doctor about how much calcium and vitamin D you need each day. These nutrients help to prevent weakening of the bones (osteoporosis).  Include good sources of calcium in your diet, such as:  Dairy products.  Green leafy vegetables.  Products that have had calcium added to them (fortified).  Include good sources of vitamin D in your diet, such as:  Milk.  Foods that have had vitamin D added to them. WHAT SHOULD I KNOW ABOUT MY POSTURE?  Sit up straight and stand up straight. Avoid leaning forward when you sit or hunching over when you stand.  Choose chairs that have good low-back (lumbar) support.  If you work at a desk, sit close to it so you do not need to lean over. Keep your chin tucked in. Keep your neck drawn back. Keep your elbows bent so your arms look like the letter "L" (right angle).  Sit high and close to the steering wheel when you drive. Add a low-back support to your car seat, if needed.  Avoid sitting  or standing in one position for very long. Take breaks to get up, stretch, and walk around at least one time every hour. Take breaks every hour if you are driving for long periods of time.  Sleep on your side with your knees slightly bent, or sleep on your back with a pillow under your knees. Do not lie on the front of your body to sleep. WHAT SHOULD I KNOW ABOUT LIFTING, TWISTING, AND REACHING Lifting and Heavy Lifting  Avoid heavy lifting, especially lifting over and over again. If you must do heavy lifting:  Stretch before lifting.  Work slowly.  Rest between lifts.  Use a tool such as a cart or a dolly to move objects if one is available.  Make several small trips instead of carrying one heavy load.  Ask for help when you need it, especially when moving big objects.  Follow these steps when lifting:  Stand with your feet shoulder-width apart.  Get as close to the object as you can. Do not pick up a heavy object that is far from your body.  Use handles or lifting straps if they are available.  Bend at your knees. Squat down, but keep your heels off the floor.  Keep your shoulders back. Keep your chin tucked in. Keep your back straight.  Lift the object slowly while you tighten the muscles in your legs, belly, and butt. Keep the object  as close to the center of your body as possible.  Follow these steps when putting down a heavy load:  Stand with your feet shoulder-width apart.  Lower the object slowly while you tighten the muscles in your legs, belly, and butt. Keep the object as close to the center of your body as possible.  Keep your shoulders back. Keep your chin tucked in. Keep your back straight.  Bend at your knees. Squat down, but keep your heels off the floor.  Use handles or lifting straps if they are available. Twisting and Reaching  Avoid lifting heavy objects above your waist.  Do not twist at your waist while you are lifting or carrying a load. If  you need to turn, move your feet.  Do not bend over without bending at your knees.  Avoid reaching over your head, across a table, or for an object on a high surface.  WHAT ARE SOME OTHER TIPS?  Avoid wet floors and icy ground. Keep sidewalks clear of ice to prevent falls.   Do not sleep on a mattress that is too soft or too hard.   Keep items that you use often within easy reach.   Put heavier objects on shelves at waist level, and put lighter objects on lower or higher shelves.  Find ways to lower your stress, such as:  Exercise.  Massage.  Relaxation techniques.  Talk with your doctor if you feel anxious or depressed. These conditions can make back pain worse.  Wear flat heel shoes with cushioned soles.  Avoid making quick (sudden) movements.  Use both shoulder straps when carrying a backpack.  Do not use any tobacco products, including cigarettes, chewing tobacco, or electronic cigarettes. If you need help quitting, ask your doctor.   This information is not intended to replace advice given to you by your health care provider. Make sure you discuss any questions you have with your health care provider.   Document Released: 05/20/2008 Document Revised: 04/18/2015 Document Reviewed: 12/06/2014 [employer] Interactive Patient Education 2016 [employer] Strain With Rehab A strain is an injury in which a tendon or muscle is torn. The muscles and tendons of the lower back are vulnerable to strains. However, these muscles and tendons are very strong and require a great force to be injured. Strains are classified into three categories. Grade 1 strains cause pain, but the tendon is not lengthened. Grade 2 strains include a lengthened ligament, due to the ligament being stretched or partially ruptured. With grade 2 strains there is still function, although the function may be decreased. Grade 3 strains involve a complete tear of the tendon or muscle, and function is  usually impaired. SYMPTOMS   Pain in the lower back.  Pain that affects one side more than the other.  Pain that gets worse with movement and may be felt in the hip, buttocks, or back of the thigh.  Muscle spasms of the muscles in the back.  Swelling along the muscles of the back.  Loss of strength of the back muscles.  Crackling sound (crepitation) when the muscles are touched. CAUSES  Lower back strains occur when a force is placed on the muscles or tendons that is greater than they can handle. Common causes of injury include:  Prolonged overuse of the muscle-tendon units in the lower back, usually from incorrect posture.  A single violent injury or force applied to the back. RISK INCREASES WITH:  Sports that involve twisting forces on the spine  or a lot of bending at the waist (football, rugby, weightlifting, bowling, golf, tennis, speed skating, racquetball, swimming, running, gymnastics, diving).  Poor strength and flexibility.  Failure to warm up properly before activity.  Family history of lower back pain or disk disorders.  Previous back injury or surgery (especially fusion).  Poor posture with lifting, especially heavy objects.  Prolonged sitting, especially with poor posture. PREVENTION   Learn and use proper posture when sitting or lifting (maintain proper posture when sitting, lift using the knees and legs, not at the waist).  Warm up and stretch properly before activity.  Allow for adequate recovery between workouts.  Maintain physical fitness:  Strength, flexibility, and endurance.  Cardiovascular fitness. PROGNOSIS  If treated properly, lower back strains usually heal within 6 weeks. RELATED COMPLICATIONS   Recurring symptoms, resulting in a chronic problem.  Chronic inflammation, scarring, and partial muscle-tendon tear.  Delayed healing or resolution of symptoms.  Prolonged disability. TREATMENT  Treatment first involves the use of ice  and medicine, to reduce pain and inflammation. The use of strengthening and stretching exercises may help reduce pain with activity. These exercises may be performed at home or with a therapist. Severe injuries may require referral to a therapist for further evaluation and treatment, such as ultrasound. Your caregiver may advise that you wear a back brace or corset, to help reduce pain and discomfort. Often, prolonged bed rest results in greater harm then benefit. Corticosteroid injections may be recommended. However, these should be reserved for the most serious cases. It is important to avoid using your back when lifting objects. At night, sleep on your back on a firm mattress with a pillow placed under your knees. If non-surgical treatment is unsuccessful, surgery may be needed.  MEDICATION   If pain medicine is needed, nonsteroidal anti-inflammatory medicines (aspirin and ibuprofen), or other minor pain relievers (acetaminophen), are often advised.  Do not take pain medicine for 7 days before surgery.  Prescription pain relievers may be given, if your caregiver thinks they are needed. Use only as directed and only as much as you need.  Ointments applied to the skin may be helpful.  Corticosteroid injections may be given by your caregiver. These injections should be reserved for the most serious cases, because they may only be given a certain number of times. HEAT AND COLD  Cold treatment (icing) should be applied for 10 to 15 minutes every 2 to 3 hours for inflammation and pain, and immediately after activity that aggravates your symptoms. Use ice packs or an ice massage.  Heat treatment may be used before performing stretching and strengthening activities prescribed by your caregiver, physical therapist, or athletic trainer. Use a heat pack or a warm water soak. SEEK MEDICAL CARE IF:   Symptoms get worse or do not improve in 2 to 4 weeks, despite treatment.  You develop numbness, weakness,  or loss of bowel or bladder function.  New, unexplained symptoms develop. (Drugs used in treatment may produce side effects.) EXERCISES  RANGE OF MOTION (ROM) AND STRETCHING EXERCISES - Low Back Strain Most people with lower back pain will find that their symptoms get worse with excessive bending forward (flexion) or arching at the lower back (extension). The exercises which will help resolve your symptoms will focus on the opposite motion.  Your physician, physical therapist or athletic trainer will help you determine which exercises will be most helpful to resolve your lower back pain. Do not complete any exercises without first consulting with your  caregiver. Discontinue any exercises which make your symptoms worse until you speak to your caregiver.  If you have pain, numbness or tingling which travels down into your buttocks, leg or foot, the goal of the therapy is for these symptoms to move closer to your back and eventually resolve. Sometimes, these leg symptoms will get better, but your lower back pain may worsen. This is typically an indication of progress in your rehabilitation. Be very alert to any changes in your symptoms and the activities in which you participated in the 24 hours prior to the change. Sharing this information with your caregiver will allow him/her to most efficiently treat your condition.  These exercises may help you when beginning to rehabilitate your injury. Your symptoms may resolve with or without further involvement from your physician, physical therapist or athletic trainer. While completing these exercises, remember:  Restoring tissue flexibility helps normal motion to return to the joints. This allows healthier, less painful movement and activity.  An effective stretch should be held for at least 30 seconds.  A stretch should never be painful. You should only feel a gentle lengthening or release in the stretched tissue. FLEXION RANGE OF MOTION AND STRETCHING  EXERCISES: STRETCH - Flexion, Single Knee to Chest   Lie on a firm bed or floor with both legs extended in front of you.  Keeping one leg in contact with the floor, bring your opposite knee to your chest. Hold your leg in place by either grabbing behind your thigh or at your knee.  Pull until you feel a gentle stretch in your lower back. Hold __________ seconds.  Slowly release your grasp and repeat the exercise with the opposite side. Repeat __________ times. Complete this exercise __________ times per day.  STRETCH - Flexion, Double Knee to Chest   Lie on a firm bed or floor with both legs extended in front of you.  Keeping one leg in contact with the floor, bring your opposite knee to your chest.  Tense your stomach muscles to support your back and then lift your other knee to your chest. Hold your legs in place by either grabbing behind your thighs or at your knees.  Pull both knees toward your chest until you feel a gentle stretch in your lower back. Hold __________ seconds.  Tense your stomach muscles and slowly return one leg at a time to the floor. Repeat __________ times. Complete this exercise __________ times per day.  STRETCH - Low Trunk Rotation  Lie on a firm bed or floor. Keeping your legs in front of you, bend your knees so they are both pointed toward the ceiling and your feet are flat on the floor.  Extend your arms out to the side. This will stabilize your upper body by keeping your shoulders in contact with the floor.  Gently and slowly drop both knees together to one side until you feel a gentle stretch in your lower back. Hold for __________ seconds.  Tense your stomach muscles to support your lower back as you bring your knees back to the starting position. Repeat the exercise to the other side. Repeat __________ times. Complete this exercise __________ times per day  EXTENSION RANGE OF MOTION AND FLEXIBILITY EXERCISES: STRETCH - Extension, Prone on Elbows    Lie on your stomach on the floor, a bed will be too soft. Place your palms about shoulder width apart and at the height of your head.  Place your elbows under your shoulders. If this is too  painful, stack pillows under your chest.  Allow your body to relax so that your hips drop lower and make contact more completely with the floor.  Hold this position for __________ seconds.  Slowly return to lying flat on the floor. Repeat __________ times. Complete this exercise __________ times per day.  RANGE OF MOTION - Extension, Prone Press Ups  Lie on your stomach on the floor, a bed will be too soft. Place your palms about shoulder width apart and at the height of your head.  Keeping your back as relaxed as possible, slowly straighten your elbows while keeping your hips on the floor. You may adjust the placement of your hands to maximize your comfort. As you gain motion, your hands will come more underneath your shoulders.  Hold this position __________ seconds.  Slowly return to lying flat on the floor. Repeat __________ times. Complete this exercise __________ times per day.  RANGE OF MOTION- Quadruped, Neutral Spine   Assume a hands and knees position on a firm surface. Keep your hands under your shoulders and your knees under your hips. You may place padding under your knees for comfort.  Drop your head and point your tail bone toward the ground below you. This will round out your lower back like an angry cat. Hold this position for __________ seconds.  Slowly lift your head and release your tail bone so that your back sags into a large arch, like an old horse.  Hold this position for __________ seconds.  Repeat this until you feel limber in your lower back.  Now, find your "sweet spot." This will be the most comfortable position somewhere between the two previous positions. This is your neutral spine. Once you have found this position, tense your stomach muscles to support your  lower back.  Hold this position for __________ seconds. Repeat __________ times. Complete this exercise __________ times per day.  STRENGTHENING EXERCISES - Low Back Strain These exercises may help you when beginning to rehabilitate your injury. These exercises should be done near your "sweet spot." This is the neutral, low-back arch, somewhere between fully rounded and fully arched, that is your least painful position. When performed in this safe range of motion, these exercises can be used for people who have either a flexion or extension based injury. These exercises may resolve your symptoms with or without further involvement from your physician, physical therapist or athletic trainer. While completing these exercises, remember:   Muscles can gain both the endurance and the strength needed for everyday activities through controlled exercises.  Complete these exercises as instructed by your physician, physical therapist or athletic trainer. Increase the resistance and repetitions only as guided.  You may experience muscle soreness or fatigue, but the pain or discomfort you are trying to eliminate should never worsen during these exercises. If this pain does worsen, stop and make certain you are following the directions exactly. If the pain is still present after adjustments, discontinue the exercise until you can discuss the trouble with your caregiver. STRENGTHENING - Deep Abdominals, Pelvic Tilt  Lie on a firm bed or floor. Keeping your legs in front of you, bend your knees so they are both pointed toward the ceiling and your feet are flat on the floor.  Tense your lower abdominal muscles to press your lower back into the floor. This motion will rotate your pelvis so that your tail bone is scooping upwards rather than pointing at your feet or into the floor.  With a  gentle tension and even breathing, hold this position for __________ seconds. Repeat __________ times. Complete this exercise  __________ times per day.  STRENGTHENING - Abdominals, Crunches   Lie on a firm bed or floor. Keeping your legs in front of you, bend your knees so they are both pointed toward the ceiling and your feet are flat on the floor. Cross your arms over your chest.  Slightly tip your chin down without bending your neck.  Tense your abdominals and slowly lift your trunk high enough to just clear your shoulder blades. Lifting higher can put excessive stress on the lower back and does not further strengthen your abdominal muscles.  Control your return to the starting position. Repeat __________ times. Complete this exercise __________ times per day.  STRENGTHENING - Quadruped, Opposite UE/LE Lift   Assume a hands and knees position on a firm surface. Keep your hands under your shoulders and your knees under your hips. You may place padding under your knees for comfort.  Find your neutral spine and gently tense your abdominal muscles so that you can maintain this position. Your shoulders and hips should form a rectangle that is parallel with the floor and is not twisted.  Keeping your trunk steady, lift your right hand no higher than your shoulder and then your left leg no higher than your hip. Make sure you are not holding your breath. Hold this position __________ seconds.  Continuing to keep your abdominal muscles tense and your back steady, slowly return to your starting position. Repeat with the opposite arm and leg. Repeat __________ times. Complete this exercise __________ times per day.  STRENGTHENING - Lower Abdominals, Double Knee Lift  Lie on a firm bed or floor. Keeping your legs in front of you, bend your knees so they are both pointed toward the ceiling and your feet are flat on the floor.  Tense your abdominal muscles to brace your lower back and slowly lift both of your knees until they come over your hips. Be certain not to hold your breath.  Hold __________ seconds. Using your  abdominal muscles, return to the starting position in a slow and controlled manner. Repeat __________ times. Complete this exercise __________ times per day.  POSTURE AND BODY MECHANICS CONSIDERATIONS - Low Back Strain Keeping correct posture when sitting, standing or completing your activities will reduce the stress put on different body tissues, allowing injured tissues a chance to heal and limiting painful experiences. The following are general guidelines for improved posture. Your physician or physical therapist will provide you with any instructions specific to your needs. While reading these guidelines, remember:  The exercises prescribed by your provider will help you have the flexibility and strength to maintain correct postures.  The correct posture provides the best environment for your joints to work. All of your joints have less wear and tear when properly supported by a spine with good posture. This means you will experience a healthier, less painful body.  Correct posture must be practiced with all of your activities, especially prolonged sitting and standing. Correct posture is as important when doing repetitive low-stress activities (typing) as it is when doing a single heavy-load activity (lifting). RESTING POSITIONS Consider which positions are most painful for you when choosing a resting position. If you have pain with flexion-based activities (sitting, bending, stooping, squatting), choose a position that allows you to rest in a less flexed posture. You would want to avoid curling into a fetal position on your side. If your  pain worsens with extension-based activities (prolonged standing, working overhead), avoid resting in an extended position such as sleeping on your stomach. Most people will find more comfort when they rest with their spine in a more neutral position, neither too rounded nor too arched. Lying on a non-sagging bed on your side with a pillow between your knees, or on  your back with a pillow under your knees will often provide some relief. Keep in mind, being in any one position for a prolonged period of time, no matter how correct your posture, can still lead to stiffness. PROPER SITTING POSTURE In order to minimize stress and discomfort on your spine, you must sit with correct posture. Sitting with good posture should be effortless for a healthy body. Returning to good posture is a gradual process. Many people can work toward this most comfortably by using various supports until they have the flexibility and strength to maintain this posture on their own. When sitting with proper posture, your ears will fall over your shoulders and your shoulders will fall over your hips. You should use the back of the chair to support your upper back. Your lower back will be in a neutral position, just slightly arched. You may place a small pillow or folded towel at the base of your lower back for support.  When working at a desk, create an environment that supports good, upright posture. Without extra support, muscles tire, which leads to excessive strain on joints and other tissues. Keep these recommendations in mind: CHAIR:  A chair should be able to slide under your desk when your back makes contact with the back of the chair. This allows you to work closely.  The chair's height should allow your eyes to be level with the upper part of your monitor and your hands to be slightly lower than your elbows. BODY POSITION  Your feet should make contact with the floor. If this is not possible, use a foot rest.  Keep your ears over your shoulders. This will reduce stress on your neck and lower back. INCORRECT SITTING POSTURES  If you are feeling tired and unable to assume a healthy sitting posture, do not slouch or slump. This puts excessive strain on your back tissues, causing more damage and pain. Healthier options include:  Using more support, like a lumbar pillow.  Switching  tasks to something that requires you to be upright or walking.  Talking a brief walk.  Lying down to rest in a neutral-spine position. PROLONGED STANDING WHILE SLIGHTLY LEANING FORWARD  When completing a task that requires you to lean forward while standing in one place for a long time, place either foot up on a stationary 2-4 inch high object to help maintain the best posture. When both feet are on the ground, the lower back tends to lose its slight inward curve. If this curve flattens (or becomes too large), then the back and your other joints will experience too much stress, tire more quickly, and can cause pain. CORRECT STANDING POSTURES Proper standing posture should be assumed with all daily activities, even if they only take a few moments, like when brushing your teeth. As in sitting, your ears should fall over your shoulders and your shoulders should fall over your hips. You should keep a slight tension in your abdominal muscles to brace your spine. Your tailbone should point down to the ground, not behind your body, resulting in an over-extended swayback posture.  INCORRECT STANDING POSTURES  Common incorrect standing postures  include a forward head, locked knees and/or an excessive swayback. WALKING Walk with an upright posture. Your ears, shoulders and hips should all line-up. PROLONGED ACTIVITY IN A FLEXED POSITION When completing a task that requires you to bend forward at your waist or lean over a low surface, try to find a way to stabilize 3 out of 4 of your limbs. You can place a hand or elbow on your thigh or rest a knee on the surface you are reaching across. This will provide you more stability so that your muscles do not fatigue as quickly. By keeping your knees relaxed, or slightly bent, you will also reduce stress across your lower back. CORRECT LIFTING TECHNIQUES DO :   Assume a wide stance. This will provide you more stability and the opportunity to get as close as possible  to the object which you are lifting.  Tense your abdominals to brace your spine. Bend at the knees and hips. Keeping your back locked in a neutral-spine position, lift using your leg muscles. Lift with your legs, keeping your back straight.  Test the weight of unknown objects before attempting to lift them.  Try to keep your elbows locked down at your sides in order get the best strength from your shoulders when carrying an object.  Always ask for help when lifting heavy or awkward objects. INCORRECT LIFTING TECHNIQUES DO NOT:   Lock your knees when lifting, even if it is a small object.  Bend and twist. Pivot at your feet or move your feet when needing to change directions.  Assume that you can safely pick up even a paper clip without proper posture.   This information is not intended to replace advice given to you by your health care provider. Make sure you discuss any questions you have with your health care provider.   Document Released: 12/02/2005 Document Revised: 12/23/2014 Document Reviewed: 03/16/2009 Elsevier Interactive Patient Education 2016 [place].  Muscle Strain A muscle strain is an injury that occurs when a muscle is stretched beyond its normal length. Usually a small number of muscle fibers are torn when this happens. Muscle strain is rated in degrees. First-degree strains have the least amount of muscle fiber tearing and pain. Second-degree and third-degree strains have increasingly more tearing and pain.  Usually, recovery from muscle strain takes 1-2 weeks. Complete healing takes 5-6 weeks.  CAUSES  Muscle strain happens when a sudden, violent force placed on a muscle stretches it too far. This may occur with lifting, sports, or a fall.  RISK FACTORS Muscle strain is especially common in athletes.  SIGNS AND SYMPTOMS At the site of the muscle strain, there may be:  Pain.  Bruising.  Swelling.  Difficulty using the muscle due to pain or lack of normal  function. DIAGNOSIS  Your health care provider will perform a physical exam and ask about your medical history. TREATMENT  Often, the best treatment for a muscle strain is resting, icing, and applying cold compresses to the injured area.  HOME CARE INSTRUCTIONS   Use the PRICE method of treatment to promote muscle healing during the first 2-3 days after your injury. The PRICE method involves:  Protecting the muscle from being injured again.  Restricting your activity and resting the injured body part.  Icing your injury. To do this, put ice in a plastic bag. Place a towel between your skin and the bag. Then, apply the ice and leave it on from 15-20 minutes each hour. After the third  day, switch to moist heat packs.  Apply compression to the injured area with a splint or elastic bandage. Be careful not to wrap it too tightly. This may interfere with blood circulation or increase swelling.  Elevate the injured body part above the level of your heart as often as you can.  Only take over-the-counter or prescription medicines for pain, discomfort, or fever as directed by your health care provider.  Warming up prior to exercise helps to prevent future muscle strains. SEEK MEDICAL CARE IF:   You have increasing pain or swelling in the injured area.  You have numbness, tingling, or a significant loss of strength in the injured area. MAKE SURE YOU:   Understand these instructions.  Will watch your condition.  Will get help right away if you are not doing well or get worse.   This information is not intended to replace advice given to you by your health care provider. Make sure you discuss any questions you have with your health care provider.   Document Released: 12/02/2005 Document Revised: 09/22/2013 Document Reviewed: 07/01/2013 Elsevier Interactive Patient Education [employer].

## 2015-10-01 NOTE — ED Notes (Signed)
C/o lower back pain onset 7 days; reports she was lifting heavy obj while working Pain increases w/activity Slow, steady gait... A&O x4... No acute distress.

## 2015-10-13 ENCOUNTER — Other Ambulatory Visit: Payer: Self-pay | Admitting: *Deleted

## 2015-10-20 ENCOUNTER — Ambulatory Visit (INDEPENDENT_AMBULATORY_CARE_PROVIDER_SITE_OTHER): Payer: Medicaid Other | Admitting: Family Medicine

## 2015-10-20 VITALS — BP 130/82 | Ht 64.0 in | Wt 133.8 lb

## 2015-10-20 DIAGNOSIS — M5020 Other cervical disc displacement, unspecified cervical region: Secondary | ICD-10-CM | POA: Diagnosis not present

## 2015-10-20 DIAGNOSIS — G4700 Insomnia, unspecified: Secondary | ICD-10-CM | POA: Insufficient documentation

## 2015-10-20 MED ORDER — ZOLPIDEM TARTRATE 10 MG PO TABS: 10.0000 mg | ORAL_TABLET | Freq: Every evening | ORAL | Status: DC | PRN

## 2015-10-20 NOTE — Progress Notes (Signed)
   Subjective:    Patient ID: Heather Moon, female    DOB: 08/16/1981, 34 y.o.   MRN: [id number]  HPI Patient arrives to discuss Ambien. Patient was getting the 10mg  tablets and doing well and her dose was cut back to 5 mg and she has to to take 2 at a time and runs out in 2 weeks and cant refill early. Patient not happy with her 3rd pain clinic- they have significantly cut her pain med and it is not helping-she has to take extra and always runs out early.   Review of Systems  patient relates insomnia without the medicine. States it does very well when she takes it denies problems is long as she takes 10 mg denies drowsiness    Objective:   Physical Exam  lungs clear hearts regular       Assessment & Plan:   patient will follow-up with pain management for her pain treatment of her neck   insomnia Ambien 10 mg daily at bedtime devote at least 8 hours to sleep when taking medicine follow-up again in 6 months

## 2015-10-25 ENCOUNTER — Emergency Department (HOSPITAL_COMMUNITY)
Admission: EM | Admit: 2015-10-25 | Discharge: 2015-10-25 | Disposition: A | Discharge status: Home / Self Care | Payer: Medicaid Other | Attending: Emergency Medicine | Admitting: Emergency Medicine

## 2015-10-25 ENCOUNTER — Encounter (HOSPITAL_COMMUNITY): Payer: Self-pay | Admitting: Emergency Medicine

## 2015-10-25 DIAGNOSIS — Z792 Long term (current) use of antibiotics: Secondary | ICD-10-CM | POA: Insufficient documentation

## 2015-10-25 DIAGNOSIS — G4700 Insomnia, unspecified: Secondary | ICD-10-CM | POA: Insufficient documentation

## 2015-10-25 DIAGNOSIS — Z791 Long term (current) use of non-steroidal anti-inflammatories (NSAID): Secondary | ICD-10-CM | POA: Insufficient documentation

## 2015-10-25 DIAGNOSIS — Z9889 Other specified postprocedural states: Secondary | ICD-10-CM | POA: Insufficient documentation

## 2015-10-25 DIAGNOSIS — G43909 Migraine, unspecified, not intractable, without status migrainosus: Secondary | ICD-10-CM | POA: Diagnosis not present

## 2015-10-25 DIAGNOSIS — Z7982 Long term (current) use of aspirin: Secondary | ICD-10-CM | POA: Insufficient documentation

## 2015-10-25 DIAGNOSIS — M545 Low back pain: Secondary | ICD-10-CM | POA: Diagnosis present

## 2015-10-25 DIAGNOSIS — Z79899 Other long term (current) drug therapy: Secondary | ICD-10-CM | POA: Diagnosis not present

## 2015-10-25 DIAGNOSIS — N390 Urinary tract infection, site not specified: Secondary | ICD-10-CM

## 2015-10-25 DIAGNOSIS — Z8719 Personal history of other diseases of the digestive system: Secondary | ICD-10-CM | POA: Diagnosis not present

## 2015-10-25 HISTORY — DX: Urinary tract infection, site not specified: N39.0

## 2015-10-25 HISTORY — DX: Renal tubulo-interstitial disease, unspecified: N15.9

## 2015-10-25 LAB — URINALYSIS, ROUTINE W REFLEX MICROSCOPIC
Bilirubin Urine: NEGATIVE
GLUCOSE, UA: NEGATIVE mg/dL
HGB URINE DIPSTICK: NEGATIVE
KETONES UR: NEGATIVE mg/dL
Nitrite: NEGATIVE
PH: 6 (ref 5.0–8.0)
PROTEIN: NEGATIVE mg/dL
Specific Gravity, Urine: 1.004 — ABNORMAL LOW (ref 1.005–1.030)
Urobilinogen, UA: 0.2 mg/dL (ref 0.0–1.0)

## 2015-10-25 LAB — URINE MICROSCOPIC-ADD ON

## 2015-10-25 MED ORDER — SULFAMETHOXAZOLE-TRIMETHOPRIM 800-160 MG PO TABS: 1.0000 | ORAL_TABLET | Freq: Two times a day (BID) | ORAL | Status: AC

## 2015-10-25 NOTE — ED Notes (Signed)
C/o lower back pain, frequent urination, and foul smelling urine x 1 week.

## 2015-10-25 NOTE — ED Notes (Signed)
Pt stable, ambulatory, states understanding of discharge instructions 

## 2015-10-25 NOTE — Discharge Instructions (Signed)
Dysuria Dysuria is pain or discomfort while urinating. The pain or discomfort may be felt in the tube that carries urine out of the bladder (urethra) or in the surrounding tissue of the genitals. The pain may also be felt in the groin area, lower abdomen, and lower back. You may have to urinate frequently or have the sudden feeling that you have to urinate (urgency). Dysuria can affect both men and women, but is more common in women. Dysuria can be caused by many different things, including:  Urinary tract infection in women.  Infection of the kidney or bladder.  Kidney stones or bladder stones.  Certain sexually transmitted infections (STIs), such as chlamydia.  Dehydration.  Inflammation of the vagina.  Use of certain medicines.  Use of certain soaps or scented products that cause irritation. HOME CARE INSTRUCTIONS Watch your dysuria for any changes. The following actions may help to reduce any discomfort you are feeling:  Drink enough fluid to keep your urine clear or pale yellow.  Empty your bladder often. Avoid holding urine for long periods of time.  After a bowel movement or urination, women should cleanse from front to back, using each tissue only once.  Empty your bladder after sexual intercourse.  Take medicines only as directed by your health care provider.  If you were prescribed an antibiotic medicine, finish it all even if you start to feel better.  Avoid caffeine, tea, and alcohol. They can irritate the bladder and make dysuria worse. In men, alcohol may irritate the prostate.  Keep all follow-up visits as directed by your health care provider. This is important.  If you had any tests done to find the cause of dysuria, it is your responsibility to obtain your test results. Ask the lab or department performing the test when and how you will get your results. Talk with your health care provider if you have any questions about your results. SEEK MEDICAL CARE  IF:  You develop pain in your back or sides.  You have a fever.  You have nausea or vomiting.  You have blood in your urine.  You are not urinating as often as you usually do. SEEK IMMEDIATE MEDICAL CARE IF:  You pain is severe and not relieved with medicines.  You are unable to hold down any fluids.  You or someone else notices a change in your mental function.  You have a rapid heartbeat at rest.  You have shaking or chills.  You feel extremely weak.   This information is not intended to replace advice given to you by your health care provider. Make sure you discuss any questions you have with your health care provider.   Document Released: 08/30/2004 Document Revised: 12/23/2014 Document Reviewed: 07/28/2014 Elsevier Interactive Patient Education 2016 [employer]vier Inc[employer]  Urinary Tract Infection A urinary tract infection (UTI) can occur any place along the urinary tract. The tract includes the kidneys, ureters, bladder, and urethra. A type of germ called bacteria often causes a UTI. UTIs are often helped with antibiotic medicine.  HOME CARE   If given, take antibiotics as told by your doctor. Finish them even if you start to feel better.  Drink enough fluids to keep your pee (urine) clear or pale yellow.  Avoid tea, drinks with caffeine, and bubbly (carbonated) drinks.  Pee often. Avoid holding your pee in for a long time.  Pee before and after having sex (intercourse).  Wipe from front to back after you poop (bowel movement) if you are a  woman. Use each tissue only once. GET HELP RIGHT AWAY IF:   You have back pain.  You have lower belly (abdominal) pain.  You have chills.  You feel sick to your stomach (nauseous).  You throw up (vomit).  Your burning or discomfort with peeing does not go away.  You have a fever.  Your symptoms are not better in 3 days. MAKE SURE YOU:   Understand these instructions.  Will watch your condition.  Will get help right  away if you are not doing well or get worse.   This information is not intended to replace advice given to you by your health care provider. Make sure you discuss any questions you have with your health care provider.   Follow up with Primary Care Provider as needed. Follow up with urologist for evaluation of recurrent UTIs. Return to the Emergency Department if you experience worsening of your symptoms, abdominal pain, vomiting, fevers, difficulty urinating.

## 2015-10-26 ENCOUNTER — Telehealth: Payer: Self-pay | Admitting: Family Medicine

## 2015-10-26 DIAGNOSIS — N390 Urinary tract infection, site not specified: Secondary | ICD-10-CM

## 2015-10-26 NOTE — Telephone Encounter (Signed)
Pt is requesting a referral to [facility] for reoccurring UTI's. Please advise.

## 2015-10-26 NOTE — ED Provider Notes (Signed)
CSN: [id number]     Arrival date & time 10/25/15  1944 History   First MD Initiated Contact with Patient 10/25/15 2022     Chief Complaint  Patient presents with  . Back Pain  . Recurrent UTI     (Consider location/radiation/quality/duration/timing/severity/associated sxs/prior Treatment) HPI  Heather Moon is a 34 y.o F with a pmhx of recurrent UTI who presents to the ED c/o burning with urination and lower abdominal pain. Pt states that she began having dysuria 1 week ago. Also c/o increased urgency and frequency. Now with suprapubic tenderness after voiding. Pt states that she has had multiple UTIs per year since she was a young child. Denies fevers, chills, back pain, vaginal discharge, vaginal bleeding, vomiting, diarrhea.   Past Medical History  Diagnosis Date  . Migraine   . Blood transfusion without reported diagnosis   . GERD (gastroesophageal reflux disease)     takes Prilosec as needed  . Cholelithiasis   . Constipation, chronic   . 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 Migraine headache   . Chronic insomnia   . Kidney infection   . UTI (lower urinary tract infection)    Past Surgical History  Procedure Laterality Date  . Renal artery stent    . Abdominal hysterectomy  08/27/2010  . Cholecystectomy  01/11/2013    Procedure: LAPAROSCOPIC CHOLECYSTECTOMY;  Surgeon: Shelly Rubensteinouglas A Blackman, MD;  Location: [facility];  Service: General;  Laterality: N/A;  Laparoscopic cholecystectomy  . Eye surgery Left     at 6mos old   Family History  Problem Relation Age of Onset  . Cancer Maternal Grandmother   . Cirrhosis Maternal Grandfather    Social History  Substance Use Topics  . Smoking status: Never Smoker   . Smokeless tobacco: Never Used  . Alcohol Use: No   OB History    No data available     Review of Systems  All other systems reviewed and are negative.     Allergies  Doxycycline; Hydrocodone; Macrolides and ketolides; and  Trazodone and nefazodone  Home Medications   Prior to Admission medications   Medication Sig Start Date End Date Taking? Authorizing Provider  acyclovir (ZOVIRAX) 400 MG tablet Take 1 tablet (400 mg total) by mouth 4 (four) times daily. For 5 day 06/07/15  Yes Babs SciaraScott A Luking, MD  ibuprofen (ADVIL,MOTRIN) 800 MG tablet Take 1 tablet (800 mg total) by mouth 3 (three) times daily. 06/18/15  Yes Danelle BerryLeisa Tapia, PA-C  ipratropium (ATROVENT) 0.06 % nasal spray Place 2 sprays into both nostrils 4 (four) times daily. 10/28/14  Yes Charm RingsErin J Honig, MD  methocarbamol (ROBAXIN) 500 MG tablet Take 1 tablet (500 mg total) by mouth 2 (two) times daily. 06/18/15  Yes Danelle BerryLeisa Tapia, PA-C  oxyCODONE-acetaminophen (PERCOCET) 10-325 MG tablet Take 1 tablet by mouth every 4 (four) hours as needed for pain. Patient gets only 90 pills a month   Yes Historical Provider, MD  zolpidem (AMBIEN) 10 MG tablet Take 1 tablet (10 mg total) by mouth at bedtime as needed for sleep. 10/20/15  Yes Babs SciaraScott A Luking, MD  aspirin EC 81 MG tablet Take 1 tablet (81 mg total) by mouth daily. 08/15/14   Derwood KaplanAnkit Nanavati, MD  cyclobenzaprine (FLEXERIL) 5 MG tablet Take 1 tablet (5 mg total) by mouth 3 (three) times daily as needed for muscle spasms. 10/01/15   Hayden Rasmussenavid Mabe, NP  diclofenac (CATAFLAM) 50 MG tablet Take 1  tablet (50 mg total) by mouth 3 (three) times daily. One tablet TID with food prn pain. 10/01/15   Hayden Rasmussen, NP  meloxicam (MOBIC) 15 MG tablet Take 1 tablet (15 mg total) by mouth daily. 10/28/14   [name], MD  sulfamethoxazole-trimethoprim (BACTRIM DS,SEPTRA DS) 800-160 MG tablet Take 1 tablet by mouth 2 (two) times daily. 10/25/15 11/01/15  Bless Belshe Tripp Emyah Roznowski, PA-C   BP 105/65 mmHg  Pulse 54  Temp(Src) 98.2 F (36.8 C) (Oral)  Resp 18  Ht  (1.626 m)  Wt 134 lb (60.782 kg)  BMI 22.99 kg/m2  SpO2 100% Physical Exam  Constitutional: She is oriented to person, place, and time. She appears well-developed and well-nourished.  No distress.  HENT:  Head: Normocephalic and atraumatic.  Mouth/Throat: Oropharynx is clear and moist. No oropharyngeal exudate.  Eyes: Conjunctivae and EOM are normal. Pupils are equal, round, and reactive to light. Right eye exhibits no discharge. Left eye exhibits no discharge. No scleral icterus.  Cardiovascular: Normal rate, regular rhythm, normal heart sounds and intact distal pulses.  Exam reveals no gallop and no friction rub.   No murmur heard. Pulmonary/Chest: Effort normal and breath sounds normal. No respiratory distress. She has no wheezes. She has no rales. She exhibits no tenderness.  Abdominal: Soft. She exhibits no distension. There is no tenderness ( suprapubic TTP). There is no guarding.  No CVA tenderness  Musculoskeletal: Normal range of motion. She exhibits no edema.  Neurological: She is alert and oriented to person, place, and time.  Skin: Skin is warm and dry. No rash noted. She is not diaphoretic. No erythema. No pallor.  Psychiatric: She has a normal mood and affect. Her behavior is normal.  Nursing note and vitals reviewed.   ED Course  Procedures (including critical care time) Labs Review Labs Reviewed  URINALYSIS, ROUTINE W REFLEX MICROSCOPIC (NOT AT [facility]) - Abnormal; Notable for the following:    APPearance HAZY (*)    Specific Gravity, Urine 1.004 (*)    Leukocytes, UA TRACE (*)    All other components within normal limits  URINE MICROSCOPIC-ADD ON - Abnormal; Notable for the following:    Squamous Epithelial / LPF MANY (*)    Bacteria, UA FEW (*)    All other components within normal limits    Imaging Review No results found. I have personally reviewed and evaluated these images and lab results as part of my medical decision-making.   EKG Interpretation None      MDM   Final diagnoses:  UTI (lower urinary tract infection)  34 y.o F with dysuria, foul smelling urine, and increased urgency x 1week. Pt states that she has had multiple UTIs  per year since she was a young child. Pt has not seen a urologist. No vaginal discharge, vaginal bleeding, or fever. No CVA tenderness.  UA reveals trace leukocytes, pyuria, and few bacteria. Will treat with abx as pt is very symptomatic. Will also give referral to urologist for further evaluation. Upon review of her records, pt has been diagnosed with multiple, recurrent UTIs.   Discussed treatment plan with pt who is agreeable. Return precautions outlined in patient discharge instructions.      Lester Kinsman Aceitunas, PA-C 10/26/15 1620  Azalia Bilis, MD 10/27/15 [phone]

## 2015-10-26 NOTE — Telephone Encounter (Signed)
May have referral to urology for frequent UTIs

## 2015-10-27 NOTE — Telephone Encounter (Signed)
Left message on voicemail notifying patient that referral has been initiated.  

## 2015-11-15 ENCOUNTER — Encounter: Payer: Self-pay | Admitting: Family Medicine

## 2015-11-17 ENCOUNTER — Telehealth: Payer: Self-pay | Admitting: Family Medicine

## 2015-11-17 NOTE — Telephone Encounter (Signed)
Pt called to with referral question, explained and pt verbalized understanding.  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

## 2015-11-23 ENCOUNTER — Emergency Department (HOSPITAL_COMMUNITY): Payer: Medicaid Other

## 2015-11-23 ENCOUNTER — Encounter (HOSPITAL_COMMUNITY): Payer: Self-pay | Admitting: Emergency Medicine

## 2015-11-23 ENCOUNTER — Emergency Department (HOSPITAL_COMMUNITY)
Admission: EM | Admit: 2015-11-23 | Discharge: 2015-11-23 | Disposition: A | Discharge status: Home / Self Care | Payer: Medicaid Other | Attending: Emergency Medicine | Admitting: Emergency Medicine

## 2015-11-23 ENCOUNTER — Encounter: Payer: Medicaid Other | Admitting: Nurse Practitioner

## 2015-11-23 DIAGNOSIS — Y9289 Other specified places as the place of occurrence of the external cause: Secondary | ICD-10-CM | POA: Insufficient documentation

## 2015-11-23 DIAGNOSIS — Z791 Long term (current) use of non-steroidal anti-inflammatories (NSAID): Secondary | ICD-10-CM | POA: Insufficient documentation

## 2015-11-23 DIAGNOSIS — S4992XA Unspecified injury of left shoulder and upper arm, initial encounter: Secondary | ICD-10-CM | POA: Insufficient documentation

## 2015-11-23 DIAGNOSIS — Z7982 Long term (current) use of aspirin: Secondary | ICD-10-CM | POA: Diagnosis not present

## 2015-11-23 DIAGNOSIS — Z862 Personal history of diseases of the blood and blood-forming organs and certain disorders involving the immune mechanism: Secondary | ICD-10-CM | POA: Insufficient documentation

## 2015-11-23 DIAGNOSIS — M25519 Pain in unspecified shoulder: Secondary | ICD-10-CM

## 2015-11-23 DIAGNOSIS — Z79899 Other long term (current) drug therapy: Secondary | ICD-10-CM | POA: Insufficient documentation

## 2015-11-23 DIAGNOSIS — K219 Gastro-esophageal reflux disease without esophagitis: Secondary | ICD-10-CM | POA: Insufficient documentation

## 2015-11-23 DIAGNOSIS — Z87440 Personal history of urinary (tract) infections: Secondary | ICD-10-CM | POA: Insufficient documentation

## 2015-11-23 DIAGNOSIS — S6992XA Unspecified injury of left wrist, hand and finger(s), initial encounter: Secondary | ICD-10-CM | POA: Diagnosis not present

## 2015-11-23 DIAGNOSIS — M542 Cervicalgia: Secondary | ICD-10-CM

## 2015-11-23 DIAGNOSIS — S199XXA Unspecified injury of neck, initial encounter: Secondary | ICD-10-CM | POA: Insufficient documentation

## 2015-11-23 DIAGNOSIS — G4700 Insomnia, unspecified: Secondary | ICD-10-CM | POA: Insufficient documentation

## 2015-11-23 DIAGNOSIS — Y998 Other external cause status: Secondary | ICD-10-CM | POA: Insufficient documentation

## 2015-11-23 DIAGNOSIS — Y9389 Activity, other specified: Secondary | ICD-10-CM | POA: Insufficient documentation

## 2015-11-23 DIAGNOSIS — G43909 Migraine, unspecified, not intractable, without status migrainosus: Secondary | ICD-10-CM | POA: Diagnosis not present

## 2015-11-23 DIAGNOSIS — X58XXXA Exposure to other specified factors, initial encounter: Secondary | ICD-10-CM | POA: Insufficient documentation

## 2015-11-23 DIAGNOSIS — Z87448 Personal history of other diseases of urinary system: Secondary | ICD-10-CM | POA: Diagnosis not present

## 2015-11-23 MED ORDER — PREDNISONE 20 MG PO TABS
ORAL_TABLET | ORAL | Status: DC
Start: 2015-11-23 — End: 2016-01-20

## 2015-11-23 NOTE — Discharge Instructions (Signed)
Radicular Pain Radicular pain in either the arm or leg is usually from a bulging or herniated disk in the spine. A piece of the herniated disk may press against the nerves as the nerves exit the spine. This causes pain which is felt at the tips of the nerves down the arm or leg. Other causes of radicular pain may include:  Fractures.  Heart disease.  Cancer.  An abnormal and usually degenerative state of the nervous system or nerves (neuropathy). Diagnosis may require CT or MRI scanning to determine the primary cause.  Nerves that start at the neck (nerve roots) may cause radicular pain in the outer shoulder and arm. It can spread down to the thumb and fingers. The symptoms vary depending on which nerve root has been affected. In most cases radicular pain improves with conservative treatment. Neck problems may require physical therapy, a neck collar, or cervical traction. Treatment may take many weeks, and surgery may be considered if the symptoms do not improve.  Conservative treatment is also recommended for sciatica. Sciatica causes pain to radiate from the lower back or buttock area down the leg into the foot. Often there is a history of back problems. Most patients with sciatica are better after 2 to 4 weeks of rest and other supportive care. Short term bed rest can reduce the disk pressure considerably. Sitting, however, is not a good position since this increases the pressure on the disk. You should avoid bending, lifting, and all other activities which make the problem worse. Traction can be used in severe cases. Surgery is usually reserved for patients who do not improve within the first months of treatment. Only take over-the-counter or prescription medicines for pain, discomfort, or fever as directed by your caregiver. Narcotics and muscle relaxants may help by relieving more severe pain and spasm and by providing mild sedation. Cold or massage can give significant relief. Spinal manipulation  is not recommended. It can increase the degree of disc protrusion. Epidural steroid injections are often effective treatment for radicular pain. These injections deliver medicine to the spinal nerve in the space between the protective covering of the spinal cord and back bones (vertebrae). Your caregiver can give you more information about steroid injections. These injections are most effective when given within two weeks of the onset of pain.  You should see your caregiver for follow up care as recommended. A program for neck and back injury rehabilitation with stretching and strengthening exercises is an important part of management.  SEEK IMMEDIATE MEDICAL CARE IF:  You develop increased pain, weakness, or numbness in your arm or leg.  You develop difficulty with bladder or bowel control.  You develop abdominal pain.   This information is not intended to replace advice given to you by your health care provider. Make sure you discuss any questions you have with your health care provider.   Document Released: 01/09/2005 Document Revised: 12/23/2014 Document Reviewed: 06/28/2015 Elsevier Interactive Patient Education 2016 [employer]   ADD THE ROBAXIN TO YOUR EXISTING PAIN MANAGEMENT PLAN.  TAKE THE PREDNISONE AS DIRECTED.  FOLLOW UP WITH YOUR PRIMARY CARE  AND PAIN MANAGEMENT SPECIALIST.

## 2015-11-23 NOTE — ED Provider Notes (Signed)
CSN: [id number]     Arrival date & time 11/23/15  1845 History  By signing my name below, I, Tanda Rockers, attest that this documentation has been prepared under the direction and in the presence of Felicie Morn, NP. Electronically Signed: Tanda Rockers, ED Scribe. 11/23/2015. 7:17 PM.   Chief Complaint  Patient presents with  . Shoulder Pain   Patient is a 34 y.o. female presenting with neck injury. The history is provided by the patient. No language interpreter was used.  Neck Injury This is a new problem. The current episode started 3 to 5 hours ago. The problem occurs rarely. The problem has not changed since onset.Pertinent negatives include no chest pain, no abdominal pain, no headaches and no shortness of breath. Treatments tried: Ibuprofen and Percocet. The treatment provided no relief.     HPI Comments: Heather Moon is a 34 y.o. female with hx chronic neck pain currently at a pain clinic who presents to the Emergency Department complaining of sudden onset, constant, worsening, left neck pain radiating into occiput that began earlier today. Pt reports that she was digging holes with a power auger when it caught on something in the ground and started jerking. Pt states that she began jerking along with the machine, causing the neck pain. She also twisted her wrist while holding onto the machine and complains and left wrist and left thumb pain. She has been taking Ibuprofen and Percocet without relief. Denies weakness, numbness, tingling, or any other associated symptoms.   Past Medical History  Diagnosis Date  . Migraine   . Blood transfusion without reported diagnosis   . GERD (gastroesophageal reflux disease)     takes Prilosec as needed  . Cholelithiasis   . Constipation, chronic   . 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 Migraine headache   . Chronic insomnia   . Kidney infection   . UTI (lower urinary tract infection)    Past Surgical History  Procedure  Laterality Date  . Renal artery stent    . Abdominal hysterectomy  08/27/2010  . Cholecystectomy  01/11/2013    Procedure: LAPAROSCOPIC CHOLECYSTECTOMY;  Surgeon: Shelly Rubenstein, MD;  Location: [facility];  Service: General;  Laterality: N/A;  Laparoscopic cholecystectomy  . Eye surgery Left     at 6mos old   Family History  Problem Relation Age of Onset  . Cancer Maternal Grandmother   . Cirrhosis Maternal Grandfather    Social History  Substance Use Topics  . Smoking status: Never Smoker   . Smokeless tobacco: Never Used  . Alcohol Use: No   OB History    No data available     Review of Systems  Respiratory: Negative for shortness of breath.   Cardiovascular: Negative for chest pain.  Gastrointestinal: Negative for abdominal pain.  Musculoskeletal: Positive for arthralgias (Left wrist pain) and neck pain.  Neurological: Negative for weakness, numbness and headaches.  All other systems reviewed and are negative.     Allergies  Doxycycline; Hydrocodone; Macrolides and ketolides; and Trazodone and nefazodone  Home Medications   Prior to Admission medications   Medication Sig Start Date End Date Taking? Authorizing Provider  acyclovir (ZOVIRAX) 400 MG tablet Take 1 tablet (400 mg total) by mouth 4 (four) times daily. For 5 day 06/07/15   [name], MD  aspirin EC 81 MG tablet Take 1 tablet (81 mg total) by mouth daily. 08/15/14   Ankit Nanavati,  MD  cyclobenzaprine (FLEXERIL) 5 MG tablet Take 1 tablet (5 mg total) by mouth 3 (three) times daily as needed for muscle spasms. 10/01/15   Hayden Rasmussenavid Mabe, NP  diclofenac (CATAFLAM) 50 MG tablet Take 1 tablet (50 mg total) by mouth 3 (three) times daily. One tablet TID with food prn pain. 10/01/15   Hayden Rasmussenavid Mabe, NP  ibuprofen (ADVIL,MOTRIN) 800 MG tablet Take 1 tablet (800 mg total) by mouth 3 (three) times daily. 06/18/15   Danelle BerryLeisa Tapia, PA-C  ipratropium (ATROVENT) 0.06 % nasal spray Place 2 sprays into both nostrils  4 (four) times daily. 10/28/14   Charm RingsErin J Honig, MD  meloxicam (MOBIC) 15 MG tablet Take 1 tablet (15 mg total) by mouth daily. 10/28/14   Charm RingsErin J Honig, MD  methocarbamol (ROBAXIN) 500 MG tablet Take 1 tablet (500 mg total) by mouth 2 (two) times daily. 06/18/15   Danelle BerryLeisa Tapia, PA-C  oxyCODONE-acetaminophen (PERCOCET) 10-325 MG tablet Take 1 tablet by mouth every 4 (four) hours as needed for pain. Patient gets only 90 pills a month    Historical Provider, MD  zolpidem (AMBIEN) 10 MG tablet Take 1 tablet (10 mg total) by mouth at bedtime as needed for sleep. 10/20/15   Babs SciaraScott A Luking, MD   Triage Vitals: BP 107/69 mmHg  Pulse 73  Temp(Src) 98 F (36.7 C) (Oral)  Resp 15  SpO2 100%   Physical Exam  Constitutional: She is oriented to person, place, and time. She appears well-developed and well-nourished. No distress.  HENT:  Head: Normocephalic and atraumatic.  Eyes: Conjunctivae and EOM are normal.  Neck: Neck supple. No tracheal deviation present.  Midline tenderness in the mid cervical spine area but is neurologically intact  Cardiovascular: Normal rate and regular rhythm.   Pulmonary/Chest: Effort normal. No respiratory distress.  Musculoskeletal: Normal range of motion.  Neurological: She is alert and oriented to person, place, and time.  Skin: Skin is warm and dry.  Psychiatric: She has a normal mood and affect. Her behavior is normal.  Nursing note and vitals reviewed.   ED Course  Procedures (including critical care time)  DIAGNOSTIC STUDIES: Oxygen Saturation is 100% on RA, normal by my interpretation.    COORDINATION OF CARE: 7:14 PM-Discussed treatment plan which includes DG C Spine with pt at bedside and pt agreed to plan.   Labs Review Labs Reviewed - No data to display  Imaging Review Dg Cervical Spine Complete  11/23/2015  CLINICAL DATA:  Neck and left shoulder pain following injury at work today EXAM: CERVICAL SPINE - COMPLETE 4+ VIEW COMPARISON:  Radiographs  06/18/2015. FINDINGS: The prevertebral soft tissues are normal. There is stable reversal of the usual cervical lordosis without focal angulation or listhesis. There is a mild cervical thoracic scoliosis. Disc space loss and osteophytes are present at C5-6 and C6-7. There is no evidence of acute fracture or traumatic subluxation. The C1-2 articulation appears normal in the AP projection. IMPRESSION: No evidence of acute cervical spine fracture, traumatic subluxation or static signs of instability. Stable spondylosis at C5-6 and C6-7. Electronically Signed   By: Carey BullocksWilliam  Veazey M.D.   On: 11/23/2015 19:57   I have personally reviewed and evaluated these images as part of my medical decision-making.   EKG Interpretation None     Patient with history of chronic neck and shoulder pain.  She was working with an Scientist, product/process developmentauger drilling post holes when Nucor Corporationthe auger struck an object and suddenly jerked her, exacerbating her pain.  No acute findings  on c-spine films.  No red flag symptoms.  Will add short course of steroids to current pain management plan. MDM   Final diagnoses:  None   Exacerbation of chronic neck and shoulder pain. Care instructions provided. Return precautions discussed. Follow-up with PCP and pain management specialist.  I personally performed the services described in this documentation, which was scribed in my presence. The recorded information has been reviewed and is accurate.      Felicie Morn, NP 11/24/15 1610  Derwood Kaplan, MD 11/28/15 9604

## 2015-11-23 NOTE — ED Notes (Signed)
Was using an ogger today working and it "twisted me".  See PA notes

## 2015-12-15 ENCOUNTER — Ambulatory Visit: Payer: Self-pay | Admitting: Urology

## 2015-12-21 ENCOUNTER — Encounter: Payer: Medicaid Other | Admitting: Nurse Practitioner

## 2016-01-13 ENCOUNTER — Encounter (HOSPITAL_COMMUNITY): Payer: Self-pay | Admitting: *Deleted

## 2016-01-13 ENCOUNTER — Telehealth (HOSPITAL_COMMUNITY): Payer: Self-pay

## 2016-01-13 ENCOUNTER — Emergency Department (HOSPITAL_COMMUNITY)
Admission: EM | Admit: 2016-01-13 | Discharge: 2016-01-13 | Disposition: A | Discharge status: Home / Self Care | Payer: Medicaid Other | Attending: Emergency Medicine | Admitting: Emergency Medicine

## 2016-01-13 DIAGNOSIS — H9202 Otalgia, left ear: Secondary | ICD-10-CM | POA: Insufficient documentation

## 2016-01-13 DIAGNOSIS — Z791 Long term (current) use of non-steroidal anti-inflammatories (NSAID): Secondary | ICD-10-CM | POA: Diagnosis not present

## 2016-01-13 DIAGNOSIS — G4700 Insomnia, unspecified: Secondary | ICD-10-CM | POA: Diagnosis not present

## 2016-01-13 DIAGNOSIS — Z79899 Other long term (current) drug therapy: Secondary | ICD-10-CM | POA: Diagnosis not present

## 2016-01-13 DIAGNOSIS — Z87440 Personal history of urinary (tract) infections: Secondary | ICD-10-CM | POA: Insufficient documentation

## 2016-01-13 DIAGNOSIS — M542 Cervicalgia: Secondary | ICD-10-CM | POA: Diagnosis not present

## 2016-01-13 DIAGNOSIS — G43909 Migraine, unspecified, not intractable, without status migrainosus: Secondary | ICD-10-CM | POA: Diagnosis not present

## 2016-01-13 DIAGNOSIS — Z87448 Personal history of other diseases of urinary system: Secondary | ICD-10-CM | POA: Diagnosis not present

## 2016-01-13 DIAGNOSIS — J029 Acute pharyngitis, unspecified: Secondary | ICD-10-CM | POA: Diagnosis not present

## 2016-01-13 DIAGNOSIS — K219 Gastro-esophageal reflux disease without esophagitis: Secondary | ICD-10-CM | POA: Diagnosis not present

## 2016-01-13 DIAGNOSIS — Z7982 Long term (current) use of aspirin: Secondary | ICD-10-CM | POA: Insufficient documentation

## 2016-01-13 LAB — RAPID STREP SCREEN (MED CTR MEBANE ONLY): STREPTOCOCCUS, GROUP A SCREEN (DIRECT): NEGATIVE

## 2016-01-13 MED ORDER — DICLOFENAC SODIUM 1 % TD GEL: 2.0000 g | Freq: Four times a day (QID) | TRANSDERMAL | Status: DC

## 2016-01-13 MED ORDER — LIDOCAINE 5 % EX PTCH: 1.0000 | MEDICATED_PATCH | CUTANEOUS | Status: DC

## 2016-01-13 NOTE — Telephone Encounter (Signed)
Pt calling for rapid strep test results as she had to leave prior to test resulting.  Informed her rapid strep negative but a strep culture has also been sent ot [facility] and we will call positive results but it will take 2-3 days.

## 2016-01-13 NOTE — ED Provider Notes (Signed)
CSN: [id number]     Arrival date & time 01/13/16  1615 History   By signing my name below, I, Doreatha Martin, attest that this documentation has been prepared under the direction and in the presence of Kirsta Probert, PA-C. Electronically Signed: Doreatha Martin, ED Scribe. 01/13/2016. 5:04 PM.    Chief Complaint  Patient presents with  . Neck Pain   The history is provided by the patient. No language interpreter was used.    HPI Comments: Heather Moon is a 35 y.o. female who presents to the Emergency Department complaining of moderate, constant, pulling 6/10 left-sided neck pain for a week and a half with associated left-sided otalgia. Pt denies recent trauma, injury, sleeping in a strange position. She states h/o occasional neck pain, has been evaluated by her PCP and has had XR and MRI (last year) with no acute findings. Pt reports that she normally sleeps on her stomach and has felt discomfort doing so since onset of her current pain. She states pain is worsened with movement, lying flat, stretching and swallowing. She notes she has been taking Robaxin, ibuprofen and using a heating pad with no relief of pain. During the interview the patient adds that she has been worked up multiple times for neck pain similar to this pain today with no diagnosis made. Pt also notes that she has a sore throat but does not attribute this to her current symptoms. She reports the pain feels similar to a pulled muscle. Pt denies fever, chills, rash, nausea, emesis, decreased appetite or thirst, numbness, paresthesia, focal weakness.   Past Medical History  Diagnosis Date  . Migraine   . Blood transfusion without reported diagnosis   . GERD (gastroesophageal reflux disease)     takes Prilosec as needed  . Cholelithiasis   . Constipation, chronic   . 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 Migraine headache   . Chronic insomnia   . Kidney infection   . UTI (lower urinary tract infection)    Past Surgical  History  Procedure Laterality Date  . Renal artery stent    . Abdominal hysterectomy  08/27/2010  . Cholecystectomy  01/11/2013    Procedure: LAPAROSCOPIC CHOLECYSTECTOMY;  Surgeon: Shelly Rubenstein, MD;  Location: [facility];  Service: General;  Laterality: N/A;  Laparoscopic cholecystectomy  . Eye surgery Left     at 6mos old   Family History  Problem Relation Age of Onset  . Cancer Maternal Grandmother   . Cirrhosis Maternal Grandfather    Social History  Substance Use Topics  . Smoking status: Never Smoker   . Smokeless tobacco: Never Used  . Alcohol Use: No   OB History    No data available     Review of Systems  Constitutional: Negative for fever, chills and appetite change.  HENT: Positive for ear pain and sore throat.   Gastrointestinal: Negative for nausea and vomiting.  Musculoskeletal: Positive for neck pain.  Skin: Negative for rash.  Neurological: Negative for weakness and numbness.  All other systems reviewed and are negative.  Allergies  Doxycycline; Hydrocodone; Macrolides and ketolides; and Trazodone and nefazodone  Home Medications   Prior to Admission medications   Medication Sig Start Date End Date Taking? Authorizing Provider  acyclovir (ZOVIRAX) 400 MG tablet Take 1 tablet (400 mg total) by mouth 4 (four) times daily. For 5 day 06/07/15   [name], MD  aspirin EC 81 MG tablet  Take 1 tablet (81 mg total) by mouth daily. 08/15/14   Derwood Kaplan, MD  cyclobenzaprine (FLEXERIL) 5 MG tablet Take 1 tablet (5 mg total) by mouth 3 (three) times daily as needed for muscle spasms. 10/01/15   Hayden Rasmussen, NP  diclofenac (CATAFLAM) 50 MG tablet Take 1 tablet (50 mg total) by mouth 3 (three) times daily. One tablet TID with food prn pain. 10/01/15   Hayden Rasmussen, NP  diclofenac sodium (VOLTAREN) 1 % GEL Apply 2 g topically 4 (four) times daily. 01/13/16   Challis Crill C Doshie Maggi, PA-C  ibuprofen (ADVIL,MOTRIN) 800 MG tablet Take 1 tablet (800 mg total) by  mouth 3 (three) times daily. 06/18/15   [name], PA-C  ipratropium (ATROVENT) 0.06 % nasal spray Place 2 sprays into both nostrils 4 (four) times daily. 10/28/14   [name], MD  lidocaine (LIDODERM) 5 % Place 1 patch onto the skin daily. Remove & Discard patch within 12 hours or as directed by MD 01/13/16   Anselm Pancoast, PA-C  meloxicam (MOBIC) 15 MG tablet Take 1 tablet (15 mg total) by mouth daily. 10/28/14   [name], MD  methocarbamol (ROBAXIN) 500 MG tablet Take 1 tablet (500 mg total) by mouth 2 (two) times daily. 06/18/15   [name], PA-C  oxyCODONE-acetaminophen (PERCOCET) 10-325 MG tablet Take 1 tablet by mouth every 4 (four) hours as needed for pain. Patient gets only 90 pills a month    Historical Provider, MD  predniSONE (DELTASONE) 20 MG tablet 3 tabs po day one, then 2 tabs daily x 4 days 11/23/15   Felicie Morn, NP  zolpidem (AMBIEN) 10 MG tablet Take 1 tablet (10 mg total) by mouth at bedtime as needed for sleep. 10/20/15   [name], MD   BP 118/83 mmHg  Pulse 78  Temp(Src) 98.2 F (36.8 C) (Oral)  Resp 20  SpO2 95% Physical Exam  Constitutional: She is oriented to person, place, and time. She appears well-developed and well-nourished.  HENT:  Head: Normocephalic and atraumatic.  Right Ear: Tympanic membrane normal.  Left Ear: Tympanic membrane normal.  Mouth/Throat: Uvula is midline and oropharynx is clear and moist. No posterior oropharyngeal edema, posterior oropharyngeal erythema or tonsillar abscesses.  Uvula midline. No sign of erythema or edema in the posterior oropharynx. No signs of peritonsillar abscess. TMs clear bilaterally.   Eyes: Conjunctivae are normal. Pupils are equal, round, and reactive to light.  Neck: Normal range of motion. Neck supple. No spinous process tenderness and no muscular tenderness present.  No tenderness to the musculature of the left neck. No paraspinal or midline cervical spinal tenderness.   Cardiovascular: Normal rate.    Pulmonary/Chest: Effort normal. No respiratory distress.  Abdominal: She exhibits no distension.  Musculoskeletal: Normal range of motion.  Neurological: She is alert and oriented to person, place, and time. She has normal reflexes.  Strength 5/5 bilaterally. No sensory deficits. Coordination intact. FROM of the extremities.   Skin: Skin is warm and dry.  Psychiatric: She has a normal mood and affect. Her behavior is normal.  Nursing note and vitals reviewed.   ED Course  Procedures (including critical care time) DIAGNOSTIC STUDIES: Oxygen Saturation is 95% on RA, normal by my interpretation.    COORDINATION OF CARE: 4:59 PM Discussed treatment plan with pt at bedside which includes rapid strep, conservative therapies and home stretching and pt agreed to plan.  Results for orders placed or performed during the hospital encounter of 01/13/16  Rapid strep screen  Result Value Ref Range   Streptococcus, Group A Screen (Direct) NEGATIVE NEGATIVE   I have personally reviewed and evaluated these lab results as part of my medical decision-making.      MDM   Final diagnoses:  Neck pain    Kadejah Engh presents to the ED for evaluation of neck pain. No recent trauma or injures. No palpable masses, spinal tenderness or neurological deficits on exam. Suspect moderate to severe muscle strain. Recommended and discussed conservative home therapies with pt including stretching and continued antiinflammatory use. No red flags. No imaging is indicated at this time. Will prescribe diclofenac gel, since this is one of the few therapies the patient has not tried.. Pt advised to follow up with PCP for further referrals to ENT or orthopedics. Patient also complained of a sore throat on this visit; rapid strep negative. Patient will be discharged home & is agreeable with above plan. Returns precautions discussed. Pt appears safe for discharge.    I personally performed the services described in this  documentation, which was scribed in my presence. The recorded information has been reviewed and is accurate.    Anselm Pancoast, PA-C 01/13/16 1850  Gerhard Munch, MD 01/13/16 1901

## 2016-01-13 NOTE — ED Notes (Signed)
PT reports neck pain for 11/2 weeks ago. Pt reports no injury to neck. Pt reports certain movement make it worse.

## 2016-01-13 NOTE — ED Notes (Signed)
See PA assessment 

## 2016-01-13 NOTE — Discharge Instructions (Signed)
You have been seen today for neck pain. Follow up with PCP as as soon as possible for chronic management. Return to ED should symptoms worsen. Use heat or cold as desired. Use the diclofenac gel or the Lidoderm patch, which ever one works the best for you, but not both at the same time.

## 2016-01-13 NOTE — ED Notes (Signed)
Declined W/C at D/C and was escorted to lobby by RN. 

## 2016-01-15 LAB — CULTURE, GROUP A STREP (THRC)

## 2016-01-20 ENCOUNTER — Encounter (HOSPITAL_COMMUNITY): Payer: Self-pay | Admitting: *Deleted

## 2016-01-20 ENCOUNTER — Other Ambulatory Visit (HOSPITAL_COMMUNITY)
Admission: RE | Admit: 2016-01-20 | Discharge: 2016-01-20 | Disposition: A | Discharge status: Home / Self Care | Payer: Medicaid Other | Source: Ambulatory Visit | Attending: Emergency Medicine | Admitting: Emergency Medicine

## 2016-01-20 ENCOUNTER — Emergency Department (INDEPENDENT_AMBULATORY_CARE_PROVIDER_SITE_OTHER)
Admission: EM | Admit: 2016-01-20 | Discharge: 2016-01-20 | Disposition: A | Discharge status: Home / Self Care | Payer: Medicaid Other | Source: Home / Self Care | Attending: Emergency Medicine | Admitting: Emergency Medicine

## 2016-01-20 DIAGNOSIS — I889 Nonspecific lymphadenitis, unspecified: Secondary | ICD-10-CM | POA: Diagnosis not present

## 2016-01-20 DIAGNOSIS — R51 Headache: Secondary | ICD-10-CM | POA: Insufficient documentation

## 2016-01-20 DIAGNOSIS — N390 Urinary tract infection, site not specified: Secondary | ICD-10-CM

## 2016-01-20 DIAGNOSIS — R519 Headache, unspecified: Secondary | ICD-10-CM

## 2016-01-20 LAB — POCT URINALYSIS DIP (DEVICE)
Bilirubin Urine: NEGATIVE
GLUCOSE, UA: NEGATIVE mg/dL
Hgb urine dipstick: NEGATIVE
KETONES UR: NEGATIVE mg/dL
Nitrite: POSITIVE — AB
PROTEIN: NEGATIVE mg/dL
Specific Gravity, Urine: 1.015 (ref 1.005–1.030)
Urobilinogen, UA: 0.2 mg/dL (ref 0.0–1.0)
pH: 7 (ref 5.0–8.0)

## 2016-01-20 LAB — POCT PREGNANCY, URINE: PREG TEST UR: NEGATIVE

## 2016-01-20 MED ORDER — CIPROFLOXACIN HCL 500 MG PO TABS: 500.0000 mg | ORAL_TABLET | Freq: Two times a day (BID) | ORAL | Status: DC

## 2016-01-20 MED ORDER — KETOROLAC TROMETHAMINE 60 MG/2ML IM SOLN
INTRAMUSCULAR | Status: AC
  Filled 2016-01-20: qty 2

## 2016-01-20 MED ORDER — KETOROLAC TROMETHAMINE 60 MG/2ML IM SOLN
60.0000 mg | Freq: Once | INTRAMUSCULAR | Status: AC
  Administered 2016-01-20: 60 mg via INTRAMUSCULAR

## 2016-01-20 MED ORDER — CLINDAMYCIN HCL 300 MG PO CAPS: 300.0000 mg | ORAL_CAPSULE | Freq: Three times a day (TID) | ORAL | Status: DC

## 2016-01-20 NOTE — ED Notes (Signed)
C/O feeling feverish and low back pain - "I know I have a UTI".  Also c/o painful knot on left side of neck - saw PCP last week & had neg strep.  States her throat is not sore, but on left lateral side of neck it "pops" and makes it painful & difficult to swallow saliva; feels like it "locks up" and feels lump on outside of neck when she swallows.

## 2016-01-20 NOTE — Discharge Instructions (Signed)
You do have a urinary tract infection. Take Cipro twice a day for 7 days.  I am concerned that you have an infected lymph node in your neck. Take clindamycin 3 times a day for 7 days.  We gave you a shot of Toradol today to help with your headache.  Follow-up as needed.

## 2016-01-20 NOTE — ED Provider Notes (Signed)
CSN: [id number]     Arrival date & time 01/20/16  1944 History   First MD Initiated Contact with Patient 01/20/16 2050     Chief Complaint  Patient presents with  . Back Pain  . Neck Pain   (Consider location/radiation/quality/duration/timing/severity/associated sxs/prior Treatment) HPI  She is a 35 year old woman here for evaluation of neck pain and back pain.  She states the neck pain is located in her left neck. She reports pain with certain movements and with swallowing. She denies any throat pain or dental pain. She states it feels like something is pulling in her neck when she swallows. She was seen in the emergency room last week and had a negative strep test.  She thinks this is causing a headache. She describes it as a migraine headache.  She also reports low back pain associated with dysuria and urinary frequency for the last week. She does report some pain into her left flank. She reports subjective fevers. No nausea or vomiting.  Past Medical History  Diagnosis Date  . Migraine   . Blood transfusion without reported diagnosis   . GERD (gastroesophageal reflux disease)     takes Prilosec as needed  . Cholelithiasis   . Constipation, chronic   . 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 Migraine headache   . Chronic insomnia   . Kidney infection   . UTI (lower urinary tract infection)    Past Surgical History  Procedure Laterality Date  . Renal artery stent    . Abdominal hysterectomy  08/27/2010  . Cholecystectomy  01/11/2013    Procedure: LAPAROSCOPIC CHOLECYSTECTOMY;  Surgeon: Shelly Rubenstein, MD;  Location: [facility];  Service: General;  Laterality: N/A;  Laparoscopic cholecystectomy  . Eye surgery Left     at 6mos old   Family History  Problem Relation Age of Onset  . Cancer Maternal Grandmother   . Cirrhosis Maternal Grandfather    Social History  Substance Use Topics  . Smoking status: Never Smoker   . Smokeless tobacco: Never  Used  . Alcohol Use: No   OB History    No data available     Review of Systems As in history of present illness Allergies  Doxycycline; Hydrocodone; Macrolides and ketolides; and Trazodone and nefazodone  Home Medications   Prior to Admission medications   Medication Sig Start Date End Date Taking? Authorizing Provider  cyclobenzaprine (FLEXERIL) 5 MG tablet Take 1 tablet (5 mg total) by mouth 3 (three) times daily as needed for muscle spasms. 10/01/15  Yes Hayden Rasmussen, NP  ibuprofen (ADVIL,MOTRIN) 800 MG tablet Take 1 tablet (800 mg total) by mouth 3 (three) times daily. 06/18/15  Yes [name], PA-C  lidocaine (LIDODERM) 5 % Place 1 patch onto the skin daily. Remove & Discard patch within 12 hours or as directed by MD 01/13/16  Yes [name], PA-C  methocarbamol (ROBAXIN) 500 MG tablet Take 1 tablet (500 mg total) by mouth 2 (two) times daily. 06/18/15  Yes [name], PA-C  oxyCODONE-acetaminophen (PERCOCET) 10-325 MG tablet Take 1 tablet by mouth every 4 (four) hours as needed for pain. Patient gets only 90 pills a month   Yes Historical Provider, MD  zolpidem (AMBIEN) 10 MG tablet Take 1 tablet (10 mg total) by mouth at bedtime as needed for sleep. 10/20/15  Yes [name], MD  ciprofloxacin (CIPRO) 500 MG tablet Take 1 tablet (500 mg total) by  mouth 2 (two) times daily. 01/20/16   [name], MD  clindamycin (CLEOCIN) 300 MG capsule Take 1 capsule (300 mg total) by mouth 3 (three) times daily. 01/20/16   [name], MD  ipratropium (ATROVENT) 0.06 % nasal spray Place 2 sprays into both nostrils 4 (four) times daily. 10/28/14   [name], MD   Meds Ordered and Administered this Visit   Medications  ketorolac (TORADOL) injection 60 mg (not administered)    BP 121/62 mmHg  Pulse 70  Temp(Src) 99.6 F (37.6 C) (Oral)  Resp 18  SpO2 99% No data found.   Physical Exam  Constitutional: She is oriented to person, place, and time. She appears well-developed and  well-nourished. No distress.  Neck: Neck supple.  She appears to have a tender lymph node in the proximal left anterior chain  Cardiovascular: Normal rate.   Pulmonary/Chest: Effort normal.  Abdominal: Soft. Bowel sounds are normal. She exhibits no distension. There is tenderness (suprapubic). There is no rebound and no guarding.  Mild left CVA tenderness.  Musculoskeletal:  Diffuse lower back pain  Neurological: She is alert and oriented to person, place, and time.    ED Course  Procedures (including critical care time)  Labs Review Labs Reviewed  POCT URINALYSIS DIP (DEVICE) - Abnormal; Notable for the following:    Nitrite POSITIVE (*)    Leukocytes, UA TRACE (*)    All other components within normal limits  URINE CULTURE  POCT PREGNANCY, URINE    Imaging Review No results found.    MDM   1. UTI (lower urinary tract infection)   2. Lymphadenitis   3. Headache, unspecified headache type    Cipro for 1 week for UTI given concern for developing pyelonephritis. Clindamycin for possible lymphadenitis. We did give Toradol 60 mg IM here to help with headache. Follow-up as needed.   [name], MD 01/20/16 2116

## 2016-01-23 ENCOUNTER — Telehealth: Payer: Self-pay | Admitting: Internal Medicine

## 2016-01-23 DIAGNOSIS — N390 Urinary tract infection, site not specified: Secondary | ICD-10-CM

## 2016-01-23 LAB — URINE CULTURE

## 2016-01-23 MED ORDER — CEPHALEXIN 500 MG PO CAPS: 500.0000 mg | ORAL_CAPSULE | Freq: Two times a day (BID) | ORAL | Status: DC

## 2016-01-23 NOTE — ED Notes (Signed)
Patient received rx cipro at [facility] visit 01/20/16. Urine culture growing E coli: R to cipro but S to keflex.   Patient also received rx clindamycin for possible lymphadenitis at [facility] visit 01/20/16, but unclear that this would provide the needed gram negative coverage for UTI. Rx for keflex sent to [facility] on [address] at [employer] (pharmacy of record). Will ask clinical staff to let patient know.  Recheck or followup pcp/Scott Luking for persistent symptoms.    Eustace Moore, MD 01/23/16 1120

## 2016-01-27 ENCOUNTER — Telehealth (HOSPITAL_COMMUNITY): Payer: Self-pay | Admitting: Emergency Medicine

## 2016-01-27 NOTE — ED Notes (Signed)
Called pt and notified of recent lab results from visit 2/4 Pt ID'd properly... Reports feeling better but lymph node is still swollen  Per Dr. Dayton Scrape,  Pt seen at [facility] 01/20/16; rx'ed cipro (UTI) and clinda (possible lymphadenitis). Urine culture growing E coli R: cipro but S: keflex.  Rx keflex sent to [facility] on [address] at [facility].  Will ask clinical staff to notify patient.  Recheck or followup pcp/Scott Luking for persistent symptoms  Adv pt if sx are not getting better to return and to stop the other antibiotics and to complete the new course for Keflex Pt verb understanding.

## 2016-03-12 ENCOUNTER — Encounter: Payer: Self-pay | Admitting: Family Medicine

## 2016-03-12 ENCOUNTER — Ambulatory Visit (INDEPENDENT_AMBULATORY_CARE_PROVIDER_SITE_OTHER): Payer: Medicaid Other | Admitting: Family Medicine

## 2016-03-12 ENCOUNTER — Encounter (HOSPITAL_COMMUNITY): Payer: Self-pay | Admitting: Emergency Medicine

## 2016-03-12 VITALS — Temp 99.2°F | Wt 135.0 lb

## 2016-03-12 DIAGNOSIS — J111 Influenza due to unidentified influenza virus with other respiratory manifestations: Secondary | ICD-10-CM | POA: Diagnosis not present

## 2016-03-12 DIAGNOSIS — M545 Low back pain: Secondary | ICD-10-CM | POA: Diagnosis not present

## 2016-03-12 DIAGNOSIS — R112 Nausea with vomiting, unspecified: Secondary | ICD-10-CM | POA: Insufficient documentation

## 2016-03-12 DIAGNOSIS — R109 Unspecified abdominal pain: Secondary | ICD-10-CM | POA: Diagnosis present

## 2016-03-12 DIAGNOSIS — R509 Fever, unspecified: Secondary | ICD-10-CM | POA: Insufficient documentation

## 2016-03-12 DIAGNOSIS — R1031 Right lower quadrant pain: Secondary | ICD-10-CM | POA: Diagnosis not present

## 2016-03-12 MED ORDER — ACETAMINOPHEN 325 MG PO TABS
ORAL_TABLET | ORAL | Status: AC
  Filled 2016-03-12: qty 2

## 2016-03-12 MED ORDER — ACETAMINOPHEN 325 MG PO TABS
650.0000 mg | ORAL_TABLET | Freq: Once | ORAL | Status: AC
  Administered 2016-03-12: 650 mg via ORAL

## 2016-03-12 MED ORDER — OSELTAMIVIR PHOSPHATE 75 MG PO CAPS: 75.0000 mg | ORAL_CAPSULE | Freq: Two times a day (BID) | ORAL | Status: DC

## 2016-03-12 NOTE — Progress Notes (Signed)
   Subjective:    Patient ID: Heather Moon, female    DOB: 01/15/1981, 35 y.o.   MRN: [id number]  Cough This is a new problem. Episode onset: 2 days ago. Associated symptoms include ear pain, a fever, nasal congestion, rhinorrhea and a sore throat. Pertinent negatives include no chest pain, shortness of breath or wheezing. Treatments tried: theraflu.   Started yesterday on into today with body aches runny nose sore throat fever chills cough headache no other particular symptoms   Review of Systems  Constitutional: Positive for fever. Negative for activity change.  HENT: Positive for congestion, ear pain, rhinorrhea and sore throat.   Eyes: Negative for discharge.  Respiratory: Positive for cough. Negative for shortness of breath and wheezing.   Cardiovascular: Negative for chest pain.       Objective:   Physical Exam  Constitutional: She appears well-developed.  HENT:  Head: Normocephalic.  Nose: Nose normal.  Mouth/Throat: Oropharynx is clear and moist. No oropharyngeal exudate.  Neck: Neck supple.  Cardiovascular: Normal rate and normal heart sounds.   No murmur heard. Pulmonary/Chest: Effort normal and breath sounds normal. She has no wheezes.  Lymphadenopathy:    She has no cervical adenopathy.  Skin: Skin is warm and dry.  Nursing note and vitals reviewed.         Assessment & Plan:  Influenza-the patient was diagnosed with influenza. Patient/family educated about the flu and warning signs to watch for. If difficulty breathing, severe neck pain and stiffness, cyanosis, disorientation, or progressive worsening then immediately get rechecked at that ER. If progressive symptoms be certain to be rechecked. Supportive measures such as Tylenol/ibuprofen was discussed. No aspirin use in children. And influenza home care instruction sheet was given.

## 2016-03-12 NOTE — ED Notes (Signed)
Pt. reports right low abdominal pain , low back pain with nausea , vomitting and fever onset yesterday .

## 2016-03-13 ENCOUNTER — Emergency Department (HOSPITAL_COMMUNITY)
Admission: EM | Admit: 2016-03-13 | Discharge: 2016-03-13 | Disposition: A | Discharge status: Home / Self Care | Payer: Medicaid Other | Attending: Emergency Medicine | Admitting: Emergency Medicine

## 2016-03-13 LAB — COMPREHENSIVE METABOLIC PANEL
ALT: 12 U/L — AB (ref 14–54)
ANION GAP: 9 (ref 5–15)
AST: 14 U/L — AB (ref 15–41)
Albumin: 3.5 g/dL (ref 3.5–5.0)
Alkaline Phosphatase: 51 U/L (ref 38–126)
BILIRUBIN TOTAL: 0.9 mg/dL (ref 0.3–1.2)
BUN: 5 mg/dL — ABNORMAL LOW (ref 6–20)
CALCIUM: 8.7 mg/dL — AB (ref 8.9–10.3)
CO2: 25 mmol/L (ref 22–32)
CREATININE: 1.43 mg/dL — AB (ref 0.44–1.00)
Chloride: 104 mmol/L (ref 101–111)
GFR, EST AFRICAN AMERICAN: 53 mL/min — AB (ref >60)
GFR, EST NON AFRICAN AMERICAN: 46 mL/min — AB (ref >60)
Glucose, Bld: 102 mg/dL — ABNORMAL HIGH (ref 65–99)
Potassium: 4.2 mmol/L (ref 3.5–5.1)
SODIUM: 138 mmol/L (ref 135–145)
Total Protein: 6 g/dL — ABNORMAL LOW (ref 6.5–8.1)

## 2016-03-13 LAB — CBC
HEMATOCRIT: 38.8 % (ref 36.0–46.0)
Hemoglobin: 13.2 g/dL (ref 12.0–15.0)
MCH: 32.4 pg (ref 26.0–34.0)
MCHC: 34 g/dL (ref 30.0–36.0)
MCV: 95.1 fL (ref 78.0–100.0)
PLATELETS: 185 10*3/uL (ref 150–400)
RBC: 4.08 MIL/uL (ref 3.87–5.11)
RDW: 12.6 % (ref 11.5–15.5)
WBC: 9.5 10*3/uL (ref 4.0–10.5)

## 2016-03-13 LAB — URINALYSIS, ROUTINE W REFLEX MICROSCOPIC
BILIRUBIN URINE: NEGATIVE
Glucose, UA: NEGATIVE mg/dL
Hgb urine dipstick: NEGATIVE
KETONES UR: 15 mg/dL — AB
NITRITE: NEGATIVE
PH: 6.5 (ref 5.0–8.0)
PROTEIN: NEGATIVE mg/dL
Specific Gravity, Urine: 1.01 (ref 1.005–1.030)

## 2016-03-13 LAB — URINE MICROSCOPIC-ADD ON

## 2016-03-13 LAB — LIPASE, BLOOD: LIPASE: 23 U/L (ref 11–51)

## 2016-03-15 ENCOUNTER — Telehealth: Payer: Self-pay | Admitting: Family Medicine

## 2016-03-15 ENCOUNTER — Other Ambulatory Visit: Payer: Self-pay

## 2016-03-15 MED ORDER — ACYCLOVIR 400 MG PO TABS: ORAL_TABLET | ORAL | Status: DC

## 2016-03-15 NOTE — Telephone Encounter (Signed)
Busy signal x 4

## 2016-03-15 NOTE — Telephone Encounter (Signed)
Use otc blistex etc

## 2016-03-15 NOTE — Telephone Encounter (Signed)
Pt is requesting a cream to be called in for her cold sores.      [facility] [place]

## 2016-03-15 NOTE — Telephone Encounter (Signed)
Busy signal x2.

## 2016-04-02 ENCOUNTER — Other Ambulatory Visit: Payer: Self-pay | Admitting: Family Medicine

## 2016-04-03 ENCOUNTER — Other Ambulatory Visit: Payer: Self-pay | Admitting: Family Medicine

## 2016-04-03 NOTE — Telephone Encounter (Signed)
May have this and 3 refills 

## 2016-04-03 NOTE — Telephone Encounter (Signed)
May fill this plus four additional refills

## 2016-04-05 ENCOUNTER — Encounter (HOSPITAL_COMMUNITY): Payer: Self-pay | Admitting: Emergency Medicine

## 2016-04-05 ENCOUNTER — Emergency Department (HOSPITAL_COMMUNITY)
Admission: EM | Admit: 2016-04-05 | Discharge: 2016-04-05 | Disposition: A | Discharge status: Home / Self Care | Payer: Medicaid Other | Attending: Emergency Medicine | Admitting: Emergency Medicine

## 2016-04-05 ENCOUNTER — Other Ambulatory Visit: Payer: Self-pay | Admitting: Family Medicine

## 2016-04-05 ENCOUNTER — Emergency Department (HOSPITAL_COMMUNITY): Payer: Medicaid Other

## 2016-04-05 DIAGNOSIS — Y998 Other external cause status: Secondary | ICD-10-CM | POA: Diagnosis not present

## 2016-04-05 DIAGNOSIS — G4730 Sleep apnea, unspecified: Secondary | ICD-10-CM | POA: Diagnosis not present

## 2016-04-05 DIAGNOSIS — Y92410 Unspecified street and highway as the place of occurrence of the external cause: Secondary | ICD-10-CM | POA: Diagnosis not present

## 2016-04-05 DIAGNOSIS — Z87440 Personal history of urinary (tract) infections: Secondary | ICD-10-CM | POA: Insufficient documentation

## 2016-04-05 DIAGNOSIS — Z87448 Personal history of other diseases of urinary system: Secondary | ICD-10-CM | POA: Insufficient documentation

## 2016-04-05 DIAGNOSIS — Z791 Long term (current) use of non-steroidal anti-inflammatories (NSAID): Secondary | ICD-10-CM | POA: Insufficient documentation

## 2016-04-05 DIAGNOSIS — G8929 Other chronic pain: Secondary | ICD-10-CM | POA: Insufficient documentation

## 2016-04-05 DIAGNOSIS — Z8719 Personal history of other diseases of the digestive system: Secondary | ICD-10-CM | POA: Diagnosis not present

## 2016-04-05 DIAGNOSIS — Z8679 Personal history of other diseases of the circulatory system: Secondary | ICD-10-CM | POA: Diagnosis not present

## 2016-04-05 DIAGNOSIS — Z79899 Other long term (current) drug therapy: Secondary | ICD-10-CM | POA: Insufficient documentation

## 2016-04-05 DIAGNOSIS — S199XXA Unspecified injury of neck, initial encounter: Secondary | ICD-10-CM | POA: Insufficient documentation

## 2016-04-05 DIAGNOSIS — Y9389 Activity, other specified: Secondary | ICD-10-CM | POA: Insufficient documentation

## 2016-04-05 DIAGNOSIS — S8001XA Contusion of right knee, initial encounter: Secondary | ICD-10-CM | POA: Diagnosis not present

## 2016-04-05 DIAGNOSIS — S8991XA Unspecified injury of right lower leg, initial encounter: Secondary | ICD-10-CM | POA: Diagnosis present

## 2016-04-05 MED ORDER — HYDROMORPHONE HCL 1 MG/ML IJ SOLN
2.0000 mg | Freq: Once | INTRAMUSCULAR | Status: AC
  Administered 2016-04-05: 2 mg via INTRAMUSCULAR
  Filled 2016-04-05: qty 2

## 2016-04-05 MED ORDER — CYCLOBENZAPRINE HCL 10 MG PO TABS: 10.0000 mg | ORAL_TABLET | Freq: Three times a day (TID) | ORAL | Status: DC | PRN

## 2016-04-05 MED ORDER — OXYCODONE-ACETAMINOPHEN 5-325 MG PO TABS
2.0000 | ORAL_TABLET | Freq: Once | ORAL | Status: AC
  Administered 2016-04-05: 2 via ORAL
  Filled 2016-04-05: qty 2

## 2016-04-05 MED ORDER — IBUPROFEN 800 MG PO TABS: 800.0000 mg | ORAL_TABLET | Freq: Three times a day (TID) | ORAL | Status: DC | PRN
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
## 2016-04-07 ENCOUNTER — Encounter (HOSPITAL_COMMUNITY): Payer: Self-pay | Admitting: Emergency Medicine

## 2016-04-07 ENCOUNTER — Emergency Department (HOSPITAL_COMMUNITY)
Admission: EM | Admit: 2016-04-07 | Discharge: 2016-04-07 | Disposition: A | Discharge status: Home / Self Care | Payer: Medicaid Other | Attending: Emergency Medicine | Admitting: Emergency Medicine

## 2016-04-07 DIAGNOSIS — Y9389 Activity, other specified: Secondary | ICD-10-CM | POA: Diagnosis not present

## 2016-04-07 DIAGNOSIS — Z79899 Other long term (current) drug therapy: Secondary | ICD-10-CM | POA: Insufficient documentation

## 2016-04-07 DIAGNOSIS — Z87440 Personal history of urinary (tract) infections: Secondary | ICD-10-CM | POA: Insufficient documentation

## 2016-04-07 DIAGNOSIS — Y92410 Unspecified street and highway as the place of occurrence of the external cause: Secondary | ICD-10-CM | POA: Insufficient documentation

## 2016-04-07 DIAGNOSIS — Y998 Other external cause status: Secondary | ICD-10-CM | POA: Diagnosis not present

## 2016-04-07 DIAGNOSIS — G4700 Insomnia, unspecified: Secondary | ICD-10-CM | POA: Diagnosis not present

## 2016-04-07 DIAGNOSIS — S8001XA Contusion of right knee, initial encounter: Secondary | ICD-10-CM | POA: Insufficient documentation

## 2016-04-07 DIAGNOSIS — G43909 Migraine, unspecified, not intractable, without status migrainosus: Secondary | ICD-10-CM | POA: Insufficient documentation

## 2016-04-07 DIAGNOSIS — S8991XD Unspecified injury of right lower leg, subsequent encounter: Secondary | ICD-10-CM

## 2016-04-07 DIAGNOSIS — Z8719 Personal history of other diseases of the digestive system: Secondary | ICD-10-CM | POA: Insufficient documentation

## 2016-04-07 DIAGNOSIS — S8991XA Unspecified injury of right lower leg, initial encounter: Secondary | ICD-10-CM | POA: Diagnosis present

## 2016-04-07 MED ORDER — HYDROMORPHONE HCL 1 MG/ML IJ SOLN
1.0000 mg | Freq: Once | INTRAMUSCULAR | Status: AC
  Administered 2016-04-07: 1 mg via INTRAMUSCULAR
  Filled 2016-04-07: qty 1

## 2016-04-07 MED ORDER — OXYCODONE HCL 5 MG PO TABS: 5.0000 mg | ORAL_TABLET | ORAL | Status: DC | PRN

## 2016-04-07 MED ORDER — OXYCODONE-ACETAMINOPHEN 5-325 MG PO TABS
1.0000 | ORAL_TABLET | Freq: Once | ORAL | Status: AC
  Administered 2016-04-07: 1 via ORAL
  Filled 2016-04-07: qty 1

## 2016-04-07 NOTE — Discharge Instructions (Signed)
I AM GIVING YOU INFORMATION ON POSSIBLE TEAR OF A LIGAMENT IN YOUR KNEE. WE CAN NOT KNOW FOR SURE WITHOUT AN MRI. IT IS IMPORTANT THAT YOU FOLLOW UP WITH THE ORTHOPEDIC DOCTOR AS SOON AS POSSIBLE. CALL THE OFFICE TOMORROW AND TELL THEM YOU WERE SEEN IN THE ED AND REFERRED THERE.   DO NOT DRIVE OR DO ANY ACTIVITY THAT MAY CAUSE INJURY WHILE TAKING THE NARCOTIC PAIN MEDICATION.
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
## 2016-04-08 NOTE — Telephone Encounter (Signed)
This and 4 refills 

## 2016-04-23 ENCOUNTER — Other Ambulatory Visit (HOSPITAL_COMMUNITY): Payer: Self-pay | Admitting: Psychiatry

## 2016-04-23 ENCOUNTER — Ambulatory Visit (HOSPITAL_COMMUNITY)
Admission: RE | Admit: 2016-04-23 | Discharge: 2016-04-23 | Disposition: A | Discharge status: Home / Self Care | Payer: Medicaid Other | Source: Ambulatory Visit | Attending: Psychiatry | Admitting: Psychiatry

## 2016-04-23 DIAGNOSIS — G8929 Other chronic pain: Secondary | ICD-10-CM

## 2016-04-23 DIAGNOSIS — M25561 Pain in right knee: Secondary | ICD-10-CM | POA: Diagnosis present

## 2016-04-23 DIAGNOSIS — M545 Low back pain: Secondary | ICD-10-CM | POA: Insufficient documentation

## 2016-04-23 DIAGNOSIS — R102 Pelvic and perineal pain: Secondary | ICD-10-CM | POA: Diagnosis not present

## 2016-04-29 ENCOUNTER — Ambulatory Visit: Payer: Medicaid Other | Admitting: Family Medicine

## 2016-04-29 ENCOUNTER — Ambulatory Visit: Payer: Medicaid Other | Attending: Orthopaedic Surgery

## 2016-04-29 DIAGNOSIS — M25661 Stiffness of right knee, not elsewhere classified: Secondary | ICD-10-CM | POA: Insufficient documentation

## 2016-04-29 DIAGNOSIS — R600 Localized edema: Secondary | ICD-10-CM | POA: Diagnosis present

## 2016-04-29 DIAGNOSIS — R262 Difficulty in walking, not elsewhere classified: Secondary | ICD-10-CM | POA: Diagnosis present

## 2016-04-29 DIAGNOSIS — M25561 Pain in right knee: Secondary | ICD-10-CM | POA: Insufficient documentation

## 2016-04-29 NOTE — Patient Instructions (Signed)
Issued from cabinet flexion and extension stretching options for RT knee 20-30 sec 3x/day and also SLR Lt side lye and prone knee flexion and quad sets 20-30 reps 2-3 x/day
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
## 2016-05-06 ENCOUNTER — Ambulatory Visit: Payer: Medicaid Other

## 2016-05-06 DIAGNOSIS — R262 Difficulty in walking, not elsewhere classified: Secondary | ICD-10-CM

## 2016-05-06 DIAGNOSIS — M25661 Stiffness of right knee, not elsewhere classified: Secondary | ICD-10-CM

## 2016-05-06 DIAGNOSIS — R600 Localized edema: Secondary | ICD-10-CM

## 2016-05-06 DIAGNOSIS — M25561 Pain in right knee: Secondary | ICD-10-CM

## 2016-05-06 NOTE — Therapy (Signed)
[facility] [address] [place], [place], [address] Phone: [phone]   Fax:  [phone]  Physical Therapy Treatment  Patient Details  Name: Heather Moon MRN: [id number] Date of Birth: 09/29/1981 Referring Provider: Marcene Corning, Md  Encounter Date: 05/06/2016      PT End of Session - 05/06/16 1136    Visit Number 2   Number of Visits 16   Date for PT Re-Evaluation 06/24/16   PT Start Time 1133   PT Stop Time 1212   PT Time Calculation (min) 39 min   Activity Tolerance Patient tolerated treatment well;No increased pain   Behavior During Therapy [facility] for tasks assessed/performed      Past Medical History  Diagnosis Date  . Migraine   . Blood transfusion without reported diagnosis   . GERD (gastroesophageal reflux disease)     takes Prilosec as needed  . Cholelithiasis   . Constipation, chronic   . 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 Migraine headache   . Chronic insomnia   . Kidney infection   . UTI (lower urinary tract infection)     Past Surgical History  Procedure Laterality Date  . Renal artery stent    . Abdominal hysterectomy  08/27/2010  . Cholecystectomy  01/11/2013    Procedure: LAPAROSCOPIC CHOLECYSTECTOMY;  Surgeon: Shelly Rubenstein, MD;  Location: [facility];  Service: General;  Laterality: N/A;  Laparoscopic cholecystectomy  . Eye surgery Left     at 6mos old    There were no vitals filed for this visit.      Subjective Assessment - 05/06/16 1135    Subjective I get cramps in my knee   Currently in Pain? Yes   Pain Score 5    Pain Location Knee   Pain Orientation Right   Pain Descriptors / Indicators Aching;Throbbing   Pain Type Acute pain   Pain Onset More than a month ago   Multiple Pain Sites No            [facility] PT Assessment - 05/06/16 0001    AROM   Right Knee Extension -5  sitting with LAQ but rest on mat in -10 degrees extesnion                      [facility] Adult PT Treatment/Exercise - 05/06/16 0001    Neuro Re-ed    Neuro Re-ed Details  worked on terminal extension with walking for omproved gait pattern   Exercises   Exercises Knee/Hip   Knee/Hip Exercises: Aerobic   Nustep 5 min LE only L4   Knee/Hip Exercises: Standing   Heel Raises Limitations heel and toe raises x 5 then walk with heels and toes 12 feet x 2 each   Knee/Hip Exercises: Seated   Long Arc Quad 15 reps;Right;Strengthening   Long Arc Quad Weight 4 lbs.   Knee/Hip Exercises: Supine   Quad Sets Right;15 reps   Quad Sets Limitations tactile cues for incr quad activity and extension ROM   Straight Leg Raises Strengthening;Right;2 sets;10 reps   Knee/Hip Exercises: Sidelying   Hip ABduction Strengthening;Right;10 reps;2 sets   Knee/Hip Exercises: Prone   Hamstring Curl 20 reps   Hamstring Curl Limitations 3 pounds   Hip Extension Strengthening;Right;10 reps   Hip Extension Limitations 3 pounds   Other Prone Exercises TKE x 25 foot on toe under ankle  PT Short Term Goals - 04/29/16 1330    PT SHORT TERM GOAL #1   Title she will be independent with initial HEP   Time 4   Period Weeks   Status New   PT SHORT TERM GOAL #2   Title She will report pain decr 50% or more    Time 4   Period Weeks   Status New   PT SHORT TERM GOAL #3   Title Active RT knee ROM equal to LT knee   Time 4   Period Weeks   Status New   PT SHORT TERM GOAL #4   Title She will be walking without device in and out of home   Time 4   Period Weeks   Status New           PT Long Term Goals - 04/29/16 1331    PT LONG TERM GOAL #1   Title She will be independent with all HEP issued    Time 8   Period Weeks   Status New   PT LONG TERM GOAL #2   Title She will report intermittant pain RT knee   Time 8   Period Weeks   Status New   PT LONG TERM GOAL #3   Title She will be 30 pounds or more to able to carry for return to  work   Time 8   Period Weeks   Status New   PT LONG TERM GOAL #4   Title she will be able to walk up and down steps step over step  with one rail safely   Time 8   Period Weeks   Status New   PT LONG TERM GOAL #5   Title She wil report return to all normal home tasks   Time 8   Period Weeks   Status New               Plan - 05/06/16 1139    Clinical Impression Statement She is much improved with tolerance to activity , increased motion and is now walking without device and extending knee more. She still walks with knee/LE flexed  but less today. Explained probable nerve irritation for unusual feeling in knee and more use of gastroc without device with walking.   PT Next Visit Plan Continue strength and ROM   Consulted and Agree with Plan of Care Patient      Patient will benefit from skilled therapeutic intervention in order to improve the following deficits and impairments:  Difficulty walking, Pain, Increased muscle spasms, Decreased activity tolerance, Decreased strength, Increased edema  Visit Diagnosis: Stiffness of right knee, not elsewhere classified  Pain in right knee  Difficulty in walking, not elsewhere classified  Localized edema     Problem List Patient Active Problem List   Diagnosis Date Noted  . Insomnia 10/20/2015  . Herniated cervical disc 03/24/2013  . Muscle spasms of head and/or neck 03/05/2013  . Left shoulder pain 03/05/2013  . Migraine headache without aura 03/05/2013    Caprice RedChasse, Cid Agena M  PT 05/06/2016, 12:18 PM  [facility] [address] [place], [place], [address] Phone: [phone]   Fax:  [phone]  Name: Heather Rushingabitha Delo MRN: [id number] Date of Birth: Aug 06, 1981

## 2016-05-08 ENCOUNTER — Ambulatory Visit: Payer: Medicaid Other | Admitting: Physical Therapy

## 2016-05-08 ENCOUNTER — Encounter: Payer: Self-pay | Admitting: Physical Therapy

## 2016-05-08 DIAGNOSIS — R262 Difficulty in walking, not elsewhere classified: Secondary | ICD-10-CM

## 2016-05-08 DIAGNOSIS — M25661 Stiffness of right knee, not elsewhere classified: Secondary | ICD-10-CM

## 2016-05-08 DIAGNOSIS — M25561 Pain in right knee: Secondary | ICD-10-CM

## 2016-05-08 DIAGNOSIS — R600 Localized edema: Secondary | ICD-10-CM

## 2016-05-08 NOTE — Therapy (Signed)
[facility] [address] [place], [place], [address] Phone: [phone]   Fax:  [phone]  Physical Therapy Treatment  Patient Details  Name: Heather Moon MRN: [id number] Date of Birth: 1981-07-24 Referring Provider: Marcene Corning, Md  Encounter Date: 05/08/2016      PT End of Session - 05/08/16 1152    Visit Number 3   Number of Visits 16   Date for PT Re-Evaluation 06/24/16   Authorization Type Medpay   PT Start Time 1150   PT Stop Time 1235   PT Time Calculation (min) 45 min   Activity Tolerance Patient tolerated treatment well;Patient limited by pain   Behavior During Therapy [facility] for tasks assessed/performed      Past Medical History  Diagnosis Date  . Migraine   . Blood transfusion without reported diagnosis   . GERD (gastroesophageal reflux disease)     takes Prilosec as needed  . Cholelithiasis   . Constipation, chronic   . 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 Migraine headache   . Chronic insomnia   . Kidney infection   . UTI (lower urinary tract infection)     Past Surgical History  Procedure Laterality Date  . Renal artery stent    . Abdominal hysterectomy  08/27/2010  . Cholecystectomy  01/11/2013    Procedure: LAPAROSCOPIC CHOLECYSTECTOMY;  Surgeon: Shelly Rubenstein, MD;  Location: [facility];  Service: General;  Laterality: N/A;  Laparoscopic cholecystectomy  . Eye surgery Left     at 6mos old    There were no vitals filed for this visit.      Subjective Assessment - 05/08/16 1153    Subjective Pt reports she is able to do a little more housework, tried sweeling and mopping which made her knee a little sore   Limitations Walking   How long can you stand comfortably? 15-20 min   How long can you walk comfortably? 15 min   Diagnostic tests Xray/MRI /CT scan: Negative, bruising /swelloing.    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    Currently in Pain? Yes   Pain Score 5    Pain Location Knee   Pain Orientation Right   Pain Descriptors / Indicators Aching;Sore   Pain Type Acute pain                         [facility] Adult PT Treatment/Exercise - 05/08/16 0001    Knee/Hip Exercises: Aerobic   Nustep 5 min LE only L4   Knee/Hip Exercises: Standing   Heel Raises Limitations heel and toe raises x 5 then walk with heels and toes 12 feet x 2 each   Knee/Hip Exercises: Seated   Long Arc Quad 15 reps;Right;Strengthening   Long Arc Quad Weight 4 lbs.   Knee/Hip Exercises: Supine   Quad Sets Right;15 reps   Straight Leg Raises Strengthening;Right;15 reps   Knee/Hip Exercises: Sidelying   Hip ABduction Strengthening;Right;10 reps;2 sets   Knee/Hip Exercises: Prone   Hamstring Curl 20 reps   Hamstring Curl Limitations 3 pounds   Hip Extension Strengthening;Right;20 reps   Hip Extension Limitations 3 pounds   Modalities   Modalities Cryotherapy   Cryotherapy   Number Minutes Cryotherapy 10 Minutes   Cryotherapy Location Knee   Type of Cryotherapy Ice pack   Manual Therapy   Manual Therapy  Taping   McConnell patellar fat pad support                PT Education - 05/08/16 1249    Education provided Yes   Education Details exercise form/rationale, gait pattern   Person(s) Educated Patient   Methods Explanation;Demonstration;Tactile cues;Verbal cues   Comprehension Verbalized understanding;Returned demonstration;Verbal cues required;Tactile cues required;Need further instruction          PT Short Term Goals - 04/29/16 1330    PT SHORT TERM GOAL #1   Title she will be independent with initial HEP   Time 4   Period Weeks   Status New   PT SHORT TERM GOAL #2   Title She will report pain decr 50% or more    Time 4   Period Weeks   Status New   PT SHORT TERM GOAL #3   Title Active RT knee ROM equal to LT knee   Time 4   Period Weeks   Status New   PT SHORT TERM  GOAL #4   Title She will be walking without device in and out of home   Time 4   Period Weeks   Status New           PT Long Term Goals - 04/29/16 1331    PT LONG TERM GOAL #1   Title She will be independent with all HEP issued    Time 8   Period Weeks   Status New   PT LONG TERM GOAL #2   Title She will report intermittant pain RT knee   Time 8   Period Weeks   Status New   PT LONG TERM GOAL #3   Title She will be 30 pounds or more to able to carry for return to work   Time 8   Period Weeks   Status New   PT LONG TERM GOAL #4   Title she will be able to walk up and down steps step over step  with one rail safely   Time 8   Period Weeks   Status New   PT LONG TERM GOAL #5   Title She wil report return to all normal home tasks   Time 8   Period Weeks   Status New               Plan - 05/08/16 1249    Clinical Impression Statement Pt required VC for gait pattern to perform heel-toe gait as well as  arm swing. Pt reproted improved pain and exercise tolerance with taping today   PT Treatment/Interventions Cryotherapy;Neurosurgeon;Therapeutic exercise;Manual techniques;Vasopneumatic Device;Taping;Dry needling;Patient/family education;Passive range of motion;Balance training   Consulted and Agree with Plan of Care Patient      Patient will benefit from skilled therapeutic intervention in order to improve the following deficits and impairments:  Difficulty walking, Pain, Increased muscle spasms, Decreased activity tolerance, Decreased strength, Increased edema  Visit Diagnosis: Stiffness of right knee, not elsewhere classified  Pain in right knee  Difficulty in walking, not elsewhere classified  Localized edema     Problem List Patient Active Problem List   Diagnosis Date Noted  . Insomnia 10/20/2015  . Herniated cervical disc 03/24/2013  . Muscle spasms of head and/or neck 03/05/2013  . Left shoulder pain  03/05/2013  . Migraine headache without aura 03/05/2013    Lemario Chaikin C. Perian Tedder PT, DPT 05/08/2016 12:52 PM   [facility] [address] [place],  [place], [address] Phone: [phone]   Fax:  [phone]  Name: Heather Moon MRN: [id number] Date of Birth: 07-30-1981

## 2016-05-15 ENCOUNTER — Ambulatory Visit: Payer: Medicaid Other

## 2016-05-15 DIAGNOSIS — M25661 Stiffness of right knee, not elsewhere classified: Secondary | ICD-10-CM

## 2016-05-15 DIAGNOSIS — M25561 Pain in right knee: Secondary | ICD-10-CM

## 2016-05-15 DIAGNOSIS — R262 Difficulty in walking, not elsewhere classified: Secondary | ICD-10-CM

## 2016-05-15 NOTE — Therapy (Signed)
[place], [place], [address] Phone: [phone]   Fax:  [phone]  Physical Therapy Treatment  Patient Details  Name: Heather Moon MRN: [id number] Date of Birth: 04/26/1981 Referring Provider: Melrose Nakayama, Md  Encounter Date: 05/15/2016      PT End of Session - 05/15/16 1348    Visit Number 4   Number of Visits 16   Date for PT Re-Evaluation 06/24/16   PT Start Time 0539   PT Stop Time 1233   PT Time Calculation (min) 51 min   Activity Tolerance Patient tolerated treatment well   Behavior During Therapy [facility] for tasks assessed/performed      Past Medical History  Diagnosis Date  . Migraine   . Blood transfusion without reported diagnosis   . GERD (gastroesophageal reflux disease)     takes Prilosec as needed  . Cholelithiasis   . Constipation, chronic   . 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 Migraine headache   . Chronic insomnia   . Kidney infection   . UTI (lower urinary tract infection)     Past Surgical History  Procedure Laterality Date  . Renal artery stent    . Abdominal hysterectomy  08/27/2010  . Cholecystectomy  01/11/2013    Procedure: LAPAROSCOPIC CHOLECYSTECTOMY;  Surgeon: Harl Bowie, MD;  Location: [place];  Service: General;  Laterality: N/A;  Laparoscopic cholecystectomy  . Eye surgery Left     at 29mo old    There were no vitals filed for this visit.      Subjective Assessment - 05/15/16 1159    Subjective Doing better . Was able tohis weekend to get out and relax and be in pool with children.    Currently in Pain? Yes   Pain Score 2    Pain Location Knee   Pain Orientation Right   Pain Descriptors / Indicators Aching   Pain Type --  sub acute   Pain Onset More than a month ago   Pain Frequency Intermittent   Multiple Pain Sites No            [facility] PT Assessment - 05/15/16 1206    Observation/Other  Assessments-Edema    Edema Circumferential   Circumferential Edema   Circumferential - Right 24 cm   Circumferential - Left  23 cm at joint line   Posture/Postural Control   Posture Comments RT knee postures in supine with 5-8 degrees flexion  but we were able to get full extension  passive and to 0 degrees with quad sets   AROM   Right Knee Extension 0   Right Knee Flexion 145                     [facility] Adult PT Treatment/Exercise - 05/15/16 1206    Knee/Hip Exercises: Standing   Forward Lunges 10 reps;Right;Left   Lateral Step Up Right;Step Height: 6";15 reps   Forward Step Up Right;15 reps;Step Height: 6";Hand Hold: 1   Functional Squat 10 reps   Functional Squat Limitations She does not flex RT knee as much as left with squat so we reviewed and instructer pt on how to work on strength with squatting but only lower as far as she can go without altering form good technique   Other Standing Knee Exercises Side slides LT and RT 10 feet with cues to keep hips closer to mat  3x  Supine QS with roll under ankle for extension stretching.    Lunge static RT and LTX 12            PT Education - 05/15/16 1347    Education provided Yes   Education Details Step ups and squatting and sid slides in short squatting positon, declined handout   Person(s) Educated Patient   Methods Explanation;Demonstration;Tactile cues;Verbal cues   Comprehension Returned demonstration;Verbalized understanding          PT Short Term Goals - 05/15/16 1350    PT SHORT TERM GOAL #1   Title she will be independent with initial HEP   Status Achieved   PT SHORT TERM GOAL #2   Title She will report pain decr 50% or more    Status Achieved   PT SHORT TERM GOAL #3   Title Active RT knee ROM equal to LT knee   Status Partially Met   PT SHORT TERM GOAL #4   Title She will be walking without device in and out of home   Status Achieved           PT Long Term Goals - 04/29/16 1331     PT LONG TERM GOAL #1   Title She will be independent with all HEP issued    Time 8   Period Weeks   Status New   PT LONG TERM GOAL #2   Title She will report intermittant pain RT knee   Time 8   Period Weeks   Status New   PT LONG TERM GOAL #3   Title She will be 30 pounds or more to able to carry for return to work   Time 8   Period Weeks   Status New   PT LONG TERM GOAL #4   Title she will be able to walk up and down steps step over step  with one rail safely   Time 8   Period Weeks   Status New   PT LONG TERM GOAL #5   Title She wil report return to all normal home tasks   Time 8   Period Weeks   Status New               Plan - 05/15/16 1348    Clinical Impression Statement She is much improved with essentially normal gait pattern ad minimal pain , normal motion passively and posture of RT knee still flexed but she is able to extend fully with end range pain.    PT Next Visit Plan Continue strength closed chain and ROM exension active  work on lifting and carry   PT Home Exercise Plan closed chain  strength   Consulted and Agree with Plan of Care Patient      Patient will benefit from skilled therapeutic intervention in order to improve the following deficits and impairments:  Difficulty walking, Pain, Increased muscle spasms, Decreased activity tolerance, Decreased strength, Increased edema  Visit Diagnosis: Stiffness of right knee, not elsewhere classified  Pain in right knee  Difficulty in walking, not elsewhere classified     Problem List Patient Active Problem List   Diagnosis Date Noted  . Insomnia 10/20/2015  . Herniated cervical disc 03/24/2013  . Muscle spasms of head and/or neck 03/05/2013  . Left shoulder pain 03/05/2013  . Migraine headache without aura 03/05/2013    Darrel Hoover  PT 05/15/2016, 1:52 PM  [facility] [address] [place], [place], [address] Phone: [phone]  Fax:  [phone]  Name: Heather Moon MRN: [id number] Date of Birth: [birth year]

## 2016-05-17 ENCOUNTER — Ambulatory Visit: Payer: Medicaid Other | Attending: Orthopaedic Surgery

## 2016-05-17 DIAGNOSIS — M25561 Pain in right knee: Secondary | ICD-10-CM

## 2016-05-17 DIAGNOSIS — R262 Difficulty in walking, not elsewhere classified: Secondary | ICD-10-CM | POA: Diagnosis present

## 2016-05-17 DIAGNOSIS — M25661 Stiffness of right knee, not elsewhere classified: Secondary | ICD-10-CM | POA: Diagnosis present

## 2016-05-17 DIAGNOSIS — R600 Localized edema: Secondary | ICD-10-CM | POA: Insufficient documentation

## 2016-05-17 NOTE — Patient Instructions (Signed)
She was asked to not do squats over weekend to rest knee and resume next week with caution to have equal weight to  RT leg

## 2016-05-17 NOTE — Therapy (Signed)
[place], [place], [address] Phone: [phone]   Fax:  [phone]  Physical Therapy Treatment  Patient Details  Name: Heather Moon MRN: [id number] Date of Birth: 1981-02-06 Referring Provider: Melrose Nakayama, Md  Encounter Date: 05/17/2016      PT End of Session - 05/17/16 1242    Visit Number 6   Number of Visits 16   Date for PT Re-Evaluation 06/24/16   PT Start Time 5885   PT Stop Time 1100   PT Time Calculation (min) 45 min   Activity Tolerance Patient tolerated treatment well   Behavior During Therapy [facility] for tasks assessed/performed      Past Medical History  Diagnosis Date  . Migraine   . Blood transfusion without reported diagnosis   . GERD (gastroesophageal reflux disease)     takes Prilosec as needed  . Cholelithiasis   . Constipation, chronic   . 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 Migraine headache   . Chronic insomnia   . Kidney infection   . UTI (lower urinary tract infection)     Past Surgical History  Procedure Laterality Date  . Renal artery stent    . Abdominal hysterectomy  08/27/2010  . Cholecystectomy  01/11/2013    Procedure: LAPAROSCOPIC CHOLECYSTECTOMY;  Surgeon: Harl Bowie, MD;  Location: [place];  Service: General;  Laterality: N/A;  Laparoscopic cholecystectomy  . Eye surgery Left     at 1mo old    There were no vitals filed for this visit.      Subjective Assessment - 05/17/16 1022    Subjective Knee all little irritated from exercise with squatting.    Currently in Pain? Yes   Pain Score 3    Pain Location Knee   Pain Orientation Right   Pain Descriptors / Indicators Aching   Pain Type --  sub acute   Pain Onset More than a month ago   Pain Frequency Intermittent   Multiple Pain Sites No                         [facility] Adult PT Treatment/Exercise - 05/17/16 1024    Therapeutic Activites     Therapeutic Activities Lifting   Lifting progressive lifting knee to wais t with 60 feet x 5 reps   Knee/Hip Exercises: Aerobic   Nustep 6 min LE only L6   Knee/Hip Exercises: Standing   Lateral Step Up Right;15 reps  12 inch step   Knee/Hip Exercises: Supine   Quad Sets Right;20 reps   Quad Sets Limitations with knee over noodle with heel lift for terminal extension    Straight Leg Raises 15 reps   Other Supine Knee/Hip Exercises TKE x 15 with hip at 90 degrees   Knee/Hip Exercises: Sidelying   Hip ABduction Right;15 reps   Hip ADduction Right;15 reps   Knee/Hip Exercises: Prone   Hamstring Curl 20 reps   Hamstring Curl Limitations 4 pounds   Straight Leg Raises Right;15 reps                  PT Short Term Goals - 05/15/16 1350    PT SHORT TERM GOAL #1   Title she will be independent with initial HEP   Status Achieved   PT SHORT TERM GOAL #2   Title She will report pain decr 50% or more  Status Achieved   PT SHORT TERM GOAL #3   Title Active RT knee ROM equal to LT knee   Status Partially Met   PT SHORT TERM GOAL #4   Title She will be walking without device in and out of home   Status Achieved           PT Long Term Goals - 04/29/16 1331    PT LONG TERM GOAL #1   Title She will be independent with all HEP issued    Time 8   Period Weeks   Status New   PT LONG TERM GOAL #2   Title She will report intermittant pain RT knee   Time 8   Period Weeks   Status New   PT LONG TERM GOAL #3   Title She will be 30 pounds or more to able to carry for return to work   Time 8   Period Weeks   Status New   PT LONG TERM GOAL #4   Title she will be able to walk up and down steps step over step  with one rail safely   Time 8   Period Weeks   Status New   PT LONG TERM GOAL #5   Title She wil report return to all normal home tasks   Time 8   Period Weeks   Status New               Plan - 05/17/16 1022    Clinical Impression Statement  Continued improved with ability to extens full range but stipp postures in flexion . She is still limited in squat with depth . She was able to lift and carry 60 pounds (50 probaly her limit for safety)  So we should practice   and lifting from different heights,   inclines out side and on grass  if able    PT Next Visit Plan Continue strength closed chain and ROM exension active  work on lifting and carry   Consulted and Agree with Plan of Care Patient      Patient will benefit from skilled therapeutic intervention in order to improve the following deficits and impairments:  Difficulty walking, Pain, Increased muscle spasms, Decreased activity tolerance, Decreased strength, Increased edema  Visit Diagnosis: Stiffness of right knee, not elsewhere classified  Pain in right knee  Difficulty in walking, not elsewhere classified     Problem List Patient Active Problem List   Diagnosis Date Noted  . Insomnia 10/20/2015  . Herniated cervical disc 03/24/2013  . Muscle spasms of head and/or neck 03/05/2013  . Left shoulder pain 03/05/2013  . Migraine headache without aura 03/05/2013    Darrel Hoover  PT 05/17/2016, 12:47 PM  [facility] [address] [place], [place], [address] Phone: [phone]   Fax:  [phone]  Name: Heather Moon MRN: [id number] Date of Birth: November 07, 1981

## 2016-05-20 ENCOUNTER — Encounter: Payer: Self-pay | Admitting: Physical Therapy

## 2016-05-21 ENCOUNTER — Ambulatory Visit: Payer: Medicaid Other | Admitting: Physical Therapy

## 2016-05-21 DIAGNOSIS — M25561 Pain in right knee: Secondary | ICD-10-CM

## 2016-05-21 DIAGNOSIS — M25661 Stiffness of right knee, not elsewhere classified: Secondary | ICD-10-CM

## 2016-05-21 DIAGNOSIS — R262 Difficulty in walking, not elsewhere classified: Secondary | ICD-10-CM

## 2016-05-21 DIAGNOSIS — R600 Localized edema: Secondary | ICD-10-CM

## 2016-05-21 NOTE — Therapy (Signed)
[place], [place], [address] Phone: [phone]   Fax:  [phone]  Physical Therapy Treatment  Patient Details  Name: Heather Moon MRN: [id number] Date of Birth: 10-Dec-1981 Referring Provider: Melrose Nakayama, Md  Encounter Date: 05/21/2016      PT End of Session - 05/21/16 1135    Visit Number 7   Number of Visits 16   Date for PT Re-Evaluation 06/24/16   Authorization Type Medpay   PT Start Time 1100   PT Stop Time 1140   PT Time Calculation (min) 40 min   Activity Tolerance Patient tolerated treatment well      Past Medical History  Diagnosis Date  . Migraine   . Blood transfusion without reported diagnosis   . GERD (gastroesophageal reflux disease)     takes Prilosec as needed  . Cholelithiasis   . Constipation, chronic   . 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 Migraine headache   . Chronic insomnia   . Kidney infection   . UTI (lower urinary tract infection)     Past Surgical History  Procedure Laterality Date  . Renal artery stent    . Abdominal hysterectomy  08/27/2010  . Cholecystectomy  01/11/2013    Procedure: LAPAROSCOPIC CHOLECYSTECTOMY;  Surgeon: Harl Bowie, MD;  Location: [place];  Service: General;  Laterality: N/A;  Laparoscopic cholecystectomy  . Eye surgery Left     at 13mo old    There were no vitals filed for this visit.      Subjective Assessment - 05/21/16 1108    Subjective Patient continues to report some soreness with squatting. She feels like overall it is getting a little better.    Limitations Walking   How long can you stand comfortably? 15-20 min   How long can you walk comfortably? 15 min   Diagnostic tests Xray/MRI /CT scan: Negative, bruising /swelling.    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    Currently in Pain? Yes   Pain Score 3    Pain Location Knee   Pain Orientation Right   Pain Descriptors / Indicators Aching   Pain Onset More than a month ago   Pain Frequency Intermittent   Aggravating Factors  moving and weight bearing    Pain Relieving Factors elevate ice and heat                          [facility] Adult PT Treatment/Exercise - 05/21/16 0001    Therapeutic Activites    Therapeutic Activities Lifting   Lifting progressive lifting knee to wais t with 60 feet x 5 reps; Box lift 20 lbs form elevated surface 20x    Knee/Hip Exercises: Aerobic   Nustep 6 min LE only L6   Knee/Hip Exercises: Machines for Strengthening   Total Gym Leg Press 60 lns 2x10    Knee/Hip Exercises: Standing   Lateral Step Up Right;20 reps  12 inch step   Step Down Limitations 20x from 12 inch step    Functional Squat 3 sets   Functional Squat Limitations 2 sets with red band around her legs for cuing    Knee/Hip Exercises: Supine   Straight Leg Raises 20 reps   Straight Leg Raises Limitations 2lbs   Other Supine Knee/Hip Exercises TKE x 15 with hip at 90 degrees  Knee/Hip Exercises: Sidelying   Hip ABduction Right;20 reps   Hip ADduction Right;20 reps   Knee/Hip Exercises: Prone   Hamstring Curl 20 reps   Hamstring Curl Limitations 4 pounds   Modalities   Modalities Cryotherapy   Cryotherapy   Number Minutes Cryotherapy 10 Minutes   Cryotherapy Location Knee   Type of Cryotherapy Ice pack                PT Education - 05/21/16 1112    Education Details continu with HEP    Person(s) Educated Patient   Methods Explanation;Demonstration;Tactile cues   Comprehension Verbalized understanding;Returned demonstration          PT Short Term Goals - 05/15/16 1350    PT SHORT TERM GOAL #1   Title she will be independent with initial HEP   Status Achieved   PT SHORT TERM GOAL #2   Title She will report pain decr 50% or more    Status Achieved   PT SHORT TERM GOAL #3   Title Active RT knee ROM equal to LT knee   Status  Partially Met   PT SHORT TERM GOAL #4   Title She will be walking without device in and out of home   Status Achieved           PT Long Term Goals - 04/29/16 1331    PT LONG TERM GOAL #1   Title She will be independent with all HEP issued    Time 8   Period Weeks   Status New   PT LONG TERM GOAL #2   Title She will report intermittant pain RT knee   Time 8   Period Weeks   Status New   PT LONG TERM GOAL #3   Title She will be 30 pounds or more to able to carry for return to work   Time 8   Period Weeks   Status New   PT LONG TERM GOAL #4   Title she will be able to walk up and down steps step over step  with one rail safely   Time 8   Period Weeks   Status New   PT LONG TERM GOAL #5   Title She wil report return to all normal home tasks   Time 8   Period Weeks   Status New               Plan - 05/21/16 1136    Clinical Impression Statement Patient required some cuing for a proper squat. Therapy had her perform a squat with a nband around her legs which improved her valgus with her squat. She was able to carry the 60 lb box again without much pain.    Rehab Potential Good   PT Frequency 2x / week   PT Duration 8 weeks   PT Treatment/Interventions Cryotherapy;Pharmacist, hospital;Therapeutic exercise;Manual techniques;Vasopneumatic Device;Taping;Dry needling;Patient/family education;Passive range of motion;Balance training   PT Next Visit Plan Continue strength closed chain and ROM exension active  work on lifting and carry   PT Home Exercise Plan closed chain  strength   Consulted and Agree with Plan of Care Patient      Patient will benefit from skilled therapeutic intervention in order to improve the following deficits and impairments:  Difficulty walking, Pain, Increased muscle spasms, Decreased activity tolerance, Decreased strength, Increased edema  Visit Diagnosis: Stiffness of right knee, not elsewhere classified  Pain in  right knee  Difficulty in walking, not elsewhere classified  Localized edema     Problem List Patient Active Problem List   Diagnosis Date Noted  . Insomnia 10/20/2015  . Herniated cervical disc 03/24/2013  . Muscle spasms of head and/or neck 03/05/2013  . Left shoulder pain 03/05/2013  . Migraine headache without aura 03/05/2013    Carney Living PT DPT  05/21/2016, 1:25 PM  [facility] [address] [place], [place], [address] Phone: [phone]   Fax:  [phone]  Name: Heather Moon MRN: [id number] Date of Birth: [birth year]

## 2016-05-22 ENCOUNTER — Encounter: Payer: Self-pay | Admitting: Physical Therapy

## 2016-05-22 ENCOUNTER — Ambulatory Visit: Payer: Medicaid Other | Admitting: Physical Therapy

## 2016-05-22 DIAGNOSIS — M25561 Pain in right knee: Secondary | ICD-10-CM

## 2016-05-22 DIAGNOSIS — R262 Difficulty in walking, not elsewhere classified: Secondary | ICD-10-CM

## 2016-05-22 DIAGNOSIS — M25661 Stiffness of right knee, not elsewhere classified: Secondary | ICD-10-CM | POA: Diagnosis not present

## 2016-05-22 DIAGNOSIS — R600 Localized edema: Secondary | ICD-10-CM

## 2016-05-22 NOTE — Therapy (Signed)
[place], [place], [address] Phone: [phone]   Fax:  [phone]  Physical Therapy Treatment  Patient Details  Name: Heather Moon MRN: [id number] Date of Birth: Apr 15, 1981 Referring Provider: Melrose Nakayama, Md  Encounter Date: 05/22/2016      PT End of Session - 05/22/16 1151    Visit Number 8   Number of Visits 16   Date for PT Re-Evaluation 06/24/16   Authorization Type Medpay   PT Start Time 1148   PT Stop Time 1237   PT Time Calculation (min) 49 min   Activity Tolerance Patient tolerated treatment well   Behavior During Therapy [facility] for tasks assessed/performed      Past Medical History  Diagnosis Date  . Migraine   . Blood transfusion without reported diagnosis   . GERD (gastroesophageal reflux disease)     takes Prilosec as needed  . Cholelithiasis   . Constipation, chronic   . 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 Migraine headache   . Chronic insomnia   . Kidney infection   . UTI (lower urinary tract infection)     Past Surgical History  Procedure Laterality Date  . Renal artery stent    . Abdominal hysterectomy  08/27/2010  . Cholecystectomy  01/11/2013    Procedure: LAPAROSCOPIC CHOLECYSTECTOMY;  Surgeon: Harl Bowie, MD;  Location: [place];  Service: General;  Laterality: N/A;  Laparoscopic cholecystectomy  . Eye surgery Left     at 52mo old    There were no vitals filed for this visit.      Subjective Assessment - 05/22/16 1149    Subjective about the same as yesterday   Currently in Pain? Yes   Pain Score 2    Aggravating Factors  squatting   Pain Relieving Factors modalities                         [facility] Adult PT Treatment/Exercise - 05/22/16 0001    Knee/Hip Exercises: Stretches   Passive Hamstring Stretch 30 seconds   Passive Hamstring Stretch Limitations seated EOB with green strap   Quad Stretch 2  reps;30 seconds   Quad Stretch Limitations prone with green strap   Gastroc Stretch 2 reps;30 seconds   Knee/Hip Exercises: Aerobic   Nustep 6 min LE only L6   Knee/Hip Exercises: Machines for Strengthening   Total Gym Leg Press 60 lns 2x10    Knee/Hip Exercises: Standing   Side Lunges 15 reps   Side Lunges Limitations moving weight side to side   Hip Extension Both;20 reps   Extension Limitations holding ball in knee flexion   Lateral Step Up Right;20 reps  12 inch step   Step Down Limitations 20x from 12 inch step    Functional Squat Limitations 2 sets with red band around her legs for cuing    Knee/Hip Exercises: Sidelying   Hip ABduction 20 reps;Both   Hip ABduction Limitations with knee flx/ext   Hip ADduction Both;20 reps   Cryotherapy   Number Minutes Cryotherapy 10 Minutes   Cryotherapy Location Knee   Type of Cryotherapy Ice pack                PT Education - 05/22/16 1231    Education provided Yes   Education Details exercise form/rationale, soreness with increased exercises.    Person(s) Educated Patient   Methods Explanation;Demonstration;Tactile  cues;Verbal cues   Comprehension Verbalized understanding;Returned demonstration;Verbal cues required;Tactile cues required;Need further instruction          PT Short Term Goals - 05/15/16 1350    PT SHORT TERM GOAL #1   Title she will be independent with initial HEP   Status Achieved   PT SHORT TERM GOAL #2   Title She will report pain decr 50% or more    Status Achieved   PT SHORT TERM GOAL #3   Title Active RT knee ROM equal to LT knee   Status Partially Met   PT SHORT TERM GOAL #4   Title She will be walking without device in and out of home   Status Achieved           PT Long Term Goals - 04/29/16 1331    PT LONG TERM GOAL #1   Title She will be independent with all HEP issued    Time 8   Period Weeks   Status New   PT LONG TERM GOAL #2   Title She will report intermittant pain RT knee    Time 8   Period Weeks   Status New   PT LONG TERM GOAL #3   Title She will be 30 pounds or more to able to carry for return to work   Time 8   Period Weeks   Status New   PT LONG TERM GOAL #4   Title she will be able to walk up and down steps step over step  with one rail safely   Time 8   Period Weeks   Status New   PT LONG TERM GOAL #5   Title She wil report return to all normal home tasks   Time 8   Period Weeks   Status New               Plan - 05/22/16 1231    Clinical Impression Statement increased challenges in some exercises, cuing required for form. pt reported fatigue but did not complain of increased pain.    PT Next Visit Plan Continue strength closed chain and ROM exension active  work on lifting and carry   PT Home Exercise Plan closed chain  strength, passive HSS seated EOB   Consulted and Agree with Plan of Care Patient      Patient will benefit from skilled therapeutic intervention in order to improve the following deficits and impairments:     Visit Diagnosis: Stiffness of right knee, not elsewhere classified  Pain in right knee  Difficulty in walking, not elsewhere classified  Localized edema     Problem List Patient Active Problem List   Diagnosis Date Noted  . Insomnia 10/20/2015  . Herniated cervical disc 03/24/2013  . Muscle spasms of head and/or neck 03/05/2013  . Left shoulder pain 03/05/2013  . Migraine headache without aura 03/05/2013    Heather Moon PT, DPT 05/22/2016 12:33 PM   [facility] [address] [place], [place], [address] Phone: [phone]   Fax:  [phone]  Name: Heather Moon MRN: [id number] Date of Birth: February 08, 1981

## 2016-05-23 ENCOUNTER — Telehealth: Payer: Self-pay | Admitting: Family Medicine

## 2016-05-23 NOTE — Telephone Encounter (Signed)
If the patient calls we can work her in tomorrow morning for evaluation regarding this and going over this information. In a very kind way please let them know that we are working him into the schedule so therefore we will have 15 minutes to cover this information this is more than adequate to cover this issue

## 2016-05-23 NOTE — Telephone Encounter (Signed)
Patient mom Heather Moon(Emma) called confused about her appointment with you today. She stated to her daughter that her medication is making her confused. And daughter wants to speak with you about this. She wants to know if she needs to keep taking her new medication you prescribe and she is having a reaction to it because its burning her tongue.And states mom had to go ro ER last night because of it.

## 2016-05-23 NOTE — Telephone Encounter (Signed)
I had a thorough conversation with him today. I would recommend in the future family members come with the patient. At our current visit today I felt that the patient was on too many medications that were causing mental confusion. The psychiatrists has her on medicine for her depression. The patient states that the Cymbalta that the psychiatrist prescribed was causing her tongue to burn. The patient was instructed to talk with psychiatry regarding this. I also felt that we could stop a couple of her medicines to lessen the chance of confusion including stopping Topamax and stopping Lyrica. I also recommended that she stop cetirizine in used loratadine in it's place for allergies-this is because cetirizine can cause drowsiness. I also recommended that the patient reduce Xanax from 1 mg 3 times a day gradually reduce it down to 2 tablets daily. I also told her that any of her psychiatric medicines that she did not feel were helping her or causing her problems she needed to talk to Dr. Tenny Crawoss about. Please communicate this to the daughter. I also wrote the patient hand written notes to help her. The patient can follow-up in several weeks if still having ongoing issues

## 2016-05-23 NOTE — Telephone Encounter (Signed)
Daughter is concerned about mother and her meds. Daughter states her mom is very confused and not acting herself. Dr Lorin PicketScott advises that the patient call tomorrow for an office visit and bring meds and papers from today's visit to office visit. Dr Lorin PicketScott also thinks daughter should come with her to address concerns.

## 2016-05-24 NOTE — Telephone Encounter (Signed)
Nurses please print patient's medication list as well as when her next office visit is. This can be mailed to the patient-Heather Moon-or Heather Moon can pick these up

## 2016-05-24 NOTE — Telephone Encounter (Signed)
Pt called back and states she is not confused about anything as far as her meds  She feels Heather Moon is mad with her because Kara Meadmma tries to still smoke now and again She she runs out of the vape stuff. The only thing that Kara Meadmma would like at this time is For us to right out her meds and how to use them as well as her upcoming appts so Kara Meadmma Can come pick them up

## 2016-05-27 ENCOUNTER — Ambulatory Visit: Payer: Medicaid Other | Admitting: Physical Therapy

## 2016-05-27 ENCOUNTER — Encounter: Payer: Self-pay | Admitting: Physical Therapy

## 2016-05-27 DIAGNOSIS — M25661 Stiffness of right knee, not elsewhere classified: Secondary | ICD-10-CM

## 2016-05-27 DIAGNOSIS — M25561 Pain in right knee: Secondary | ICD-10-CM

## 2016-05-27 DIAGNOSIS — R262 Difficulty in walking, not elsewhere classified: Secondary | ICD-10-CM

## 2016-05-27 NOTE — Therapy (Signed)
[place], [place], [address] Phone: [phone]   Fax:  [phone]  Physical Therapy Treatment  Patient Details  Name: Heather Moon MRN: [id number] Date of Birth: Apr 27, 1981 Referring Provider: Melrose Nakayama, Md  Encounter Date: 05/27/2016      PT End of Session - 05/27/16 1205    Visit Number 9   Number of Visits 16   Date for PT Re-Evaluation 06/24/16   Authorization Type Medpay   PT Start Time 1147   PT Stop Time 1238   PT Time Calculation (min) 51 min   Activity Tolerance Patient tolerated treatment well   Behavior During Therapy [facility] for tasks assessed/performed      Past Medical History  Diagnosis Date  . Migraine   . Blood transfusion without reported diagnosis   . GERD (gastroesophageal reflux disease)     takes Prilosec as needed  . Cholelithiasis   . Constipation, chronic   . 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 Migraine headache   . Chronic insomnia   . Kidney infection   . UTI (lower urinary tract infection)     Past Surgical History  Procedure Laterality Date  . Renal artery stent    . Abdominal hysterectomy  08/27/2010  . Cholecystectomy  01/11/2013    Procedure: LAPAROSCOPIC CHOLECYSTECTOMY;  Surgeon: Harl Bowie, MD;  Location: [facility];  Service: General;  Laterality: N/A;  Laparoscopic cholecystectomy  . Eye surgery Left     at 47mo old    There were no vitals filed for this visit.      Subjective Assessment - 05/27/16 1149    Subjective continues to feel the most discomfort where she is still numb. has started getting random sharp pains, burning and cramping around numb area, hips were sore after last visit   Currently in Pain? Yes   Pain Score 3    Pain Location Knee   Pain Descriptors / Indicators Burning   Aggravating Factors  slight tightness on anterior knee with squatting, numbness area on knee is the only thing  bothering her                         [facility] Adult PT Treatment/Exercise - 05/27/16 0001    Knee/Hip Exercises: Stretches   Passive Hamstring Stretch 30 seconds   Passive Hamstring Stretch Limitations seated EOB with green strap   Quad Stretch 30 seconds   Quad Stretch Limitations prone with green strap   Gastroc Stretch 2 reps;30 seconds   Knee/Hip Exercises: Aerobic   Nustep 6 min LE only L6   Knee/Hip Exercises: Standing   Hip Extension (p) Both;20 reps   Extension Limitations (p) holding ball in knee flexion   Step Down Limitations (p) 20x from 12 inch step    Functional Squat Limitations (p) 2 sets with red band around her legs for cuing    SLS (p) 1 min ea on dynadisk   Knee/Hip Exercises: Supine   Single Leg Bridge (p) 2 sets;10 reps  with ball squeeze   Knee/Hip Exercises: Sidelying   Hip ABduction 20 reps   Hip ABduction Limitations with knee flx/ext   Hip ADduction (p) Both;20 reps   Cryotherapy   Number Minutes Cryotherapy (p) 10 Minutes   Cryotherapy Location (p) Knee   Type of Cryotherapy (p) Ice pack  PT Education - 05/27/16 1204    Education Details exercise form/rationale, POC   Person(s) Educated Patient   Methods Explanation;Demonstration;Tactile cues;Verbal cues   Comprehension Verbalized understanding;Returned demonstration;Verbal cues required;Tactile cues required;Need further instruction          PT Short Term Goals - 05/15/16 1350    PT SHORT TERM GOAL #1   Title she will be independent with initial HEP   Status Achieved   PT SHORT TERM GOAL #2   Title She will report pain decr 50% or more    Status Achieved   PT SHORT TERM GOAL #3   Title Active RT knee ROM equal to LT knee   Status Partially Met   PT SHORT TERM GOAL #4   Title She will be walking without device in and out of home   Status Achieved           PT Long Term Goals - 04/29/16 1331    PT LONG TERM GOAL #1   Title She will be  independent with all HEP issued    Time 8   Period Weeks   Status New   PT LONG TERM GOAL #2   Title She will report intermittant pain RT knee   Time 8   Period Weeks   Status New   PT LONG TERM GOAL #3   Title She will be 30 pounds or more to able to carry for return to work   Time 8   Period Weeks   Status New   PT LONG TERM GOAL #4   Title she will be able to walk up and down steps step over step  with one rail safely   Time 8   Period Weeks   Status New   PT LONG TERM GOAL #5   Title She wil report return to all normal home tasks   Time 8   Period Weeks   Status New               Plan - 05/27/16 1205    Clinical Impression Statement good form and minimal cuing for exercise form. discussed progression of sensory return. will be d/c at next visit   PT Next Visit Plan d/c to HEP   Consulted and Agree with Plan of Care Patient      Patient will benefit from skilled therapeutic intervention in order to improve the following deficits and impairments:     Visit Diagnosis: Stiffness of right knee, not elsewhere classified  Pain in right knee  Difficulty in walking, not elsewhere classified     Problem List Patient Active Problem List   Diagnosis Date Noted  . Insomnia 10/20/2015  . Herniated cervical disc 03/24/2013  . Muscle spasms of head and/or neck 03/05/2013  . Left shoulder pain 03/05/2013  . Migraine headache without aura 03/05/2013    Alfred Harrel C. Jw Covin PT, DPT 05/27/2016 1:49 PM   [facility] [address] [place], [place], [address] Phone: [phone]   Fax:  [phone]  Name: Heather Moon MRN: [id number] Date of Birth: 1981/01/27

## 2016-05-27 NOTE — Telephone Encounter (Signed)
Discussed with Kara MeadEmma. Kara Meadmma wants med list and appt list mailed to her. Done.

## 2016-05-29 ENCOUNTER — Ambulatory Visit: Payer: Medicaid Other

## 2016-05-29 DIAGNOSIS — R262 Difficulty in walking, not elsewhere classified: Secondary | ICD-10-CM

## 2016-05-29 DIAGNOSIS — M25661 Stiffness of right knee, not elsewhere classified: Secondary | ICD-10-CM

## 2016-05-29 DIAGNOSIS — M25561 Pain in right knee: Secondary | ICD-10-CM

## 2016-05-29 NOTE — Patient Instructions (Signed)
Instructed pt to continue HEP , ice as needed for pain and that symptoms should resolve over time as her function has returned so well/

## 2016-05-29 NOTE — Therapy (Signed)
[facility] [address] [place], [place], [address] Phone: [phone]   Fax:  [phone]  Physical Therapy Treatment/ Discharge  Patient Details  Name: Heather Moon MRN: [id number] Date of Birth: December 25, 1980 Referring Provider: Marcene CorningPeter Dalldorf, Md  Encounter Date: 05/29/2016      PT End of Session - 05/29/16 1145    Visit Number 10   Number of Visits 10   PT Start Time 1140   PT Stop Time 1215   PT Time Calculation (min) 35 min   Activity Tolerance Patient tolerated treatment well   Behavior [facility] for tasks assessed/performed      Past Medical History  Diagnosis Date  . Migraine   . Blood transfusion without reported diagnosis   . GERD (gastroesophageal reflux disease)     takes Prilosec as needed  . Cholelithiasis   . Constipation, chronic   . 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 Migraine headache   . Chronic insomnia   . Kidney infection   . UTI (lower urinary tract infection)     Past Surgical History  Procedure Laterality Date  . Renal artery stent    . Abdominal hysterectomy  08/27/2010  . Cholecystectomy  01/11/2013    Procedure: LAPAROSCOPIC CHOLECYSTECTOMY;  Surgeon: Shelly Rubensteinouglas A Blackman, MD;  Location: [facility];  Service: General;  Laterality: N/A;  Laparoscopic cholecystectomy  . Eye surgery Left     at 6mos old    There were no vitals filed for this visit.      Subjective Assessment - 05/29/16 1220    Subjective Continues to report numbness and burning anterior RT knee and cramping feeling at times but functionally she is independent  and appears capable of working carrying needed load (50 pounds) for work.    Currently in Pain? Yes   Pain Score 2    Pain Location Knee   Pain Orientation Right   Pain Descriptors / Indicators Burning   Pain Type --  sub acute   Pain Onset More than a month ago   Pain Frequency Intermittent   Aggravating Factors   mopping , heavier home tasks   Pain Relieving Factors cold , stretching   Multiple Pain Sites No            [facility] PT Assessment - 05/29/16 0001    AROM   Right Knee Extension 0   Right Knee Flexion 150   Strength   Overall Strength Comments 5-/5 hip      Full squat with bouncing at end range without increased  pain                [facility] Adult PT Treatment/Exercise - 05/29/16 1217    Therapeutic Activites    Therapeutic Activities Lifting;Work Counselling psychologistimulation   Work Higher education careers adviserimulation lifting and carryinng up to 55 pounds 100 feet x 5 and able to walk 12 steps up nad down with with load  without pain   Knee/Hip Exercises: Aerobic   Nustep 6 min LE only L6   Knee/Hip Exercises: Standing   Functional Squat 5 reps   Functional Squat Limitations full ROM without pain with end range bouncing                  PT Short Term Goals - 05/29/16 1147    PT SHORT TERM GOAL #1   Title she will be independent with initial HEP   Status Achieved  PT SHORT TERM GOAL #2   Title She will report pain decr 50% or more    Status Achieved   PT SHORT TERM GOAL #3   Title Active RT knee ROM equal to LT knee   Status Achieved   PT SHORT TERM GOAL #4   Title She will be walking without device in and out of home   Status Achieved           PT Long Term Goals - 05/29/16 1147    PT LONG TERM GOAL #1   Title She will be independent with all HEP issued    Status Achieved   PT LONG TERM GOAL #2   Title She will report intermittant pain RT knee   Status Achieved   PT LONG TERM GOAL #3   Title She will be 30 pounds or more to able to carry for return to work   Status Achieved   PT LONG TERM GOAL #4   Title she will be able to walk up and down steps step over step  with one rail safely   Baseline carrying 50 pounds   Status Achieved   PT LONG TERM GOAL #5   Title She wil report return to all normal home tasks   Status Achieved               Plan - 05/29/16 1145     Clinical Impression Statement Doing well  ready for discharge. continue with symptoms anterior RT knee but not limiting function   PT Next Visit Plan d/c to HEP today   Consulted and Agree with Plan of Care Patient      Patient will benefit from skilled therapeutic intervention in order to improve the following deficits and impairments:  Difficulty walking, Pain, Increased muscle spasms, Decreased activity tolerance, Decreased strength, Increased edema  Visit Diagnosis: Stiffness of right knee, not elsewhere classified  Pain in right knee  Difficulty in walking, not elsewhere classified     Problem List Patient Active Problem List   Diagnosis Date Noted  . Insomnia 10/20/2015  . Herniated cervical disc 03/24/2013  . Muscle spasms of head and/or neck 03/05/2013  . Left shoulder pain 03/05/2013  . Migraine headache without aura 03/05/2013    Caprice Red  PT 05/29/2016, 12:26 PM  [facility] [address] [place], [place], [address] Phone: [phone]   Fax:  [phone]  Name: Heather Moon MRN: [id number] Date of Birth: Aug 13, 1981

## 2016-07-28 ENCOUNTER — Other Ambulatory Visit: Payer: Self-pay | Admitting: Family Medicine

## 2016-07-29 ENCOUNTER — Telehealth: Payer: Self-pay | Admitting: Family Medicine

## 2016-07-29 NOTE — Telephone Encounter (Signed)
Pt is wanting to know if you are going to refill the [employer]   [facility] [address]

## 2016-07-29 NOTE — Telephone Encounter (Signed)
Patient notified script will be faxed to pharmacy today.  

## 2016-07-29 NOTE — Telephone Encounter (Signed)
May have 2 

## 2016-09-05 ENCOUNTER — Encounter: Payer: Medicaid Other | Admitting: Nurse Practitioner

## 2016-09-08 ENCOUNTER — Emergency Department (HOSPITAL_COMMUNITY)
Admission: EM | Admit: 2016-09-08 | Discharge: 2016-09-08 | Disposition: A | Discharge status: Home / Self Care | Payer: Medicaid Other | Attending: Emergency Medicine | Admitting: Emergency Medicine

## 2016-09-08 ENCOUNTER — Encounter (HOSPITAL_COMMUNITY): Payer: Self-pay

## 2016-09-08 DIAGNOSIS — N390 Urinary tract infection, site not specified: Secondary | ICD-10-CM

## 2016-09-08 DIAGNOSIS — M545 Low back pain, unspecified: Secondary | ICD-10-CM

## 2016-09-08 DIAGNOSIS — M549 Dorsalgia, unspecified: Secondary | ICD-10-CM | POA: Diagnosis present

## 2016-09-08 LAB — URINALYSIS, ROUTINE W REFLEX MICROSCOPIC
Bilirubin Urine: NEGATIVE
GLUCOSE, UA: NEGATIVE mg/dL
KETONES UR: NEGATIVE mg/dL
Nitrite: POSITIVE — AB
PROTEIN: NEGATIVE mg/dL
Specific Gravity, Urine: 1.015 (ref 1.005–1.030)
pH: 5.5 (ref 5.0–8.0)

## 2016-09-08 LAB — URINE MICROSCOPIC-ADD ON: RBC / HPF: NONE SEEN RBC/hpf (ref 0–5)

## 2016-09-08 MED ORDER — MORPHINE SULFATE (PF) 4 MG/ML IV SOLN
6.0000 mg | Freq: Once | INTRAVENOUS | Status: AC
  Administered 2016-09-08: 6 mg via INTRAMUSCULAR
  Filled 2016-09-08: qty 2

## 2016-09-08 MED ORDER — CEPHALEXIN 500 MG PO CAPS: 500.0000 mg | ORAL_CAPSULE | Freq: Four times a day (QID) | ORAL | 0 refills | Status: DC

## 2016-09-08 MED ORDER — DIAZEPAM 5 MG PO TABS
5.0000 mg | ORAL_TABLET | Freq: Once | ORAL | Status: AC
  Administered 2016-09-08: 5 mg via ORAL
  Filled 2016-09-08: qty 1

## 2016-09-08 NOTE — ED Triage Notes (Signed)
Pt reports stabbing left lower back pain with dark, foul smelling urine x 4 days. She reports hx of bladder infections and hx of surgery to kidneys. She states this back pain feels different because its only painful when she moves around and she cant lay flat on her back.

## 2016-09-08 NOTE — ED Notes (Signed)
MD at bedside. 

## 2016-09-08 NOTE — ED Provider Notes (Signed)
[facility]ENCY DEPT Provider Note   CSN: [id number] Arrival date & time: 09/08/16  1409     History   Chief Complaint Chief Complaint  Patient presents with  . Back Pain    HPI Heather Moon is a 35 y.o. female.  35 y/o female w/ 3 days of left flank pain--pain is sharp and radiates to left leg No rashes H/o uti's and she is concerned she may have another one. 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 Denies bowel or bladder dysfunction. No notes or tingling to her feet or toes. Pain is worse with movement better with rest. Has history of chronic neck pain and take chronic opiates for this. Did take a dose of her Percocet prior to arrival without relief.      Past Medical History:  Diagnosis Date  . Blood transfusion without reported diagnosis   . Cholelithiasis   . Chronic insomnia   . Constipation, chronic   . GERD (gastroesophageal reflux disease)    takes Prilosec as needed  . Kidney infection   . Migraine   . Migraine headache   . 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 UTI (lower urinary tract infection)     Patient Active Problem List   Diagnosis Date Noted  . Insomnia 10/20/2015  . Herniated cervical disc 03/24/2013  . Muscle spasms of head and/or neck 03/05/2013  . Left shoulder pain 03/05/2013  . Migraine headache without aura 03/05/2013    Past Surgical History:  Procedure Laterality Date  . ABDOMINAL HYSTERECTOMY  08/27/2010  . CHOLECYSTECTOMY  01/11/2013   Procedure: LAPAROSCOPIC CHOLECYSTECTOMY;  Surgeon: Shelly Rubenstein, MD;  Location: [facility];  Service: General;  Laterality: N/A;  Laparoscopic cholecystectomy  . EYE SURGERY Left    at 6mos old  . RENAL ARTERY STENT      OB History    No data available       Home Medications    Prior to Admission medications   Medication Sig Start Date End Date Taking? Authorizing Provider  acyclovir (ZOVIRAX) 400 MG  tablet TAKE 1 TABLET BY MOUTH 4 TIMES A DAY FOR 5 DAYS Patient not taking: Reported on 04/29/2016 03/15/16   [name], MD  cyclobenzaprine (FLEXERIL) 10 MG tablet Take 1 tablet (10 mg total) by mouth 3 (three) times daily as needed for muscle spasms (or pain). 04/05/16   [name], PA-C  ibuprofen (ADVIL,MOTRIN) 800 MG tablet Take 1 tablet (800 mg total) by mouth every 8 (eight) hours as needed for mild pain or moderate pain. 04/05/16   [name], PA-C  ipratropium (ATROVENT) 0.06 % nasal spray Place 2 sprays into both nostrils 4 (four) times daily. Patient not taking: Reported on 04/29/2016 10/28/14   [name], MD  lidocaine (LIDODERM) 5 % Place 1 patch onto the skin daily. Remove & Discard patch within 12 hours or as directed by MD Patient not taking: Reported on 04/29/2016 01/13/16   [name], PA-C  methocarbamol (ROBAXIN) 500 MG tablet Take 1 tablet (500 mg total) by mouth 2 (two) times daily. Patient not taking: Reported on 04/29/2016 06/18/15   [name], PA-C  oseltamivir (TAMIFLU) 75 MG capsule Take 1 capsule (75 mg total) by mouth 2 (two) times daily. Patient not taking: Reported on 04/29/2016 03/12/16   [name], MD  oxyCODONE (ROXICODONE) 5 MG immediate release tablet Take 1 tablet (5  mg total) by mouth every 4 (four) hours as needed for severe pain. Patient not taking: Reported on 04/29/2016 04/07/16   [name], NP  oxyCODONE-acetaminophen (PERCOCET) 10-325 MG tablet Take 1 tablet by mouth every 4 (four) hours as needed for pain. Reported on 05/08/2016    Historical Provider, MD  oxyCODONE-acetaminophen (PERCOCET) 7.5-325 MG tablet Take 1 tablet by mouth every 4 (four) hours as needed for severe pain.    Historical Provider, MD  zolpidem (AMBIEN) 10 MG tablet TAKE 1 TABLET AT BEDTIME AS NEEDED FOR SLEEP 07/29/16   Babs SciaraScott A Luking, MD    Family History Family History  Problem Relation Age of Onset  . Cancer Maternal Grandmother   . Cirrhosis Maternal Grandfather      Social History Social History  Substance Use Topics  . Smoking status: Never Smoker  . Smokeless tobacco: Never Used  . Alcohol use No     Allergies   Doxycycline; Hydrocodone; Macrolides and ketolides; and Trazodone and nefazodone   Review of Systems Review of Systems  All other systems reviewed and are negative.    Physical Exam Updated Vital Signs BP 119/76 (BP Location: Right Arm)   Pulse (!) 48   Temp 98.2 F (36.8 C) (Oral)   Resp 18   SpO2 100%   Physical Exam  Constitutional: She is oriented to person, place, and time. She appears well-developed and well-nourished.  Non-toxic appearance. No distress.  HENT:  Head: Normocephalic and atraumatic.  Eyes: Conjunctivae, EOM and lids are normal. Pupils are equal, round, and reactive to light.  Neck: Normal range of motion. Neck supple. No tracheal deviation present. No thyroid mass present.  Cardiovascular: Normal rate, regular rhythm and normal heart sounds.  Exam reveals no gallop.   No murmur heard. Pulmonary/Chest: Effort normal and breath sounds normal. No stridor. No respiratory distress. She has no decreased breath sounds. She has no wheezes. She has no rhonchi. She has no rales.  Abdominal: Soft. Normal appearance and bowel sounds are normal. She exhibits no distension. There is no tenderness. There is no rebound and no CVA tenderness.  Musculoskeletal: Normal range of motion. She exhibits no edema or tenderness.       Back:  Neurological: She is alert and oriented to person, place, and time. She has normal strength. No cranial nerve deficit or sensory deficit. GCS eye subscore is 4. GCS verbal subscore is 5. GCS motor subscore is 6.  Reflex Scores:      Patellar reflexes are 3+ on the right side and 3+ on the left side. Skin: Skin is warm and dry. No abrasion and no rash noted.  Psychiatric: She has a normal mood and affect. Her speech is normal and behavior is normal.  Nursing note and vitals  reviewed.    ED Treatments / Results  Labs (all labs ordered are listed, but only abnormal results are displayed) Labs Reviewed  URINALYSIS, ROUTINE W REFLEX MICROSCOPIC (NOT AT [facility]) - Abnormal; Notable for the following:       Result Value   APPearance CLOUDY (*)    Hgb urine dipstick TRACE (*)    Nitrite POSITIVE (*)    Leukocytes, UA LARGE (*)    All other components within normal limits  URINE MICROSCOPIC-ADD ON - Abnormal; Notable for the following:    Squamous Epithelial / LPF 0-5 (*)    Bacteria, UA MANY (*)    All other components within normal limits    EKG  EKG Interpretation None  Radiology No results found.  Procedures Procedures (including critical care time)  Medications Ordered in ED Medications - No data to display   Initial Impression / Assessment and Plan / ED Course  I have reviewed the triage vital signs and the nursing notes.  Pertinent labs & imaging results that were available during my care of the patient were reviewed by me and considered in my medical decision making (see chart for details).  Clinical Course    Pt given valium and morphine for pain Will tx for uti  Final Clinical Impressions(s) / ED Diagnoses   Final diagnoses:  None    New Prescriptions New Prescriptions   No medications on file     Lorre Nick, MD 09/08/16 1734

## 2016-09-08 NOTE — ED Notes (Signed)
Pt informed she is next to be called to room on adult side but will wait until her children are discharged from the peds ED.

## 2016-09-08 NOTE — ED Notes (Signed)
patient called x3 for vitals, no answer

## 2016-09-08 NOTE — ED Notes (Signed)
PT. IS IN PEDS WITH CHILDREN, IS CONCERNED NOT TO LOSE HER PLACE. CHILDREN ARE ALMOST FINISHED AND READY TO CHECK OUT.

## 2016-09-10 ENCOUNTER — Ambulatory Visit (INDEPENDENT_AMBULATORY_CARE_PROVIDER_SITE_OTHER): Payer: Medicaid Other | Admitting: Family Medicine

## 2016-09-10 ENCOUNTER — Encounter: Payer: Self-pay | Admitting: Family Medicine

## 2016-09-10 VITALS — BP 110/76 | Temp 98.6°F | Wt 142.0 lb

## 2016-09-10 DIAGNOSIS — M545 Low back pain, unspecified: Secondary | ICD-10-CM

## 2016-09-10 DIAGNOSIS — J069 Acute upper respiratory infection, unspecified: Secondary | ICD-10-CM | POA: Diagnosis not present

## 2016-09-10 MED ORDER — PREDNISONE 20 MG PO TABS: ORAL_TABLET | ORAL | 0 refills | Status: DC

## 2016-09-10 NOTE — Progress Notes (Signed)
   Subjective:    Patient ID: Heather Moon, female    DOB: 04-Dec-1981, 35 y.o.   MRN: [id number]  Sinusitis  This is a new problem. The current episode started in the past 7 days. The problem is unchanged. The pain is moderate. Associated symptoms include coughing and a sore throat. (Back pain ) Past treatments include oral decongestants. The treatment provided no relief.   Patient with head congestion drainage over the past few days no high fever chills or sweats she also relates significant low back pain does not radiate down the legs denies any fevers or chills   Review of Systems  HENT: Positive for sore throat.   Respiratory: Positive for cough.   This patient relates low back pain does not radiate. Does relate neck pain she sees pain management for this     Objective:   Physical Exam Lungs clear hearts regular low back mild tenderness negative straight leg raise.  The patient was seen after hours to prevent an emergency department visit      Assessment & Plan:  Viral syndrome if ongoing trouble call us back we will send in an antibiotic  Acute lumbar pain prednisone taper continue pain medicines per pain management see pain management on a regular basis follow-up if any ongoing troubles

## 2016-09-19 ENCOUNTER — Encounter: Payer: Self-pay | Admitting: Nurse Practitioner

## 2016-09-19 ENCOUNTER — Ambulatory Visit (INDEPENDENT_AMBULATORY_CARE_PROVIDER_SITE_OTHER): Payer: Medicaid Other | Admitting: Nurse Practitioner

## 2016-09-19 VITALS — BP 106/68 | Ht 64.0 in | Wt 137.1 lb

## 2016-09-19 DIAGNOSIS — Z90711 Acquired absence of uterus with remaining cervical stump: Secondary | ICD-10-CM

## 2016-09-19 DIAGNOSIS — Z124 Encounter for screening for malignant neoplasm of cervix: Secondary | ICD-10-CM

## 2016-09-19 DIAGNOSIS — Z01419 Encounter for gynecological examination (general) (routine) without abnormal findings: Secondary | ICD-10-CM | POA: Diagnosis not present

## 2016-09-19 DIAGNOSIS — Z113 Encounter for screening for infections with a predominantly sexual mode of transmission: Secondary | ICD-10-CM

## 2016-09-19 DIAGNOSIS — Z0000 Encounter for general adult medical examination without abnormal findings: Secondary | ICD-10-CM | POA: Diagnosis not present

## 2016-09-19 DIAGNOSIS — Z1151 Encounter for screening for human papillomavirus (HPV): Secondary | ICD-10-CM

## 2016-09-19 DIAGNOSIS — K625 Hemorrhage of anus and rectum: Secondary | ICD-10-CM

## 2016-09-19 DIAGNOSIS — F419 Anxiety disorder, unspecified: Secondary | ICD-10-CM

## 2016-09-20 ENCOUNTER — Encounter: Payer: Self-pay | Admitting: Nurse Practitioner

## 2016-09-20 ENCOUNTER — Encounter: Payer: Self-pay | Admitting: Family Medicine

## 2016-09-20 DIAGNOSIS — F419 Anxiety disorder, unspecified: Secondary | ICD-10-CM | POA: Insufficient documentation

## 2016-09-20 MED ORDER — CITALOPRAM HYDROBROMIDE 20 MG PO TABS: 20.0000 mg | ORAL_TABLET | Freq: Every day | ORAL | 2 refills | Status: DC

## 2016-09-20 NOTE — Progress Notes (Signed)
Subjective:    Patient ID: Heather Moon, female    DOB: 09/02/1981, 35 y.o.   MRN: [id number]  HPI presents for her wellness exam. Same sexual partner but would like STD testing with PAP smear. Had a supracervical hysterectomy. Active. Regular vision exams. Needs dental exam. Has had occasional bright red blood with stools, only with straining. Both of her parents have had colon polyps. Both of her grandfathers had colon cancer, age unknown. Denies active hemorrhoids but did have a problem during her pregnancies. Has a teenage son at home that has been causing her increased stress. Was treated with daily med for anxiety years ago but does not remember name of med.     Review of Systems  Constitutional: Negative for activity change, appetite change and fatigue.  HENT: Negative for dental problem, ear pain, sinus pressure and sore throat.   Respiratory: Negative for cough, chest tightness, shortness of breath and wheezing.   Cardiovascular: Negative for chest pain.  Gastrointestinal: Positive for blood in stool and constipation. Negative for abdominal distention, abdominal pain, diarrhea, nausea and vomiting.  Genitourinary: Negative for difficulty urinating, dysuria, enuresis, frequency, genital sores, pelvic pain, urgency and vaginal discharge.  Psychiatric/Behavioral: The patient is nervous/anxious.        Objective:   Physical Exam  Constitutional: She is oriented to person, place, and time. She appears well-developed. No distress.  HENT:  Right Ear: External ear normal.  Left Ear: External ear normal.  Mouth/Throat: Oropharynx is clear and moist.  Neck: Normal range of motion. Neck supple. No tracheal deviation present. No thyromegaly present.  Cardiovascular: Normal rate, regular rhythm and normal heart sounds.  Exam reveals no gallop.   No murmur heard. Pulmonary/Chest: Effort normal and breath sounds normal.  Abdominal: Soft. She exhibits no distension and no mass. There is no  tenderness. There is no rebound and no guarding.  Genitourinary: Vagina normal. No vaginal discharge found.  Genitourinary Comments: External GU: no rashes or lesions. Vagina: no discharge. Cervix normal in appearance. Bimanual exam: no tenderness or obvious masses.  Musculoskeletal: She exhibits no edema.  Lymphadenopathy:    She has no cervical adenopathy.  Neurological: She is alert and oriented to person, place, and time.  Skin: Skin is warm and dry. No rash noted.  Psychiatric: She has a normal mood and affect. Her behavior is normal.  Breast exam: areas of dense tissue; no masses; axillae no adenopathy.        Assessment & Plan:   Problem List Items Addressed This Visit      Other   Anxiety   Relevant Medications   citalopram (CELEXA) 20 MG tablet   History of hysterectomy, supracervical    Other Visit Diagnoses    Well woman exam    -  Primary   Relevant Orders   Pap IG, CT/NG NAA, and HPV (high risk)   Lipid panel   Basic metabolic panel   TSH   VITAMIN D 25 Hydroxy (Vit-D Deficiency, Fractures)   Screening for cervical cancer       Relevant Orders   Pap IG, CT/NG NAA, and HPV (high risk)   Screening for HPV (human papillomavirus)       Relevant Orders   Pap IG, CT/NG NAA, and HPV (high risk)   Screen for STD (sexually transmitted disease)       Relevant Orders   Pap IG, CT/NG NAA, and HPV (high risk)   Rectal bleeding  Refer to GI specialist especially because of family history. Reviewed old chart; will restart celexa. Call back if any problems.  Return in about 1 month (around 10/20/2016) for recheck anxiety.

## 2016-09-23 ENCOUNTER — Telehealth: Payer: Self-pay | Admitting: Nurse Practitioner

## 2016-09-23 NOTE — Telephone Encounter (Signed)
Pt called wanting to know the results to her recent pap.

## 2016-09-24 ENCOUNTER — Other Ambulatory Visit: Payer: Self-pay | Admitting: Family Medicine

## 2016-09-24 LAB — PAP IG, CT-NG NAA, HPV HIGH-RISK
Chlamydia, Nuc. Acid Amp: NEGATIVE
GONOCOCCUS BY NUCLEIC ACID AMP: NEGATIVE
HPV, high-risk: NEGATIVE
PAP Smear Comment: 0

## 2016-09-24 NOTE — Telephone Encounter (Signed)
Notified patient HPV and STD testing are back and negative; PAP still processing. Patient verbalized understanding.

## 2016-09-24 NOTE — Telephone Encounter (Signed)
HPV and STD testing are back and negative; PAP still processing.

## 2016-09-25 LAB — LIPID PANEL
CHOLESTEROL TOTAL: 150 mg/dL (ref 100–199)
Chol/HDL Ratio: 2.6 ratio units (ref 0.0–4.4)
HDL: 58 mg/dL (ref >39)
LDL Calculated: 78 mg/dL (ref 0–99)
Triglycerides: 71 mg/dL (ref 0–149)
VLDL Cholesterol Cal: 14 mg/dL (ref 5–40)

## 2016-09-25 LAB — BASIC METABOLIC PANEL
BUN/Creatinine Ratio: 4 — ABNORMAL LOW (ref 9–23)
BUN: 5 mg/dL — AB (ref 6–20)
CALCIUM: 9.1 mg/dL (ref 8.7–10.2)
CO2: 25 mmol/L (ref 18–29)
CREATININE: 1.12 mg/dL — AB (ref 0.57–1.00)
Chloride: 102 mmol/L (ref 96–106)
GFR calc Af Amer: 73 mL/min/{1.73_m2} (ref >59)
GFR, EST NON AFRICAN AMERICAN: 63 mL/min/{1.73_m2} (ref >59)
Glucose: 81 mg/dL (ref 65–99)
POTASSIUM: 4.7 mmol/L (ref 3.5–5.2)
Sodium: 141 mmol/L (ref 134–144)

## 2016-09-25 LAB — VITAMIN D 25 HYDROXY (VIT D DEFICIENCY, FRACTURES): VIT D 25 HYDROXY: 34.3 ng/mL (ref 30.0–100.0)

## 2016-09-25 LAB — TSH: TSH: 1.56 u[IU]/mL (ref 0.450–4.500)

## 2016-09-25 NOTE — Telephone Encounter (Signed)
May have 30 day with 4 refills

## 2016-10-03 ENCOUNTER — Encounter: Payer: Self-pay | Admitting: Nurse Practitioner

## 2016-10-03 ENCOUNTER — Telehealth: Payer: Self-pay | Admitting: Family Medicine

## 2016-10-03 NOTE — Telephone Encounter (Signed)
Pt calling to get lab results. Please advise.

## 2016-10-04 NOTE — Telephone Encounter (Signed)
Already addressed

## 2016-10-11 ENCOUNTER — Ambulatory Visit: Payer: Medicaid Other | Admitting: Nurse Practitioner

## 2016-10-11 ENCOUNTER — Encounter: Payer: Self-pay | Admitting: Nurse Practitioner

## 2016-10-22 ENCOUNTER — Emergency Department (HOSPITAL_COMMUNITY)
Admission: EM | Admit: 2016-10-22 | Discharge: 2016-10-22 | Disposition: A | Discharge status: Home / Self Care | Payer: Medicaid Other | Attending: Emergency Medicine | Admitting: Emergency Medicine

## 2016-10-22 ENCOUNTER — Encounter (HOSPITAL_COMMUNITY): Payer: Self-pay | Admitting: Emergency Medicine

## 2016-10-22 DIAGNOSIS — G8929 Other chronic pain: Secondary | ICD-10-CM | POA: Diagnosis not present

## 2016-10-22 DIAGNOSIS — M25561 Pain in right knee: Secondary | ICD-10-CM | POA: Insufficient documentation

## 2016-10-22 DIAGNOSIS — K5903 Drug induced constipation: Secondary | ICD-10-CM | POA: Insufficient documentation

## 2016-10-22 LAB — CBC
HCT: 42.7 % (ref 36.0–46.0)
HEMOGLOBIN: 14.1 g/dL (ref 12.0–15.0)
MCH: 31.2 pg (ref 26.0–34.0)
MCHC: 33 g/dL (ref 30.0–36.0)
MCV: 94.5 fL (ref 78.0–100.0)
PLATELETS: 191 10*3/uL (ref 150–400)
RBC: 4.52 MIL/uL (ref 3.87–5.11)
RDW: 12.4 % (ref 11.5–15.5)
WBC: 5.7 10*3/uL (ref 4.0–10.5)

## 2016-10-22 LAB — I-STAT BETA HCG BLOOD, ED (MC, WL, AP ONLY)

## 2016-10-22 LAB — COMPREHENSIVE METABOLIC PANEL
ALK PHOS: 60 U/L (ref 38–126)
ALT: 21 U/L (ref 14–54)
ANION GAP: 9 (ref 5–15)
AST: 26 U/L (ref 15–41)
Albumin: 4 g/dL (ref 3.5–5.0)
BILIRUBIN TOTAL: 0.6 mg/dL (ref 0.3–1.2)
BUN: 6 mg/dL (ref 6–20)
CALCIUM: 9.3 mg/dL (ref 8.9–10.3)
CO2: 26 mmol/L (ref 22–32)
CREATININE: 1.33 mg/dL — AB (ref 0.44–1.00)
Chloride: 102 mmol/L (ref 101–111)
GFR, EST AFRICAN AMERICAN: 58 mL/min — AB (ref >60)
GFR, EST NON AFRICAN AMERICAN: 50 mL/min — AB (ref >60)
Glucose, Bld: 113 mg/dL — ABNORMAL HIGH (ref 65–99)
Potassium: 3.5 mmol/L (ref 3.5–5.1)
Sodium: 137 mmol/L (ref 135–145)
TOTAL PROTEIN: 6.7 g/dL (ref 6.5–8.1)

## 2016-10-22 LAB — LIPASE, BLOOD: Lipase: 30 U/L (ref 11–51)

## 2016-10-22 MED ORDER — POLYETHYLENE GLYCOL 3350 17 G PO PACK: 17.0000 g | PACK | Freq: Every day | ORAL | 0 refills | Status: DC
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
## 2016-10-23 ENCOUNTER — Encounter: Payer: Self-pay | Admitting: Gastroenterology

## 2016-10-25 ENCOUNTER — Ambulatory Visit (INDEPENDENT_AMBULATORY_CARE_PROVIDER_SITE_OTHER): Payer: Medicaid Other | Admitting: Nurse Practitioner

## 2016-10-25 ENCOUNTER — Encounter: Payer: Self-pay | Admitting: Nurse Practitioner

## 2016-10-25 VITALS — BP 100/68 | Ht 64.0 in | Wt 142.4 lb

## 2016-10-25 DIAGNOSIS — F419 Anxiety disorder, unspecified: Secondary | ICD-10-CM

## 2016-10-25 DIAGNOSIS — G4700 Insomnia, unspecified: Secondary | ICD-10-CM | POA: Diagnosis not present

## 2016-10-25 MED ORDER — CITALOPRAM HYDROBROMIDE 20 MG PO TABS: ORAL_TABLET | ORAL | 2 refills | Status: DC

## 2016-10-26 ENCOUNTER — Encounter: Payer: Self-pay | Admitting: Nurse Practitioner

## 2016-10-26 NOTE — Progress Notes (Signed)
Subjective:  Presents for recheck on her anxiety. Doing better on Celexa. Sleeping has improved. Has an appointment with GI; see previous notes.   Objective:   BP 100/68   Ht 5\' 4"  (1.626 m)   Wt 142 lb 6.4 oz (64.6 kg)   BMI 24.44 kg/m  NAD. Alert, oriented. Lungs clear. Heart RRR.   Assessment:  Problem List Items Addressed This Visit      Other   Anxiety - Primary   Relevant Medications   citalopram (CELEXA) 20 MG tablet   Insomnia     Plan:  Meds ordered this encounter  Medications  . citalopram (CELEXA) 20 MG tablet    Sig: Take 1 1/2 po qd    Dispense:  45 tablet    Refill:  2    Order Specific Question:   Supervising Provider    Answer:   Merlyn AlbertLUKING, WILLIAM S [[id number]]   Patient would like to increase dose slightly. Increase to 30 mg. Call back if any problem with new dosing.  Return in about 4 months (around 02/22/2017) for recheck.

## 2016-10-31 ENCOUNTER — Ambulatory Visit: Payer: Medicaid Other | Admitting: Nurse Practitioner

## 2016-11-06 ENCOUNTER — Ambulatory Visit: Payer: Self-pay | Admitting: Gastroenterology

## 2016-11-12 ENCOUNTER — Telehealth: Payer: Self-pay | Admitting: Family Medicine

## 2016-11-12 NOTE — Telephone Encounter (Signed)
Patient went to her orthopedic doctor the other day and had an MRI done on her knee.  She said the MRI showed nothing wrong but they gave her cortizone shots in her knee which isn't helping her.  She said the ortho doctor will not intervene with pain clinic.  She said she isn't going to the pain clinic anymore and she is totally out of her pain medication.  She said she was being given Percocet from that office and she felt she is immune to it anyways.    She wants to know if we can give her something stronger for pain or what her alternatives are?

## 2016-11-13 ENCOUNTER — Encounter: Payer: Self-pay | Admitting: Gastroenterology

## 2016-11-13 ENCOUNTER — Ambulatory Visit (INDEPENDENT_AMBULATORY_CARE_PROVIDER_SITE_OTHER): Payer: Medicaid Other | Admitting: Gastroenterology

## 2016-11-13 ENCOUNTER — Encounter (INDEPENDENT_AMBULATORY_CARE_PROVIDER_SITE_OTHER): Payer: Self-pay

## 2016-11-13 VITALS — BP 124/80 | HR 72 | Ht 64.0 in | Wt 142.0 lb

## 2016-11-13 DIAGNOSIS — R1084 Generalized abdominal pain: Secondary | ICD-10-CM

## 2016-11-13 DIAGNOSIS — K5909 Other constipation: Secondary | ICD-10-CM | POA: Diagnosis not present

## 2016-11-13 DIAGNOSIS — K625 Hemorrhage of anus and rectum: Secondary | ICD-10-CM | POA: Diagnosis not present

## 2016-11-13 MED ORDER — NA SULFATE-K SULFATE-MG SULF 17.5-3.13-1.6 GM/177ML PO SOLN: ORAL | 0 refills | Status: DC

## 2016-11-13 MED ORDER — LINACLOTIDE 145 MCG PO CAPS: 145.0000 ug | ORAL_CAPSULE | Freq: Every day | ORAL | 3 refills | Status: DC

## 2016-11-13 NOTE — Telephone Encounter (Signed)
Spoke with patient and informed her per Dr.Scott Luking- #1 this is not something we can simply just start assuming and  Prescribe . #2 our office will not be able to prescribe anything stronger. That would have to be through a pain clinic.Patient verbalized understanding and stated that she stopped going to pain management because she was taking 1 1/2 tablets of the medication that they were giving her instead of one due to her pain level and they wouldn't give her anymore. Patient states she is willing to go back to pain management but wants to try a different office and she was wondering if we could give her a prescription to last her until she can get into a pain management office. Please advise?

## 2016-11-13 NOTE — Patient Instructions (Signed)
   You have been scheduled for a colonoscopy. Please follow written instructions given to you at your visit today.  Please pick up your prep supplies at the pharmacy within the next 1-3 days. If you use inhalers (even only as needed), please bring them with you on the day of your procedure. Your physician has requested that you go to www.startemmi.com and enter the access code given to you at your visit today. This web site gives a general overview about your procedure. However, you should still follow specific instructions given to you by our office regarding your preparation for the procedure.    We have sent the following medications to your pharmacy for you to pick up at your convenience: Linzess    I appreciate the opportunity to care for you.

## 2016-11-13 NOTE — Telephone Encounter (Signed)
Discussed with pt. Pt states she will get notes sent over

## 2016-11-13 NOTE — Telephone Encounter (Signed)
That this person fails to understand is that pain management will not allow the patient to dictate the medication. Instead they will recommend what they feel is best. If she self medicates or changes her how she is taking the medicine she will be fired from the pain management. Eventually no pain management will see her. It is very important for her to try to follow what pain management recommends. She may be referred to a different pain management. I will not be able to prescribe ongoing pain medicines without having copies of the most recent dictations of her current pain management. The patient would have to go to the current pain management and sign a release or come by here and sign a release
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
## 2016-11-13 NOTE — Telephone Encounter (Signed)
#  1 this is not something we can simply just start assuming am prescribing. #2 our office will not be able to prescribe anything stronger. That would have to be through a pain clinic.  number 3 why is she not going to the pain clinic any further- #4 week and help set her up with a new pain clinic.

## 2016-11-14 ENCOUNTER — Telehealth: Payer: Self-pay | Admitting: Family Medicine

## 2016-11-14 NOTE — Telephone Encounter (Signed)
Patient wanted to let you know that she doesn't need the referral to pain management anymore.  The pain management doctor she goes to now is going to get in touch with her ortho doctor and work something out.

## 2016-11-18 ENCOUNTER — Encounter: Payer: Self-pay | Admitting: Gastroenterology

## 2016-11-18 DIAGNOSIS — K5909 Other constipation: Secondary | ICD-10-CM | POA: Insufficient documentation

## 2016-11-18 DIAGNOSIS — K625 Hemorrhage of anus and rectum: Secondary | ICD-10-CM | POA: Insufficient documentation

## 2016-11-18 DIAGNOSIS — R1084 Generalized abdominal pain: Secondary | ICD-10-CM | POA: Insufficient documentation

## 2016-11-19 ENCOUNTER — Telehealth: Payer: Self-pay | Admitting: Gastroenterology

## 2016-11-19 NOTE — Progress Notes (Signed)
Thank you for sending this case to me. I have reviewed the entire note, and the outlined plan seems appropriate.  Agree with scope to rule out obstructive cause and Linzess trial. If colonoscopy unremarkable, will arrange ano-rectal manometry.

## 2016-11-21 ENCOUNTER — Encounter: Payer: Self-pay | Admitting: Gastroenterology

## 2016-12-02 ENCOUNTER — Encounter (HOSPITAL_COMMUNITY): Payer: Self-pay | Admitting: *Deleted

## 2016-12-02 ENCOUNTER — Ambulatory Visit (HOSPITAL_COMMUNITY)
Admission: EM | Admit: 2016-12-02 | Discharge: 2016-12-02 | Disposition: A | Discharge status: Home / Self Care | Payer: Medicaid Other | Attending: Family Medicine | Admitting: Family Medicine

## 2016-12-02 DIAGNOSIS — Z87440 Personal history of urinary (tract) infections: Secondary | ICD-10-CM | POA: Diagnosis not present

## 2016-12-02 DIAGNOSIS — J0100 Acute maxillary sinusitis, unspecified: Secondary | ICD-10-CM | POA: Insufficient documentation

## 2016-12-02 DIAGNOSIS — N3000 Acute cystitis without hematuria: Secondary | ICD-10-CM | POA: Diagnosis not present

## 2016-12-02 DIAGNOSIS — R300 Dysuria: Secondary | ICD-10-CM | POA: Diagnosis not present

## 2016-12-02 DIAGNOSIS — M549 Dorsalgia, unspecified: Secondary | ICD-10-CM | POA: Diagnosis not present

## 2016-12-02 DIAGNOSIS — K8020 Calculus of gallbladder without cholecystitis without obstruction: Secondary | ICD-10-CM | POA: Diagnosis not present

## 2016-12-02 DIAGNOSIS — J069 Acute upper respiratory infection, unspecified: Secondary | ICD-10-CM | POA: Diagnosis not present

## 2016-12-02 DIAGNOSIS — K219 Gastro-esophageal reflux disease without esophagitis: Secondary | ICD-10-CM | POA: Diagnosis not present

## 2016-12-02 LAB — POCT URINALYSIS DIP (DEVICE)
BILIRUBIN URINE: NEGATIVE
GLUCOSE, UA: NEGATIVE mg/dL
Hgb urine dipstick: NEGATIVE
KETONES UR: NEGATIVE mg/dL
NITRITE: NEGATIVE
PH: 6 (ref 5.0–8.0)
Protein, ur: NEGATIVE mg/dL
Specific Gravity, Urine: 1.01 (ref 1.005–1.030)
Urobilinogen, UA: 0.2 mg/dL (ref 0.0–1.0)

## 2016-12-02 MED ORDER — AMOXICILLIN-POT CLAVULANATE 875-125 MG PO TABS: 1.0000 | ORAL_TABLET | Freq: Two times a day (BID) | ORAL | 0 refills | Status: DC

## 2016-12-02 MED ORDER — IPRATROPIUM BROMIDE 0.06 % NA SOLN: 2.0000 | Freq: Four times a day (QID) | NASAL | 12 refills | Status: DC

## 2016-12-02 NOTE — ED Triage Notes (Signed)
Pt  Reports    Pt    Has   Symptoms  Of      Urinary  Tract  Infection   Back    X  4-5  Days      Pt   Has  Been taking  otc  meds           Pt  Also  Reports  Stuffy  Nose  And  Congestion     With  Sinus  Pressure   And      Pressure  In  Ears   Family  Members  Have  Similar  Symptoms

## 2016-12-02 NOTE — ED Provider Notes (Signed)
CSN: [id number]     Arrival date & time 12/02/16  1753 History   First MD Initiated Contact with Patient 12/02/16 1918     Chief Complaint  Patient presents with  . URI   (Consider location/radiation/quality/duration/timing/severity/associated sxs/prior Treatment) HPI  Heather Moon is a 35 y.o. female presenting to UC with c/o 4-5 days of back pain, urinary frequency and dysuria that feels c/w prior UTIs. Last UTI was about 1 month ago. Pt believes she was on Cipro. She reports hx of "chronic kidney infections" but is not on daily preventative antibiotics.  She is also c/o sinus pain and congestion with mild intermittent productive cough and sore throat. She has taken OTC Azo and OTC cough/cold medication with mild relief.  Denies n/v/d.    Past Medical History:  Diagnosis Date  . Blood transfusion without reported diagnosis   . Cholelithiasis   . Chronic insomnia   . Constipation, chronic   . GERD (gastroesophageal reflux disease)    takes Prilosec as needed  . Kidney infection   . Migraine   . Migraine headache   . 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 UTI (lower urinary tract infection)    Past Surgical History:  Procedure Laterality Date  . ABDOMINAL HYSTERECTOMY  08/27/2010   supracervical, blood transfusion  . CHOLECYSTECTOMY  01/11/2013   Procedure: LAPAROSCOPIC CHOLECYSTECTOMY;  Surgeon: Harl Bowie, MD;  Location: [facility];  Service: General;  Laterality: N/A;  Laparoscopic cholecystectomy  . EYE SURGERY Left    at 50mo old  . RENAL ARTERY STENT     Family History  Problem Relation Age of Onset  . Cancer Maternal Grandmother   . Cirrhosis Maternal Grandfather    Social History  Substance Use Topics  . Smoking status: Never Smoker  . Smokeless tobacco: Never Used  . Alcohol use No   OB History    No data available     Review of Systems  Constitutional: Negative for chills and fever.  HENT: Positive for congestion, ear  pain, postnasal drip, rhinorrhea, sinus pain, sinus pressure and sore throat. Negative for trouble swallowing and voice change.   Respiratory: Positive for cough. Negative for shortness of breath.   Cardiovascular: Negative for chest pain and palpitations.  Gastrointestinal: Negative for abdominal pain, diarrhea, nausea and vomiting.  Genitourinary: Positive for dysuria, flank pain ( bilateral) and frequency. Negative for hematuria.  Musculoskeletal: Positive for back pain. Negative for arthralgias and myalgias.  Skin: Negative for rash.    Allergies  Doxycycline; Hydrocodone; Macrolides and ketolides; and Trazodone and nefazodone  Home Medications   Prior to Admission medications   Medication Sig Start Date End Date Taking? Authorizing Provider  amoxicillin-clavulanate (AUGMENTIN) 875-125 MG tablet Take 1 tablet by mouth 2 (two) times daily. One po bid x 7 days 12/02/16   ENoland Fordyce PA-C  citalopram (CELEXA) 20 MG tablet Take 1 1/2 po qd 10/25/16   CNilda Simmer NP  docusate sodium (COLACE) 100 MG capsule Take 100 mg by mouth 2 (two) times daily.    Historical Provider, MD  docusate sodium (ENEMEEZ) 283 MG enema Place 1 enema rectally daily.    Historical Provider, MD  ipratropium (ATROVENT) 0.06 % nasal spray Place 2 sprays into both nostrils 4 (four) times daily. 12/02/16   ENoland Fordyce PA-C  linaclotide ([facility] 145 MCG CAPS capsule Take 1 capsule (145 mcg total) by mouth daily before breakfast. 11/13/16   JLaban Emperor  Zehr, PA-C  Na Sulfate-K Sulfate-Mg Sulf (SUPREP BOWEL PREP KIT) 17.5-3.13-1.6 GM/180ML SOLN Use as directed 11/13/16   Janett Billow D Zehr, PA-C  polyethylene glycol (MIRALAX) packet Take 17 g by mouth daily. 10/22/16   Zenovia Jarred, DO  zolpidem (AMBIEN) 10 MG tablet TAKE 1 TABLET AT BEDTIME AS NEEDED FOR SLEEP 09/25/16   Kathyrn Drown, MD   Meds Ordered and Administered this Visit  Medications - No data to display  BP 110/74 (BP Location: Right Arm)   Pulse  64   Temp 98.8 F (37.1 C) (Oral)   Resp 18   SpO2 100%  No data found.   Physical Exam  Constitutional: She appears well-developed and well-nourished. No distress.  HENT:  Head: Normocephalic and atraumatic.  Right Ear: Tympanic membrane normal.  Left Ear: Tympanic membrane normal.  Nose: Mucosal edema present. Right sinus exhibits maxillary sinus tenderness. Right sinus exhibits no frontal sinus tenderness. Left sinus exhibits maxillary sinus tenderness. Left sinus exhibits no frontal sinus tenderness.  Mouth/Throat: Uvula is midline, oropharynx is clear and moist and mucous membranes are normal.  Eyes: Conjunctivae are normal. No scleral icterus.  Neck: Normal range of motion. Neck supple.  Cardiovascular: Normal rate, regular rhythm and normal heart sounds.   Pulmonary/Chest: Effort normal and breath sounds normal. No stridor. No respiratory distress. She has no wheezes. She has no rales.  Abdominal: Soft. She exhibits no distension. There is no tenderness.  Musculoskeletal: Normal range of motion. She exhibits tenderness. She exhibits no edema.  No midline spinal tenderness. Tenderness to bilateral mid to lower back muscles.  Lymphadenopathy:    She has no cervical adenopathy.  Neurological: She is alert.  Skin: Skin is warm and dry. She is not diaphoretic.  Nursing note and vitals reviewed.   Urgent Care Course   Clinical Course     Procedures (including critical care time)  Labs Review Labs Reviewed  POCT URINALYSIS DIP (DEVICE) - Abnormal; Notable for the following:       Result Value   Leukocytes, UA MODERATE (*)    All other components within normal limits  URINE CULTURE    Imaging Review No results found.     MDM   1. Upper respiratory tract infection, unspecified type   2. Acute cystitis without hematuria   3. Acute non-recurrent maxillary sinusitis    Pt c/o urinary symptoms c/w prior UTIs as well as URI and sinus symptoms.  UA: moderate  leukocytes. Culture sent. Will start pt on Augmentin to cover for UTI as well as sinusitis. Encouraged good hydration. Advised pt she may get a call to add or change antibiotic depending on urine culture. Patient verbalized understanding and agreement with treatment plan.    Noland Fordyce, PA-C 12/02/16 2027

## 2016-12-05 LAB — URINE CULTURE: Culture: >=100000 — AB

## 2016-12-13 ENCOUNTER — Encounter: Payer: Self-pay | Admitting: Nurse Practitioner

## 2016-12-13 ENCOUNTER — Ambulatory Visit (INDEPENDENT_AMBULATORY_CARE_PROVIDER_SITE_OTHER): Payer: Medicaid Other | Admitting: Nurse Practitioner

## 2016-12-13 VITALS — BP 112/72 | Wt 143.6 lb

## 2016-12-13 DIAGNOSIS — M25561 Pain in right knee: Secondary | ICD-10-CM | POA: Diagnosis not present

## 2016-12-13 DIAGNOSIS — F419 Anxiety disorder, unspecified: Secondary | ICD-10-CM

## 2016-12-13 DIAGNOSIS — S8001XA Contusion of right knee, initial encounter: Secondary | ICD-10-CM | POA: Insufficient documentation

## 2016-12-13 DIAGNOSIS — S8001XD Contusion of right knee, subsequent encounter: Secondary | ICD-10-CM | POA: Diagnosis not present

## 2016-12-13 DIAGNOSIS — G8929 Other chronic pain: Secondary | ICD-10-CM | POA: Diagnosis not present

## 2016-12-13 MED ORDER — MELOXICAM 15 MG PO TABS: 15.0000 mg | ORAL_TABLET | Freq: Every day | ORAL | 0 refills | Status: DC
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
## 2016-12-14 ENCOUNTER — Encounter (HOSPITAL_COMMUNITY): Payer: Self-pay

## 2016-12-14 ENCOUNTER — Emergency Department (HOSPITAL_COMMUNITY)
Admission: EM | Admit: 2016-12-14 | Discharge: 2016-12-15 | Disposition: A | Discharge status: Home / Self Care | Payer: Medicaid Other | Attending: Emergency Medicine | Admitting: Emergency Medicine

## 2016-12-14 DIAGNOSIS — N72 Inflammatory disease of cervix uteri: Secondary | ICD-10-CM | POA: Diagnosis not present

## 2016-12-14 DIAGNOSIS — R300 Dysuria: Secondary | ICD-10-CM | POA: Diagnosis present

## 2016-12-14 DIAGNOSIS — Z79899 Other long term (current) drug therapy: Secondary | ICD-10-CM | POA: Insufficient documentation

## 2016-12-14 LAB — URINALYSIS, ROUTINE W REFLEX MICROSCOPIC
Bilirubin Urine: NEGATIVE
GLUCOSE, UA: NEGATIVE mg/dL
Hgb urine dipstick: NEGATIVE
Ketones, ur: NEGATIVE mg/dL
Nitrite: NEGATIVE
PROTEIN: NEGATIVE mg/dL
Specific Gravity, Urine: 1.01 (ref 1.005–1.030)
pH: 6 (ref 5.0–8.0)
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
## 2016-12-14 NOTE — ED Triage Notes (Signed)
Pt endorses lower left back pain and was recently treated for UTI with augmentin and still having burning while urinating. Pt endorses running a fever last night and has been taking ibuprofen 800mg  which she last took at 1430. Pt afebrile in triage. VSS, NAD.

## 2016-12-15 LAB — WET PREP, GENITAL
CLUE CELLS WET PREP: NONE SEEN
TRICH WET PREP: NONE SEEN
Yeast Wet Prep HPF POC: NONE SEEN

## 2016-12-15 LAB — I-STAT BETA HCG BLOOD, ED (MC, WL, AP ONLY): I-stat hCG, quantitative: <5 m[IU]/mL (ref <5)

## 2016-12-15 LAB — RPR: RPR: NONREACTIVE

## 2016-12-15 LAB — HIV ANTIBODY (ROUTINE TESTING W REFLEX): HIV SCREEN 4TH GENERATION: NONREACTIVE

## 2016-12-15 MED ORDER — ONDANSETRON 4 MG PO TBDP
4.0000 mg | ORAL_TABLET | Freq: Once | ORAL | Status: AC
  Administered 2016-12-15: 4 mg via ORAL
  Filled 2016-12-15: qty 1

## 2016-12-15 MED ORDER — AZITHROMYCIN 250 MG PO TABS
1000.0000 mg | ORAL_TABLET | Freq: Once | ORAL | Status: AC
  Administered 2016-12-15: 1000 mg via ORAL
  Filled 2016-12-15: qty 4

## 2016-12-15 MED ORDER — STERILE WATER FOR INJECTION IJ SOLN
INTRAMUSCULAR | Status: AC
  Administered 2016-12-15: 0.9 mL
  Filled 2016-12-15: qty 10

## 2016-12-15 MED ORDER — PHENAZOPYRIDINE HCL 100 MG PO TABS
95.0000 mg | ORAL_TABLET | Freq: Once | ORAL | Status: AC
  Administered 2016-12-15: 100 mg via ORAL
  Filled 2016-12-15: qty 1

## 2016-12-15 MED ORDER — CEFTRIAXONE SODIUM 250 MG IJ SOLR
250.0000 mg | Freq: Once | INTRAMUSCULAR | Status: AC
  Administered 2016-12-15: 250 mg via INTRAMUSCULAR
  Filled 2016-12-15: qty 250

## 2016-12-15 MED ORDER — PHENAZOPYRIDINE HCL 200 MG PO TABS: 200.0000 mg | ORAL_TABLET | Freq: Three times a day (TID) | ORAL | 0 refills | Status: DC

## 2016-12-17 ENCOUNTER — Telehealth: Payer: Self-pay | Admitting: Nurse Practitioner

## 2016-12-17 LAB — GC/CHLAMYDIA PROBE AMP (~~LOC~~) NOT AT ARMC
CHLAMYDIA, DNA PROBE: NEGATIVE
NEISSERIA GONORRHEA: NEGATIVE
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
## 2016-12-23 ENCOUNTER — Ambulatory Visit: Payer: Self-pay | Admitting: Gastroenterology

## 2016-12-23 ENCOUNTER — Telehealth: Payer: Self-pay | Admitting: Gastroenterology
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
## 2016-12-23 NOTE — Telephone Encounter (Signed)
Pt called today to check on this and stated that she needs this ASAP. Pt has called three times now looking for it. Please advise.

## 2016-12-24 ENCOUNTER — Encounter: Payer: Medicaid Other | Admitting: Gastroenterology

## 2016-12-24 ENCOUNTER — Telehealth: Payer: Self-pay | Admitting: Family Medicine

## 2016-12-24 ENCOUNTER — Other Ambulatory Visit: Payer: Self-pay | Admitting: *Deleted

## 2016-12-24 MED ORDER — CIPROFLOXACIN HCL 500 MG PO TABS: 500.0000 mg | ORAL_TABLET | Freq: Two times a day (BID) | ORAL | 0 refills | Status: DC

## 2016-12-24 NOTE — Telephone Encounter (Signed)
Cipro 500 mg 1 twice a day for 7 days if ongoing troubles I recommend office visit

## 2016-12-24 NOTE — Telephone Encounter (Signed)
Patient said she has had re-occurrent kidney infections.  She said she has been to the ER for this, but she says they aren't giving her the help that she needs.  She said she knows she has a kidney infection. 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  She wants to know if we can just call something in for her since she gets these all the time?  [facility]

## 2016-12-24 NOTE — Telephone Encounter (Signed)
Having dysuria, low back pain, frequent urination, dark urine. Started one week ago. Taking azo.

## 2016-12-24 NOTE — Telephone Encounter (Signed)
Med sent to pharm. Pt notified.  
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
## 2017-02-03 ENCOUNTER — Encounter (HOSPITAL_COMMUNITY): Payer: Self-pay | Admitting: Emergency Medicine

## 2017-02-03 ENCOUNTER — Ambulatory Visit (HOSPITAL_COMMUNITY)
Admission: EM | Admit: 2017-02-03 | Discharge: 2017-02-03 | Disposition: A | Discharge status: Home / Self Care | Payer: Medicaid Other | Attending: Internal Medicine | Admitting: Internal Medicine

## 2017-02-03 DIAGNOSIS — J111 Influenza due to unidentified influenza virus with other respiratory manifestations: Secondary | ICD-10-CM

## 2017-02-03 DIAGNOSIS — R69 Illness, unspecified: Secondary | ICD-10-CM

## 2017-02-03 MED ORDER — BENZONATATE 200 MG PO CAPS: 200.0000 mg | ORAL_CAPSULE | Freq: Three times a day (TID) | ORAL | 1 refills | Status: DC | PRN

## 2017-02-03 MED ORDER — OSELTAMIVIR PHOSPHATE 75 MG PO CAPS: 75.0000 mg | ORAL_CAPSULE | Freq: Two times a day (BID) | ORAL | 0 refills | Status: DC

## 2017-02-03 MED ORDER — PREDNISONE 50 MG PO TABS: 50.0000 mg | ORAL_TABLET | Freq: Every day | ORAL | 0 refills | Status: DC

## 2017-02-03 NOTE — ED Provider Notes (Signed)
[facility]    CSN: [id number] Arrival date & time: 02/03/17  1921     History   Chief Complaint Chief Complaint  Patient presents with  . Fever  . Generalized Body Aches    HPI Heather Moon is a 36 y.o. female. She presents with 2 day history of fevers, achiness, cough. Kids have recently had the flu. She's been taking ibuprofen. She has cough, runny/congested nose. No sore throat. No vomiting. Does have nausea and diarrhea. 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    HPI  Past Medical History:  Diagnosis Date  . Blood transfusion without reported diagnosis   . Cholelithiasis   . Chronic insomnia   . Constipation, chronic   . GERD (gastroesophageal reflux disease)    takes Prilosec as needed  . Kidney infection   . Migraine   . Migraine headache   . 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 UTI (lower urinary tract infection)     Patient Active Problem List   Diagnosis Date Noted  . Chronic pain of right knee 12/13/2016  . Traumatic hematoma of right knee 12/13/2016  . Chronic constipation 11/18/2016  . Generalized abdominal pain 11/18/2016  . Rectal bleeding 11/18/2016  . Anxiety 09/20/2016  . History of hysterectomy, supracervical 09/19/2016  . Insomnia 10/20/2015  . Herniated cervical disc 03/24/2013  . Muscle spasms of head and/or neck 03/05/2013  . Left shoulder pain 03/05/2013  . Migraine headache without aura 03/05/2013    Past Surgical History:  Procedure Laterality Date  . ABDOMINAL HYSTERECTOMY  08/27/2010   supracervical, blood transfusion  . CHOLECYSTECTOMY  01/11/2013   Procedure: LAPAROSCOPIC CHOLECYSTECTOMY;  Surgeon: Shelly Rubenstein, MD;  Location: [facility];  Service: General;  Laterality: N/A;  Laparoscopic cholecystectomy  . EYE SURGERY Left    at 6mos old  . RENAL ARTERY STENT         Home Medications    Prior to Admission  medications   Medication Sig Start Date End Date Taking? Authorizing Provider  ibuprofen (ADVIL,MOTRIN) 800 MG tablet Take 800 mg by mouth every 8 (eight) hours as needed.   Yes Historical Provider, MD  benzonatate (TESSALON) 200 MG capsule Take 1 capsule (200 mg total) by mouth 3 (three) times daily as needed for cough. 02/03/17   Eustace Moore, MD  oseltamivir (TAMIFLU) 75 MG capsule Take 1 capsule (75 mg total) by mouth every 12 (twelve) hours. 02/03/17   Eustace Moore, MD  predniSONE (DELTASONE) 50 MG tablet Take 1 tablet (50 mg total) by mouth daily. 02/03/17   Eustace Moore, MD    Family History Family History  Problem Relation Age of Onset  . Cancer Maternal Grandmother   . Cirrhosis Maternal Grandfather     Social History Social History  Substance Use Topics  . Smoking status: Never Smoker  . Smokeless tobacco: Never Used  . Alcohol use No     Allergies   Doxycycline; Hydrocodone; Macrolides and ketolides; and Trazodone and nefazodone   Review of Systems Review of Systems  All other systems reviewed and are negative.    Physical Exam Triage Vital Signs ED Triage Vitals [02/03/17 1948]  Enc Vitals Group     BP 106/55     Pulse Rate (!) 59     Resp 16     Temp 99 F (37.2 C)  Temp Source Oral     SpO2 100 %     Weight      Height      Pain Score 5   Updated Vital Signs BP 106/55 (BP Location: Right Arm)   Pulse (!) 59   Temp 99 F (37.2 C) (Oral)   Resp 16   SpO2 100%   Physical Exam  Constitutional: She is oriented to person, place, and time. No distress.  Alert, nicely groomed  HENT:  Head: Atraumatic.  Bilateral TMs are mildly dull, no erythema Moderate nasal congestion with mucousy material present Throat is red with postnasal drainage  Eyes:  Conjugate gaze, no eye redness/drainage  Neck: Neck supple.  Cardiovascular: Normal rate and regular rhythm.   Pulmonary/Chest: No respiratory distress. She has no wheezes. She has no rales.    Lungs clear, symmetric breath sounds  Abdominal: She exhibits no distension.  Musculoskeletal: Normal range of motion.  No leg swelling  Neurological: She is alert and oriented to person, place, and time.  Skin: Skin is warm and dry.  No cyanosis  Nursing note and vitals reviewed.    UC Treatments / Results   Procedures Procedures (including critical care time) None today  Final Clinical Impressions(s) / UC Diagnoses   Final diagnoses:  Influenza-like illness   Push fluids, rest.  Anticipate gradual improvement in well being and cough over the next 3-5 days.  Cough/congestion may take a couple weeks to subside.  Recheck for new fever >100.5, increasing phlegm production/nasal discharge, or if not starting to improve in a few days.  Prescriptions sent to [facility] on [address].  New Prescriptions New Prescriptions   BENZONATATE (TESSALON) 200 MG CAPSULE    Take 1 capsule (200 mg total) by mouth 3 (three) times daily as needed for cough.   OSELTAMIVIR (TAMIFLU) 75 MG CAPSULE    Take 1 capsule (75 mg total) by mouth every 12 (twelve) hours.   PREDNISONE (DELTASONE) 50 MG TABLET    Take 1 tablet (50 mg total) by mouth daily.     Eustace MooreLaura W Ashlye Oviedo, MD 02/06/17 2117

## 2017-02-03 NOTE — Discharge Instructions (Addendum)
Push fluids, rest.  Anticipate gradual improvement in well being and cough over the next 3-5 days.  Cough/congestion may take a couple weeks to subside.  Recheck for new fever >100.5, increasing phlegm production/nasal discharge, or if not starting to improve in a few days.  Pres[facility] to [facility] on [address].

## 2017-02-03 NOTE — ED Triage Notes (Signed)
The patient presented to the [facility] with a complaint of nasal congestion, headaches, and general body aches x 3 days.

## 2017-02-20 ENCOUNTER — Telehealth: Payer: Self-pay | Admitting: Family Medicine

## 2017-02-20 NOTE — Telephone Encounter (Signed)
[facility] Ambien.  [facility]

## 2017-02-20 NOTE — Telephone Encounter (Signed)
ambien 10 mg,#30,4rf,1qhs prn insomnia

## 2017-02-21 ENCOUNTER — Other Ambulatory Visit: Payer: Self-pay | Admitting: Family Medicine

## 2017-02-21 ENCOUNTER — Ambulatory Visit: Payer: Medicaid Other | Admitting: Nurse Practitioner

## 2017-02-21 MED ORDER — ZOLPIDEM TARTRATE 10 MG PO TABS: 10.0000 mg | ORAL_TABLET | Freq: Every evening | ORAL | 4 refills | Status: DC | PRN

## 2017-02-21 NOTE — Telephone Encounter (Signed)
Spoke with patient and informed her per Dr.Scott Luking- we are sending in Ambien for insomnia. Patient verbalized understanding.

## 2017-03-05 ENCOUNTER — Ambulatory Visit (HOSPITAL_COMMUNITY)
Admission: EM | Admit: 2017-03-05 | Discharge: 2017-03-05 | Disposition: A | Discharge status: Home / Self Care | Payer: Medicaid Other | Attending: Emergency Medicine | Admitting: Emergency Medicine

## 2017-03-05 ENCOUNTER — Encounter (HOSPITAL_COMMUNITY): Payer: Self-pay | Admitting: *Deleted

## 2017-03-05 DIAGNOSIS — R21 Rash and other nonspecific skin eruption: Secondary | ICD-10-CM

## 2017-03-05 MED ORDER — TRIAMCINOLONE ACETONIDE 0.1 % EX CREA: 1.0000 "application " | TOPICAL_CREAM | Freq: Two times a day (BID) | CUTANEOUS | 1 refills | Status: DC

## 2017-03-05 MED ORDER — METHYLPREDNISOLONE ACETATE 80 MG/ML IJ SUSP
INTRAMUSCULAR | Status: AC
  Filled 2017-03-05: qty 1

## 2017-03-05 MED ORDER — METHYLPREDNISOLONE ACETATE 80 MG/ML IJ SUSP
80.0000 mg | Freq: Once | INTRAMUSCULAR | Status: AC
  Administered 2017-03-05: 80 mg via INTRAMUSCULAR

## 2017-03-05 NOTE — Discharge Instructions (Signed)
Your rash is not consistent with an infestation of scabies. We are treating you today with an injection of Depo-Medrol, and I prescribed a topical steroid as an anti-itch cream. I recommend you take over-the-counter Benadryl, 2 tablets every 6 hours not to exceed 300 mg and any 24-hour period. Should your symptoms fail to resolve, follow up with your primary care provider or return to clinic as needed

## 2017-03-05 NOTE — ED Provider Notes (Signed)
CSN: [id number]     Arrival date & time 03/05/17  1346 History   None    No chief complaint on file.  (Consider location/radiation/quality/duration/timing/severity/associated sxs/prior Treatment) 36 year old female presents to clinic for evaluation of rash. She is concerned about possible exposure to scabies, as her mother was recently in an assisted living facility had an infestation. She describes her rash is itchy, on her shoulder, arms, and legs. She has had no fever, malaise, shortness of breath, coughing, wheezing, chest pain or tightness, nausea, vomiting, diarrhea, weakness, dizziness, or any markers of any systemic illness.   The history is provided by the patient.    Past Medical History:  Diagnosis Date  . Blood transfusion without reported diagnosis   . Cholelithiasis   . Chronic insomnia   . Constipation, chronic   . GERD (gastroesophageal reflux disease)    takes Prilosec as needed  . Kidney infection   . Migraine   . Migraine headache   . 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 UTI (lower urinary tract infection)    Past Surgical History:  Procedure Laterality Date  . ABDOMINAL HYSTERECTOMY  08/27/2010   supracervical, blood transfusion  . CHOLECYSTECTOMY  01/11/2013   Procedure: LAPAROSCOPIC CHOLECYSTECTOMY;  Surgeon: Shelly Rubensteinouglas A Blackman, MD;  Location: [facility];  Service: General;  Laterality: N/A;  Laparoscopic cholecystectomy  . EYE SURGERY Left    at 6mos old  . RENAL ARTERY STENT     Family History  Problem Relation Age of Onset  . Cancer Maternal Grandmother   . Cirrhosis Maternal Grandfather    Social History  Substance Use Topics  . Smoking status: Never Smoker  . Smokeless tobacco: Never Used  . Alcohol use No   OB History    No data available     Review of Systems  Reason unable to perform ROS: as covered in HPI.  Skin: Positive for rash. Negative for color change.  All other systems reviewed and are  negative.   Allergies  Doxycycline; Hydrocodone; Macrolides and ketolides; and Trazodone and nefazodone  Home Medications   Prior to Admission medications   Medication Sig Start Date End Date Taking? Authorizing Provider  ibuprofen (ADVIL,MOTRIN) 800 MG tablet Take 800 mg by mouth every 8 (eight) hours as needed.    Historical Provider, MD  triamcinolone cream (KENALOG) 0.1 % Apply 1 application topically 2 (two) times daily. 03/05/17   Dorena BodoLawrence Rhonna Holster, NP  zolpidem (AMBIEN) 10 MG tablet TAKE 1 TABLET BY MOUTH AT BEDTIME AS NEEDED SLEEP 02/21/17   Babs SciaraScott A Luking, MD  zolpidem (AMBIEN) 10 MG tablet Take 1 tablet (10 mg total) by mouth at bedtime as needed for sleep. 02/21/17 03/23/17  Babs SciaraScott A Luking, MD   Meds Ordered and Administered this Visit   Medications  methylPREDNISolone acetate (DEPO-MEDROL) injection 80 mg (80 mg Intramuscular Given 03/05/17 1514)    BP 132/70 (BP Location: Right Arm)   Pulse 75   Temp 97.9 F (36.6 C) (Oral)   Resp 18   SpO2 100%  No data found.   Physical Exam  Constitutional: She is oriented to person, place, and time. She appears well-developed and well-nourished. No distress.  HENT:  Head: Normocephalic and atraumatic.  Right Ear: Tympanic membrane and external ear normal.  Left Ear: Tympanic membrane and external ear normal.  Nose: Nose normal.  Mouth/Throat: Oropharynx is clear and moist.  Eyes: Pupils are equal, round, and reactive to light.  Neck: Normal range of motion. Neck supple. No JVD present.  Cardiovascular: Normal rate and regular rhythm.   Pulmonary/Chest: Effort normal and breath sounds normal.  Abdominal: Soft. Bowel sounds are normal.  Musculoskeletal: She exhibits no edema or tenderness.  Lymphadenopathy:    She has no cervical adenopathy.  Neurological: She is alert and oriented to person, place, and time.  Skin: Skin is warm and dry. Capillary refill takes less than 2 seconds. Rash noted. She is not diaphoretic.  Pustular  type lesions on her back, arm, and right shoulder. No tracks or burrows or other crusting lesions that would be consistent with scabies.  Psychiatric: She has a normal mood and affect. Her behavior is normal.  Nursing note and vitals reviewed.   Urgent Care Course     Procedures (including critical care time)  Labs Review Labs Reviewed - No data to display  Imaging Review No results found.    MDM   1. Rash     Given an injection of Depo-Medrol in clinic today, as well as prescription for triamcinolone cream, advised take over-the-counter Benadryl, follow-up with primary care for symptoms fail to resolve or worsen in any way.    Dorena Bodo, NP 03/05/17 1714

## 2017-03-10 ENCOUNTER — Telehealth: Payer: Self-pay | Admitting: Family Medicine

## 2017-03-10 ENCOUNTER — Other Ambulatory Visit: Payer: Self-pay | Admitting: *Deleted

## 2017-03-10 MED ORDER — PERMETHRIN 5 % EX CREA: 1.0000 "application " | TOPICAL_CREAM | Freq: Once | CUTANEOUS | 0 refills | Status: AC

## 2017-03-10 NOTE — Telephone Encounter (Signed)
Patient went to the urgent care on 03/05/17 and diagnosed with a rash and given triamcinoline cream.  She said she is out of this cream now, but her mom was recently treated for scabies because the facility she was at had a break out.  She said she believes she has scabies because she has fine bumps in the bends of her legs and in between her fingers.  She wants to know if we can send in Rx for scabies.  She is out of the triamcinoline cream.    [facility]

## 2017-03-10 NOTE — Telephone Encounter (Signed)
elimite per dr Lorin Picketscott sent to pharm. Pt notified

## 2017-07-01 ENCOUNTER — Telehealth: Payer: Self-pay | Admitting: Family Medicine

## 2017-07-01 DIAGNOSIS — M542 Cervicalgia: Secondary | ICD-10-CM

## 2017-07-01 NOTE — Telephone Encounter (Signed)
Patient was being given injections in her neck by her pain specialist, Pain M.D.  She stopped going to them because of transportation issues a long time ago.  She is experiencing stiffness in her neck, severe pain on the right side and some pain on the left.  She said this is causing her severe headaches again.  Sometimes she has trouble swallowing food.  She said all she has to take for it is Ibuprofen, naproxen, muscle relaxer's with no relief.  She is also having trouble sleeping even with Ambien. She wants to know what Dr. Lorin PicketScott recommends.

## 2017-07-01 NOTE — Telephone Encounter (Signed)
Her situation is a difficult one. Unfortunately there is probably not an easy answer. She may end up having it be on a different medication to try to help her sleep. Ideally we would like to stay away from narcotic pain medication. If she feels she needs regular narcotic pain medicine it is best for her to follow through with the pain management doctor. We can try different anti-inflammatories. I would recommend diclofenac 75 mg 1 twice a day #60, 1 refill also recommend Parafon forte one every 6 hours when necessary for muscle tightness only use when at home. Do not drive with this medicine. Do not take at bedtime. #24 with no refills. She should stick with the Ambien for now but it would be wise for her to schedule a standard follow-up visit somewhere in the next several weeks and we can discuss other options to try to help with sleep and also discuss if further intervention for her neck should be done.

## 2017-07-02 ENCOUNTER — Other Ambulatory Visit: Payer: Self-pay | Admitting: Nurse Practitioner

## 2017-07-02 DIAGNOSIS — M5020 Other cervical disc displacement, unspecified cervical region: Secondary | ICD-10-CM

## 2017-07-02 NOTE — Telephone Encounter (Signed)
Just put order in for referral

## 2017-07-02 NOTE — Telephone Encounter (Signed)
Referral ordered in EPIC. Patient notified. 

## 2017-07-02 NOTE — Telephone Encounter (Signed)
Patient wants to return to the pain clinic where she was getting the injections in her neck. Patient states she has not seen them in almost a year and they need a new referral for her to get an appointment for the the neck injections again.

## 2017-07-11 ENCOUNTER — Encounter: Payer: Self-pay | Admitting: Family Medicine

## 2017-07-22 ENCOUNTER — Encounter (HOSPITAL_COMMUNITY): Payer: Self-pay | Admitting: *Deleted

## 2017-07-22 DIAGNOSIS — Y9389 Activity, other specified: Secondary | ICD-10-CM | POA: Diagnosis not present

## 2017-07-22 DIAGNOSIS — S63502A Unspecified sprain of left wrist, initial encounter: Secondary | ICD-10-CM | POA: Diagnosis not present

## 2017-07-22 DIAGNOSIS — Z79899 Other long term (current) drug therapy: Secondary | ICD-10-CM | POA: Diagnosis not present

## 2017-07-22 DIAGNOSIS — S6992XA Unspecified injury of left wrist, hand and finger(s), initial encounter: Secondary | ICD-10-CM | POA: Diagnosis present

## 2017-07-22 DIAGNOSIS — Y929 Unspecified place or not applicable: Secondary | ICD-10-CM | POA: Diagnosis not present

## 2017-07-22 DIAGNOSIS — Y999 Unspecified external cause status: Secondary | ICD-10-CM | POA: Insufficient documentation

## 2017-07-22 DIAGNOSIS — W19XXXA Unspecified fall, initial encounter: Secondary | ICD-10-CM | POA: Diagnosis not present

## 2017-07-22 DIAGNOSIS — M545 Low back pain: Secondary | ICD-10-CM | POA: Diagnosis not present

## 2017-07-22 LAB — URINALYSIS, ROUTINE W REFLEX MICROSCOPIC
Bilirubin Urine: NEGATIVE
Glucose, UA: NEGATIVE mg/dL
Hgb urine dipstick: NEGATIVE
KETONES UR: NEGATIVE mg/dL
Nitrite: NEGATIVE
PH: 6 (ref 5.0–8.0)
Protein, ur: NEGATIVE mg/dL
Specific Gravity, Urine: 1.001 — ABNORMAL LOW (ref 1.005–1.030)

## 2017-07-22 NOTE — ED Triage Notes (Addendum)
Pt fell yesterday after slipping in the mud while installing a fence and attempted to catch herself using her L hand. Pt c/o pain to L wrist and lower back pain with burning sensation. Also reports urinary frequency, which feels similar to UTI symptoms. Pt is also in a pain management clinic and takes percocet and [place]

## 2017-07-23 ENCOUNTER — Emergency Department (HOSPITAL_COMMUNITY): Payer: Medicaid Other

## 2017-07-23 ENCOUNTER — Emergency Department (HOSPITAL_COMMUNITY)
Admission: EM | Admit: 2017-07-23 | Discharge: 2017-07-23 | Disposition: A | Discharge status: Home / Self Care | Payer: Medicaid Other | Attending: Emergency Medicine | Admitting: Emergency Medicine

## 2017-07-23 ENCOUNTER — Other Ambulatory Visit: Payer: Self-pay | Admitting: Family Medicine

## 2017-07-23 DIAGNOSIS — S63502A Unspecified sprain of left wrist, initial encounter: Secondary | ICD-10-CM

## 2017-07-23 DIAGNOSIS — W19XXXA Unspecified fall, initial encounter: Secondary | ICD-10-CM

## 2017-07-23 DIAGNOSIS — M545 Low back pain: Secondary | ICD-10-CM

## 2017-07-23 LAB — PREGNANCY, URINE: PREG TEST UR: NEGATIVE

## 2017-07-23 MED ORDER — OXYCODONE-ACETAMINOPHEN 5-325 MG PO TABS
2.0000 | ORAL_TABLET | Freq: Once | ORAL | Status: AC
  Administered 2017-07-23: 2 via ORAL
  Filled 2017-07-23: qty 2

## 2017-07-23 MED ORDER — KETOROLAC TROMETHAMINE 30 MG/ML IJ SOLN
60.0000 mg | Freq: Once | INTRAMUSCULAR | Status: AC
  Administered 2017-07-23: 60 mg via INTRAMUSCULAR

## 2017-07-23 MED ORDER — KETOROLAC TROMETHAMINE 30 MG/ML IJ SOLN
INTRAMUSCULAR | Status: AC
  Filled 2017-07-23: qty 1

## 2017-07-23 MED ORDER — IBUPROFEN 800 MG PO TABS: 800.0000 mg | ORAL_TABLET | Freq: Three times a day (TID) | ORAL | 0 refills | Status: DC | PRN

## 2017-07-23 MED ORDER — MORPHINE SULFATE (PF) 4 MG/ML IV SOLN
4.0000 mg | Freq: Once | INTRAVENOUS | Status: DC
Start: 2017-07-23 — End: 2017-07-23

## 2017-07-23 MED ORDER — ONDANSETRON 4 MG PO TBDP
4.0000 mg | ORAL_TABLET | Freq: Once | ORAL | Status: AC
  Administered 2017-07-23: 4 mg via ORAL
  Filled 2017-07-23: qty 1

## 2017-07-23 NOTE — ED Provider Notes (Signed)
TIME SEEN: 2:02 AM  By signing my name below, I, Heather Moon, attest that this documentation has been prepared under the direction and in the presence of Gurtej Noyola, Layla Maw, DO. Electronically Signed: Diona Moon, ED Scribe. 07/23/17. 2:06 AM.  CHIEF COMPLAINT: Fall  HPI: Heather Moon is a 36 y.o. female with history of migraines, kidney stones, chronic pain who sees pain management and is on Percocet who presents to the Emergency Department complaining of left hand and wrist pain s/p a fall that occurred yesterday evening, 07/22/17. Pt was digging holes for a fence when she stepped backwards into a hole, causing her to fall onto her buttocks. She attempted to catch herself with her left hand. Pt also c/o of burning lower back pain that started after her fall. Denies numbness, focal weakness, bowel or bladder incontinence, fever. Took Ambien with no relief. Is in a pain management clinic and takes percocet as well. States this is not helping her pain. Left handed. Pt denies head injury, LOC.  Patient not on antiplatelets or anticoagulants.  Has had urinary frequency but no dysuria, hematuria, vaginal bleeding or discharge.  Patient is s/p hysterectomy.  ROS: See HPI Constitutional: no fever  Eyes: no drainage  ENT: no runny nose   Cardiovascular: no chest pain  Resp: no SOB  GI: no vomiting GU: no dysuria Integumentary: no rash  Allergy: no hives  Musculoskeletal: no leg swelling  Neurological: no slurred speech ROS otherwise negative  PAST MEDICAL HISTORY/PAST SURGICAL HISTORY:  Past Medical History:  Diagnosis Date  . Blood transfusion without reported diagnosis   . Cholelithiasis   . Chronic insomnia   . Constipation, chronic   . GERD (gastroesophageal reflux disease)    takes Prilosec as needed  . Kidney infection   . Migraine   . Migraine headache   . 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 UTI (lower urinary tract infection)     MEDICATIONS:  Prior to  Admission medications   Medication Sig Start Date End Date Taking? Authorizing Provider  ibuprofen (ADVIL,MOTRIN) 800 MG tablet Take 800 mg by mouth every 8 (eight) hours as needed.    [provider]  triamcinolone cream (KENALOG) 0.1 % Apply 1 application topically 2 (two) times daily. 03/05/17   Dorena Bodo, NP  zolpidem (AMBIEN) 10 MG tablet TAKE 1 TABLET BY MOUTH AT BEDTIME AS NEEDED SLEEP 02/21/17   [name], MD  zolpidem (AMBIEN) 10 MG tablet Take 1 tablet (10 mg total) by mouth at bedtime as needed for sleep. 02/21/17 03/23/17  [name], MD    ALLERGIES:  Allergies  Allergen Reactions  . Doxycycline Hives  . Hydrocodone Nausea And Vomiting  . Macrolides And Ketolides Nausea Only  . Trazodone And Nefazodone Other (See Comments)    Headaches    SOCIAL HISTORY:  Social History  Substance Use Topics  . Smoking status: Never Smoker  . Smokeless tobacco: Never Used  . Alcohol use No    FAMILY HISTORY: Family History  Problem Relation Age of Onset  . Cancer Maternal Grandmother   . Cirrhosis Maternal Grandfather     EXAM: BP 97/78   Pulse 84   Temp 98.1 F (36.7 C) (Oral)   Resp 16   SpO2 100%  CONSTITUTIONAL: Alert and oriented and responds appropriately to questions. Well-appearing; well-nourished; GCS 15 HEAD: Normocephalic; atraumatic EYES: Conjunctivae clear, PERRL, EOMI ENT: normal nose; no rhinorrhea; moist mucous membranes; pharynx without lesions  noted; no dental injury; no septal hematoma NECK: Supple, no meningismus, no LAD; no midline spinal tenderness, step-off or deformity; trachea midline CARD: RRR; S1 and S2 appreciated; no murmurs, no clicks, no rubs, no gallops RESP: Normal chest excursion without splinting or tachypnea; breath sounds clear and equal bilaterally; no wheezes, no rhonchi, no rales; no hypoxia or respiratory distress CHEST:  chest wall stable, no crepitus or ecchymosis or deformity, nontender to palpation; no flail  chest ABD/GI: Normal bowel sounds; non-distended; soft, non-tender, no rebound, no guarding; no ecchymosis or other lesions noted PELVIS:  stable, nontender to palpation BACK:  The back appears normal and is Tender over the lower lumbar spine; there is no CVA tenderness; no midline step-off or deformity NUU:VOZDGUYEXT:Patient is tender over the dorsal hand and dorsal wrist without any tenderness over the scaphoid. No bony deformity. She has some superficial abrasions noted to the volar wrist. 2+ radial pulse on the left side. Normal range of motion in the fingers and wrist of the left side. No tenderness over the forearm, elbow, humerus or shoulder on the left. No other extremity injury noted. Normal ROM in all joints; otherwise extremities are non-tender to palpation; no edema; normal capillary refill; no cyanosis, no joint effusion, compartments are soft, extremities are warm and well-perfused, no ecchymosis SKIN: Normal color for age and race; warm NEURO: Moves all extremities equally, reports normal sensation diffusely, cranial nerves II through XII intact, normal speech PSYCH: The patient's mood and manner are appropriate. Grooming and personal hygiene are appropriate.  MEDICAL DECISION MAKING: Patient here with mechanical fall that occurred yesterday. Complaining of left wrist and hand pain. X-ray obtained in triage shows no fracture dislocation. She is not tender over the scaphoid and has no pain with axial loading of the thumb. Normal capillary refill and is neurovascularly intact distally with full range of motion in all joints of the left arm. Complains of burning lower back pain. We'll obtain x-rays of the lumbar spine as she does have midline tenderness. Urine shows no sign of infection. She is status post hysterectomy. She is requesting "a shot" for pain control. We'll give morphine, Toradol and reassess. No other sign of trauma on exam. Normal vital signs. Normal neurologic exam.  ED PROGRESS: Lumbar  x-ray shows no acute abdomen. We'll discharge home. Sent urine culture because patient states her urine has had a foul odor and she has had urinary frequency but does not show an obvious UTI today. No vaginal bleeding or discharge. Abdominal exam benign. Back pain started after her fall. Recommended ibuprofen. She has Percocet at home and is on pain management. Discussed rest, elevation and ice.   At this time, I do not feel there is any life-threatening condition present. I have reviewed and discussed all results (EKG, imaging, lab, urine as appropriate) and exam findings with patient/family. I have reviewed nursing notes and appropriate previous records.  I feel the patient is safe to be discharged home without further emergent workup and can continue workup as an outpatient as needed. Discussed usual and customary return precautions. Patient/family verbalize understanding and are comfortable with this plan.  Outpatient follow-up has been provided if needed. All questions have been answered.     I personally performed the services described in this documentation, which was scribed in my presence. The recorded information has been reviewed and is accurate.     Gurkirat Basher, Layla MawKristen N, DO 07/23/17 [phone]

## 2017-07-23 NOTE — ED Notes (Signed)
Patient transported to X-ray 

## 2017-07-23 NOTE — ED Notes (Signed)
Pt flagged down this writer in waiting room wondering if she was going to go to XRAY and how long she was going to wait. Provided answers to questions and apologized for wait.

## 2017-07-25 LAB — URINE CULTURE

## 2017-07-26 ENCOUNTER — Telehealth: Payer: Self-pay

## 2017-07-26 NOTE — Progress Notes (Signed)
ED Antimicrobial Stewardship Positive Culture Follow Up   Heather Moon is an 36 y.o. female who [facility] with a chief complaint of  Chief Complaint  Patient presents with  . Fall  . Urinary Tract Infection    Recent Results (from the past 720 hour(s))  Urine culture     Status: Abnormal   Collection Time: 07/22/17 10:40 PM  Result Value Ref Range Status   Specimen Description Urine  Final   Special Requests NONE  Final   Culture >=100,000 COLONIES/mL ESCHERICHIA COLI (A)  Final   Report Status 07/25/2017 FINAL  Final   Organism ID, Bacteria ESCHERICHIA COLI (A)  Final      Susceptibility   Escherichia coli - MIC*    AMPICILLIN 4 SENSITIVE Sensitive     CEFAZOLIN <=4 SENSITIVE Sensitive     CEFEPIME <=1 SENSITIVE Sensitive     CEFTAZIDIME <=1 SENSITIVE Sensitive     CEFTRIAXONE <=1 SENSITIVE Sensitive     CIPROFLOXACIN <=0.25 SENSITIVE Sensitive     GENTAMICIN <=1 SENSITIVE Sensitive     IMIPENEM <=0.25 SENSITIVE Sensitive     TRIMETH/SULFA <=20 SENSITIVE Sensitive     AMPICILLIN/SULBACTAM <=2 SENSITIVE Sensitive     PIP/TAZO <=4 SENSITIVE Sensitive     Extended ESBL NEGATIVE Sensitive     * >=100,000 COLONIES/mL ESCHERICHIA COLI    [x]  Patient discharged originally without antimicrobial agent and treatment may now be indicated  New antibiotic prescription: Call patient to ask if feeling better, still having urinary symptoms (dysuria, frequency, urgency, etc.). If still having symptoms, start cephalexin 500mg  PO BID x 5 days. If no symptoms, no treatment necessary.  ED Provider: Langston MaskerKaren Sofia, PA   Babs BertinBaird, Heather Moon 07/26/2017, 10:27 AM Infectious Diseases Pharmacist Phone# [phone]

## 2017-07-26 NOTE — Telephone Encounter (Signed)
Post ED Visit - Positive Culture Follow-up: Successful Patient Follow-Up  Culture assessed and recommendations reviewed by: []  Enzo BiNathan Batchelder, Pharm.D. []  Celedonio MiyamotoJeremy Frens, [address] BoulevardPharm[address]D., BCPS AQ-ID []  Garvin FilaMike Maccia, Pharm.D., BCPS []  Georgina PillionElizabeth Martin, Pharm.D., BCPS []  EdgewoodMinh Pham, [address]Pharm[address]D., BCPS, AAHIVP []  Estella HuskMichelle Turner, Pharm.D., BCPS, AAHIVP []  Lysle Pearlachel Rumbarger, PharmD, BCPS []  Casilda Carlsaylor Stone, PharmD, BCPS []  Pollyann SamplesAndy Johnston, PharmD, BCPS  Babs BertinHaley [name] Pharm D Positive urine culture  [x]  Patient discharged without antimicrobial prescription and treatment is now indicated []  Organism is resistant to prescribed ED discharge antimicrobial []  Patient with positive blood cultures  Changes discussed with ED provider: Cheron SchaumannLeslie Sofia PA New antibiotic prescription Keflex 500 mg PO BID x 5 days Called to [facility] [phone]  Contacted patient, date 07/26/2017, time 1150   Yael Angerer, Linnell FullingRose Burnett 07/26/2017, 11:47 AM

## 2017-08-04 ENCOUNTER — Other Ambulatory Visit: Payer: Self-pay | Admitting: Family Medicine
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
## 2017-08-05 NOTE — Telephone Encounter (Signed)
Last seen 12/13/16

## 2017-08-06 ENCOUNTER — Other Ambulatory Visit: Payer: Self-pay | Admitting: *Deleted

## 2017-08-06 ENCOUNTER — Telehealth: Payer: Self-pay | Admitting: Family Medicine

## 2017-08-06 MED ORDER — ZOLPIDEM TARTRATE 10 MG PO TABS: ORAL_TABLET | ORAL | 2 refills | Status: DC

## 2017-08-06 NOTE — Telephone Encounter (Signed)
Med called into pharm. Pt notified.

## 2017-08-06 NOTE — Telephone Encounter (Signed)
May give this +2 refills will need office follow-up this fall

## 2017-08-06 NOTE — Telephone Encounter (Signed)
Requesting refill of Ambien to [facility]

## 2017-08-19 ENCOUNTER — Ambulatory Visit (HOSPITAL_COMMUNITY)
Admission: EM | Admit: 2017-08-19 | Discharge: 2017-08-19 | Disposition: A | Discharge status: Home / Self Care | Payer: Medicaid Other | Attending: Family Medicine | Admitting: Family Medicine

## 2017-08-19 ENCOUNTER — Encounter (HOSPITAL_COMMUNITY): Payer: Self-pay | Admitting: Emergency Medicine

## 2017-08-19 DIAGNOSIS — R3915 Urgency of urination: Secondary | ICD-10-CM | POA: Diagnosis not present

## 2017-08-19 DIAGNOSIS — R350 Frequency of micturition: Secondary | ICD-10-CM | POA: Diagnosis not present

## 2017-08-19 DIAGNOSIS — M5442 Lumbago with sciatica, left side: Secondary | ICD-10-CM

## 2017-08-19 DIAGNOSIS — K219 Gastro-esophageal reflux disease without esophagitis: Secondary | ICD-10-CM | POA: Insufficient documentation

## 2017-08-19 DIAGNOSIS — M545 Low back pain: Secondary | ICD-10-CM | POA: Diagnosis present

## 2017-08-19 LAB — POCT URINALYSIS DIP (DEVICE)
BILIRUBIN URINE: NEGATIVE
Glucose, UA: NEGATIVE mg/dL
HGB URINE DIPSTICK: NEGATIVE
Ketones, ur: NEGATIVE mg/dL
Leukocytes, UA: NEGATIVE
NITRITE: NEGATIVE
Protein, ur: NEGATIVE mg/dL
Specific Gravity, Urine: 1.015 (ref 1.005–1.030)
UROBILINOGEN UA: 1 mg/dL (ref 0.0–1.0)
pH: 7 (ref 5.0–8.0)

## 2017-08-19 MED ORDER — DICLOFENAC SODIUM 75 MG PO TBEC
75.0000 mg | DELAYED_RELEASE_TABLET | Freq: Two times a day (BID) | ORAL | 0 refills | Status: DC
Start: 2017-08-19 — End: 2017-08-29

## 2017-08-19 MED ORDER — METHOCARBAMOL 500 MG PO TABS: 500.0000 mg | ORAL_TABLET | Freq: Four times a day (QID) | ORAL | 0 refills | Status: DC | PRN

## 2017-08-19 NOTE — ED Triage Notes (Signed)
PT reports urinary frequency, burning, and lower back pain. PT was on antibiotics and finished 1 week ago.

## 2017-08-19 NOTE — Discharge Instructions (Signed)
Your urine does not show any signs of infection tonight, however we will send it to the lab for further testing. I think it is more likely that you have a back injury causing your pain. I recommend rest, ice alternated with heat on the affected area, I prescribed 2 medicines for pain, the first is diclofenac, one tablet twice today, and also Robaxin which is some muscle relaxer twice a day. This can cause drowsiness do not drive or operate heavy machinery while taking. If we do need to start on antibiotics we will contact you with the results of your urine culture and send a prescription to your pharmacy

## 2017-08-19 NOTE — ED Provider Notes (Signed)
[facility]   [id number] 08/19/17 Arrival Time: 1746   SUBJECTIVE:  Heather Moon is a 36 y.o. female who presents to the urgent care with complaint of midline lower back pain radiating down her left hip and leg. Along with urinary frequency, and urgency. As a history of recurrent UTIs, blue she has a kidney infection. Does lift large heavy objects at work, and slipped and fell one week ago. Does drink tea, and drink significant caffeine. No vaginal discharge, or odors, does have past history of hysterectomy.     Past Medical History:  Diagnosis Date  . Blood transfusion without reported diagnosis   . Cholelithiasis   . Chronic insomnia   . Constipation, chronic   . GERD (gastroesophageal reflux disease)    takes Prilosec as needed  . Kidney infection   . Migraine   . Migraine headache   . 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 UTI (lower urinary tract infection)    Social History   Social History  . Marital status: Divorced    Spouse name: N/A  . Number of children: 5  . Years of education: 10 th   Occupational History  .      Homemaker   Social History Main Topics  . Smoking status: Never Smoker  . Smokeless tobacco: Never Used  . Alcohol use No  . Drug use: No  . Sexual activity: Yes    Partners: Male    Birth control/ protection: Surgical   Other Topics Concern  . Not on file   Social History Narrative   Patient lives at home with her boyfriend. Patient is a homemaker. Patient has 10 th grade education.   Caffeine- sweet tea three cups daily.   Left handed.   No outpatient prescriptions have been marked as taking for the 08/19/17 encounter [facility] Encounter).   Allergies  Allergen Reactions  . Doxycycline Hives  . Hydrocodone Nausea And Vomiting  . Macrolides And Ketolides Nausea Only  . Trazodone And Nefazodone Other (See Comments)    Headaches      ROS: As per HPI, remainder of ROS negative.   OBJECTIVE:  Vitals:     08/19/17 1845  BP: 103/69  Pulse: (!) 51  Resp: 16  Temp: 98.3 F (36.8 C)  TempSrc: Oral  SpO2: 100%  Weight: 150 lb (68 kg)  Height: 5\' 4"  (1.626 m)       General Appearance:  awake, alert, oriented, in no acute distress, well developed, well nourished and in no acute distress Skin:  skin color, texture, turgor are normal Head/face:  NCAT Ears:  External- normal Back:  mild pain to palpation in the LS spine midline, Negative CVA Lungs:  Normal expansion.  Clear to auscultation.  No rales, rhonchi, or wheezing. Heart:  Heart regular rate and rhythm Abdomen:  Soft, non-tender, normal bowel sounds; no bruits, organomegaly or masses. Extremities: Extremities warm to touch, pink, with no edema. Peripheral Pulses:  Capillary refill <2secs, strong peripheral pulses Neurologic:  Alert and oriented     Labs:  Results for orders placed or performed during the hospital encounter of 08/19/17  POCT urinalysis dip (device)  Result Value Ref Range   Glucose, UA NEGATIVE NEGATIVE mg/dL   Bilirubin Urine NEGATIVE NEGATIVE   Ketones, ur NEGATIVE NEGATIVE mg/dL   Specific Gravity, Urine 1.015 1.005 - 1.030   Hgb urine dipstick NEGATIVE NEGATIVE   pH 7.0 5.0 - 8.0   Protein, ur  NEGATIVE NEGATIVE mg/dL   Urobilinogen, UA 1.0 0.0 - 1.0 mg/dL   Nitrite NEGATIVE NEGATIVE   Leukocytes, UA NEGATIVE NEGATIVE    Labs Reviewed  URINE CULTURE  POCT URINALYSIS DIP (DEVICE)    No results found.     ASSESSMENT & PLAN:  1. Acute left-sided low back pain with left-sided sciatica     Meds ordered this encounter  Medications  . methocarbamol (ROBAXIN) 500 MG tablet    Sig: Take 1 tablet (500 mg total) by mouth every 6 (six) hours as needed for muscle spasms.    Dispense:  40 tablet    Refill:  0    Order Specific Question:   Supervising Provider    Answer:   Mardella LaymanHAGLER, BRIAN [id number]]  . diclofenac (VOLTAREN) 75 MG EC tablet    Sig: Take 1 tablet (75 mg total) by mouth 2 (two)  times daily.    Dispense:  20 tablet    Refill:  0    Order Specific Question:   Supervising Provider    Answer:   Mardella LaymanHAGLER, BRIAN [id number]]   We'll send urine for culture to confirm, most likely diagnosis is lower back strain. Recommend refraining from caffeinated products, drink lots of water, treating with muscle relaxers and NSAIDs Reviewed expectations re: course of current medical issues. Questions answered. Outlined signs and symptoms indicating need for more acute intervention. Patient verbalized understanding. After Visit Summary given.    Procedures:        Dorena BodoKennard, December Hedtke, NP 08/19/17 2124

## 2017-08-21 LAB — URINE CULTURE

## 2017-08-29 ENCOUNTER — Ambulatory Visit (HOSPITAL_COMMUNITY)
Admission: EM | Admit: 2017-08-29 | Discharge: 2017-08-29 | Disposition: A | Discharge status: Home / Self Care | Payer: Medicaid Other | Attending: Family Medicine | Admitting: Family Medicine

## 2017-08-29 ENCOUNTER — Encounter (HOSPITAL_COMMUNITY): Payer: Self-pay | Admitting: Emergency Medicine

## 2017-08-29 DIAGNOSIS — M545 Low back pain: Secondary | ICD-10-CM | POA: Diagnosis not present

## 2017-08-29 DIAGNOSIS — R8290 Unspecified abnormal findings in urine: Secondary | ICD-10-CM | POA: Diagnosis not present

## 2017-08-29 DIAGNOSIS — R109 Unspecified abdominal pain: Secondary | ICD-10-CM | POA: Diagnosis not present

## 2017-08-29 LAB — POCT URINALYSIS DIP (DEVICE)
BILIRUBIN URINE: NEGATIVE
Glucose, UA: NEGATIVE mg/dL
Hgb urine dipstick: NEGATIVE
KETONES UR: NEGATIVE mg/dL
Nitrite: NEGATIVE
PH: 6.5 (ref 5.0–8.0)
Protein, ur: NEGATIVE mg/dL
Specific Gravity, Urine: 1.02 (ref 1.005–1.030)
Urobilinogen, UA: 0.2 mg/dL (ref 0.0–1.0)

## 2017-08-29 MED ORDER — KETOROLAC TROMETHAMINE 10 MG PO TABS: 10.0000 mg | ORAL_TABLET | Freq: Four times a day (QID) | ORAL | 0 refills | Status: DC | PRN

## 2017-08-29 NOTE — Discharge Instructions (Signed)
You may need an ultrasound, available in the emergency department or at the urology office.  In the meantime, toradol may give you significant relief.

## 2017-08-29 NOTE — ED Provider Notes (Signed)
[facility]   [id number] 08/29/17 Arrival Time: 1439   SUBJECTIVE:  Heather Moon is a 36 y.o. female who presents to the urgent care with complaint of back pain. Pt here for persistent back pain associated w/dark urine  Seen here on 9/4 for similar sx ... Urine culture came back negative  Sx also include chills  Denies fevers, v/d  A&O x4... NAD... Ambulatory   She has a h/o childhood ureteral surgery.  She is sure she has a kidney infection     Past Medical History:  Diagnosis Date  . Blood transfusion without reported diagnosis   . Cholelithiasis   . Chronic insomnia   . Constipation, chronic   . GERD (gastroesophageal reflux disease)    takes Prilosec as needed  . Kidney infection   . Migraine   . Migraine headache   . 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 UTI (lower urinary tract infection)    Family History  Problem Relation Age of Onset  . Cancer Maternal Grandmother   . Cirrhosis Maternal Grandfather    Social History   Social History  . Marital status: Divorced    Spouse name: N/A  . Number of children: 5  . Years of education: 10 th   Occupational History  .      Homemaker   Social History Main Topics  . Smoking status: Never Smoker  . Smokeless tobacco: Never Used  . Alcohol use No  . Drug use: No  . Sexual activity: Yes    Partners: Male    Birth control/ protection: Surgical   Other Topics Concern  . Not on file   Social History Narrative   Patient lives at home with her boyfriend. Patient is a homemaker. Patient has 10 th grade education.   Caffeine- sweet tea three cups daily.   Left handed.   No outpatient prescriptions have been marked as taking for the 08/29/17 encounter [facility] Encounter).   Allergies  Allergen Reactions  . Doxycycline Hives  . Hydrocodone Nausea And Vomiting  . Macrolides And Ketolides Nausea Only  . Trazodone And Nefazodone Other (See Comments)    Headaches       ROS: As per HPI, remainder of ROS negative.   OBJECTIVE:   Vitals:   08/29/17 1532  BP: 109/72  Pulse: (!) 55  Resp: 16  Temp: 98.5 F (36.9 C)  TempSrc: Oral  SpO2: 100%     General appearance: alert; no distress Eyes: PERRL; EOMI; conjunctiva normal HENT: normocephalic; atraumatic; TMs normal, canal normal, external ears normal without trauma; nasal mucosa normal; oral mucosa normal Neck: supple Lungs: clear to auscultation bilaterally Heart: regular rate and rhythm Abdomen: soft, non-tender; bowel sounds normal; no masses or organomegaly; no guarding or rebound tenderness Back: no CVA tenderness Extremities: no cyanosis or edema; symmetrical with no gross deformities Skin: warm and dry Neurologic: normal gait; grossly normal Psychological: alert and cooperative; normal mood and affect      Labs:  Results for orders placed or performed during the hospital encounter of 08/29/17  POCT urinalysis dip (device)  Result Value Ref Range   Glucose, UA NEGATIVE NEGATIVE mg/dL   Bilirubin Urine NEGATIVE NEGATIVE   Ketones, ur NEGATIVE NEGATIVE mg/dL   Specific Gravity, Urine 1.020 1.005 - 1.030   Hgb urine dipstick NEGATIVE NEGATIVE   pH 6.5 5.0 - 8.0   Protein, ur NEGATIVE NEGATIVE mg/dL   Urobilinogen, UA 0.2 0.0 - 1.0 mg/dL  Nitrite NEGATIVE NEGATIVE   Leukocytes, UA TRACE (A) NEGATIVE    Labs Reviewed  POCT URINALYSIS DIP (DEVICE) - Abnormal; Notable for the following:       Result Value   Leukocytes, UA TRACE (*)    All other components within normal limits    No results found.     ASSESSMENT & PLAN:  1. Acute right flank pain     Meds ordered this encounter  Medications  . ketorolac (TORADOL) 10 MG tablet    Sig: Take 1 tablet (10 mg total) by mouth every 6 (six) hours as needed.    Dispense:  20 tablet    Refill:  0    Reviewed expectations re: course of current medical issues. Questions answered. Outlined signs and symptoms  indicating need for more acute intervention. Patient verbalized understanding. After Visit Summary given.    Procedures:      Elvina Sidle, MD 08/29/17 [phone]

## 2017-08-29 NOTE — ED Triage Notes (Signed)
Pt here for persistent back pain associated w/dark urine  Seen here on 9/4 for similar sx ... Urine culture came back negative  Sx also include chills  Denies fevers, v/d  A&O x4... NAD... Ambulatory

## 2017-09-03 ENCOUNTER — Ambulatory Visit (INDEPENDENT_AMBULATORY_CARE_PROVIDER_SITE_OTHER): Payer: Medicaid Other | Admitting: Nurse Practitioner

## 2017-09-03 ENCOUNTER — Encounter: Payer: Self-pay | Admitting: Nurse Practitioner

## 2017-09-03 VITALS — BP 118/70 | Temp 98.3°F | Ht 64.0 in | Wt 150.0 lb

## 2017-09-03 DIAGNOSIS — N390 Urinary tract infection, site not specified: Secondary | ICD-10-CM

## 2017-09-03 LAB — POCT UA - MICROSCOPIC ONLY

## 2017-09-03 LAB — POCT URINALYSIS DIPSTICK
Spec Grav, UA: >=1.03 — AB (ref 1.010–1.025)
UROBILINOGEN UA: 4 U/dL — AB
pH, UA: 6 (ref 5.0–8.0)

## 2017-09-03 MED ORDER — CIPROFLOXACIN HCL 500 MG PO TABS: 500.0000 mg | ORAL_TABLET | Freq: Two times a day (BID) | ORAL | 0 refills | Status: DC

## 2017-09-03 NOTE — Patient Instructions (Addendum)
Take AZO for the first 2 days with antibiotics to help with symptoms.    Urinary Tract Infection, Adult A urinary tract infection (UTI) is an infection of any part of the urinary tract. The urinary tract includes the:  Kidneys.  Ureters.  Bladder.  Urethra.  These organs make, store, and get rid of pee (urine) in the body. Follow these instructions at home:  Take over-the-counter and prescription medicines only as told by your doctor.  If you were prescribed an antibiotic medicine, take it as told by your doctor. Do not stop taking the antibiotic even if you start to feel better.  Avoid the following drinks: ? Alcohol. ? Caffeine. ? Tea. ? Carbonated drinks.  Drink enough fluid to keep your pee clear or pale yellow.  Keep all follow-up visits as told by your doctor. This is important.  Make sure to: ? Empty your bladder often and completely. Do not to hold pee for long periods of time. ? Empty your bladder before and after sex. ? Wipe from front to back after a bowel movement if you are female. Use each tissue one time when you wipe. Contact a doctor if:  You have back pain.  You have a fever.  You feel sick to your stomach (nauseous).  You throw up (vomit).  Your symptoms do not get better after 3 days.  Your symptoms go away and then come back. Get help right away if:  You have very bad back pain.  You have very bad lower belly (abdominal) pain.  You are throwing up and cannot keep down any medicines or water. This information is not intended to replace advice given to you by your health care provider. Make sure you discuss any questions you have with your health care provider. Document Released: 05/20/2008 Document Revised: 05/09/2016 Document Reviewed: 10/23/2015 Elsevier Interactive Patient Education  [employer].

## 2017-09-03 NOTE — Progress Notes (Signed)
   Subjective:    Patient ID: Heather Moon, female    DOB: September 22, 1981, 36 y.o.   MRN: [id number]  HPI: 36 y/o female presents today with worsening urinary symptoms since 08/19/2017. Pt reports she went to Urgent Care in [place] when she first noticed symptoms on 9/4, but test results were negative for UTI. Pt states she returned to Urgent Care on 9/14 with worsening symptoms, with negative test results again. Pt reports dark, malodorous urine, left flank pain, dysuria, urinary frequency and urgency, chills, and suprapubic pain. She states she took AZO intermittently to help with symptoms, but reports this was ineffective. Pt denies blood in urine, vaginal discharge, new sexual partner, nausea, vomiting, or fever. Pt denies recent antibiotic use.     Review of Systems  Constitutional: Positive for chills. Negative for diaphoresis and fever.  Gastrointestinal: Positive for abdominal pain. Negative for abdominal distention, nausea and vomiting.  Genitourinary: Positive for dysuria, flank pain, frequency and urgency. Negative for decreased urine volume, hematuria, pelvic pain, vaginal discharge and vaginal pain.      Objective:   Physical Exam  Constitutional: She appears well-developed and well-nourished.  Cardiovascular: Normal rate and regular rhythm.   Pulmonary/Chest: Effort normal and breath sounds normal.  Abdominal: Soft. There is tenderness in the suprapubic area. There is CVA tenderness.  Skin: Skin is warm and dry.  Psychiatric: She has a normal mood and affect. Her behavior is normal.  Vitals reviewed.  Results for orders placed or performed in visit on 09/03/17  POCT urinalysis dipstick  Result Value Ref Range   Color, UA     Clarity, UA     Glucose, UA     Bilirubin, UA ++    Ketones, UA     Spec Grav, UA >=1.030 (A) 1.010 - 1.025   Blood, UA     pH, UA 6.0 5.0 - 8.0   Protein, UA     Urobilinogen, UA 4.0 (A) 0.2 or 1.0 E.U./dL   Nitrite, UA +    Leukocytes, UA Small  (1+) (A) Negative  POCT UA - Microscopic Only  Result Value Ref Range   WBC, Ur, HPF, POC 10+    RBC, urine, microscopic rare    Bacteria, U Microscopic none    Mucus, UA     Epithelial cells, urine per micros few    Crystals, Ur, HPF, POC     Casts, Ur, LPF, POC     Yeast, UA         Assessment & Plan:  1. Acute UTI - POCT urinalysis dipstick - POCT UA - Microscopic Only - Pt educated on taking full course of antibiotics - Pt educated on taking AZO for the first 2 days of taking antibiotics to help with symptoms - Pt educated on drinking more fluids  Meds ordered this encounter  Medications  . ciprofloxacin (CIPRO) 500 MG tablet    Sig: Take 1 tablet (500 mg total) by mouth 2 (two) times daily.    Dispense:  14 tablet    Refill:  0    Order Specific Question:   Supervising Provider    Answer:   [name] [[id number]]   Return if symptoms worsen or fail to improve.

## 2017-09-04 ENCOUNTER — Encounter: Payer: Self-pay | Admitting: Nurse Practitioner

## 2017-09-04 ENCOUNTER — Other Ambulatory Visit: Payer: Self-pay | Admitting: Psychiatry

## 2017-09-04 DIAGNOSIS — M542 Cervicalgia: Secondary | ICD-10-CM

## 2017-09-05 ENCOUNTER — Ambulatory Visit
Admission: RE | Admit: 2017-09-05 | Discharge: 2017-09-05 | Disposition: A | Discharge status: Home / Self Care | Payer: Medicaid Other | Source: Ambulatory Visit | Attending: Psychiatry | Admitting: Psychiatry

## 2017-09-05 DIAGNOSIS — M542 Cervicalgia: Secondary | ICD-10-CM

## 2017-09-09 ENCOUNTER — Telehealth: Payer: Self-pay | Admitting: Family Medicine

## 2017-09-09 MED ORDER — CEFPROZIL 500 MG PO TABS: 500.0000 mg | ORAL_TABLET | Freq: Two times a day (BID) | ORAL | 0 refills | Status: DC

## 2017-09-09 NOTE — Telephone Encounter (Signed)
Given that this medication did not work I recommend Cefzil 500 mg 1 twice a day for the next 7 days

## 2017-09-09 NOTE — Telephone Encounter (Signed)
Prescription sent electronically to pharmacy. Patient notified. 

## 2017-09-09 NOTE — Telephone Encounter (Signed)
Message for Heather Moon-Patient was seen last week with a UTI and antibiotic Cipro 500 not working still having burning when she pees and wanting something else [facility]  [facility]

## 2017-10-07 ENCOUNTER — Encounter (HOSPITAL_COMMUNITY): Payer: Self-pay | Admitting: Emergency Medicine

## 2017-10-07 ENCOUNTER — Ambulatory Visit (HOSPITAL_COMMUNITY)
Admission: EM | Admit: 2017-10-07 | Discharge: 2017-10-07 | Disposition: A | Discharge status: Home / Self Care | Payer: Medicaid Other | Attending: Emergency Medicine | Admitting: Emergency Medicine

## 2017-10-07 DIAGNOSIS — Z881 Allergy status to other antibiotic agents status: Secondary | ICD-10-CM | POA: Diagnosis not present

## 2017-10-07 DIAGNOSIS — Z90710 Acquired absence of both cervix and uterus: Secondary | ICD-10-CM | POA: Diagnosis not present

## 2017-10-07 DIAGNOSIS — Z8489 Family history of other specified conditions: Secondary | ICD-10-CM | POA: Insufficient documentation

## 2017-10-07 DIAGNOSIS — Z9049 Acquired absence of other specified parts of digestive tract: Secondary | ICD-10-CM | POA: Diagnosis not present

## 2017-10-07 DIAGNOSIS — R300 Dysuria: Secondary | ICD-10-CM | POA: Diagnosis present

## 2017-10-07 DIAGNOSIS — Z885 Allergy status to narcotic agent status: Secondary | ICD-10-CM | POA: Insufficient documentation

## 2017-10-07 DIAGNOSIS — Z888 Allergy status to other drugs, medicaments and biological substances status: Secondary | ICD-10-CM | POA: Diagnosis not present

## 2017-10-07 DIAGNOSIS — Z791 Long term (current) use of non-steroidal anti-inflammatories (NSAID): Secondary | ICD-10-CM | POA: Insufficient documentation

## 2017-10-07 DIAGNOSIS — Z79899 Other long term (current) drug therapy: Secondary | ICD-10-CM | POA: Insufficient documentation

## 2017-10-07 DIAGNOSIS — N3001 Acute cystitis with hematuria: Secondary | ICD-10-CM | POA: Diagnosis not present

## 2017-10-07 DIAGNOSIS — R3915 Urgency of urination: Secondary | ICD-10-CM | POA: Insufficient documentation

## 2017-10-07 DIAGNOSIS — K219 Gastro-esophageal reflux disease without esophagitis: Secondary | ICD-10-CM | POA: Insufficient documentation

## 2017-10-07 DIAGNOSIS — Z9889 Other specified postprocedural states: Secondary | ICD-10-CM | POA: Insufficient documentation

## 2017-10-07 DIAGNOSIS — Z809 Family history of malignant neoplasm, unspecified: Secondary | ICD-10-CM | POA: Insufficient documentation

## 2017-10-07 LAB — POCT URINALYSIS DIP (DEVICE)
Bilirubin Urine: NEGATIVE
Glucose, UA: NEGATIVE mg/dL
Ketones, ur: NEGATIVE mg/dL
NITRITE: NEGATIVE
PH: 6.5 (ref 5.0–8.0)
PROTEIN: NEGATIVE mg/dL
Specific Gravity, Urine: 1.015 (ref 1.005–1.030)
Urobilinogen, UA: 0.2 mg/dL (ref 0.0–1.0)

## 2017-10-07 MED ORDER — CEPHALEXIN 500 MG PO CAPS: 500.0000 mg | ORAL_CAPSULE | Freq: Three times a day (TID) | ORAL | 0 refills | Status: DC

## 2017-10-07 MED ORDER — PHENAZOPYRIDINE HCL 200 MG PO TABS: 200.0000 mg | ORAL_TABLET | Freq: Three times a day (TID) | ORAL | 0 refills | Status: DC | PRN

## 2017-10-07 MED ORDER — IBUPROFEN 600 MG PO TABS: 600.0000 mg | ORAL_TABLET | Freq: Four times a day (QID) | ORAL | 0 refills | Status: DC | PRN

## 2017-10-07 NOTE — ED Provider Notes (Signed)
HPI  SUBJECTIVE:  Heather Moon is a 36 y.o. female who presents with 3-5 days of dysuria, urgency, frequency, cloudy and odorous urine. She reports constant achy left-sided back pain "at her kidney". She tried Azo, Tylenol and ibuprofen. Azo helps. Symptoms are worse with urination. She denies hematuria, fevers, nausea, vomiting, abdominal or pelvic pain. No vaginal bleeding, odor, discharge, genital rash or itching. She is in a long-term monogamous relationship with a female who is asymptomatic. STDs are not a concern today. No recent antibiotics, perfumed soaps or body washes. She has taken an antipyretic within 6-8 hours of evaluation. She has a past medical history of Escherichia coli UTI that was pansensitive to all antibiotics, pyelonephritis, she is status post a renal artery stent and has a history of kidney disease. Not sure what kind exactly. No history of nephrolithiasis, diabetes, hypertension. LMP: Status post hysterectomy. PMD: Dr. Macie Burows. Urology: In [place].    Past Medical History:  Diagnosis Date  . Blood transfusion without reported diagnosis   . Cholelithiasis   . Chronic insomnia   . Constipation, chronic   . GERD (gastroesophageal reflux disease)    takes Prilosec as needed  . Kidney infection   . Migraine   . Migraine headache   . 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 UTI (lower urinary tract infection)     Past Surgical History:  Procedure Laterality Date  . ABDOMINAL HYSTERECTOMY  08/27/2010   supracervical, blood transfusion  . CHOLECYSTECTOMY  01/11/2013   Procedure: LAPAROSCOPIC CHOLECYSTECTOMY;  Surgeon: Shelly Rubenstein, MD;  Location: [facility];  Service: General;  Laterality: N/A;  Laparoscopic cholecystectomy  . EYE SURGERY Left    at 6mos old  . RENAL ARTERY STENT      Family History  Problem Relation Age of Onset  . Cancer Maternal Grandmother   . Cirrhosis Maternal Grandfather     Social History   Substance Use Topics  . Smoking status: Never Smoker  . Smokeless tobacco: Never Used  . Alcohol use No    No current facility-administered medications for this encounter.   Current Outpatient Prescriptions:  .  acyclovir (ZOVIRAX) 400 MG tablet, TAKE 1 TABLET BY MOUTH 4 TIMES A DAY FOR 5 DAYS, Disp: 20 tablet, Rfl: 2 .  cefPROZIL (CEFZIL) 500 MG tablet, Take 1 tablet (500 mg total) by mouth 2 (two) times daily., Disp: 14 tablet, Rfl: 0 .  cephALEXin (KEFLEX) 500 MG capsule, Take 1 capsule (500 mg total) by mouth 3 (three) times daily. X 14 days, Disp: 42 capsule, Rfl: 0 .  ibuprofen (ADVIL,MOTRIN) 600 MG tablet, Take 1 tablet (600 mg total) by mouth every 6 (six) hours as needed., Disp: 30 tablet, Rfl: 0 .  ketorolac (TORADOL) 10 MG tablet, Take 1 tablet (10 mg total) by mouth every 6 (six) hours as needed., Disp: 20 tablet, Rfl: 0 .  phenazopyridine (PYRIDIUM) 200 MG tablet, Take 1 tablet (200 mg total) by mouth 3 (three) times daily as needed for pain., Disp: 6 tablet, Rfl: 0 .  zolpidem (AMBIEN) 10 MG tablet, Take 1 tablet (10 mg total) by mouth at bedtime as needed for sleep., Disp: 30 tablet, Rfl: 4  Allergies  Allergen Reactions  . Doxycycline Hives  . Hydrocodone Nausea And Vomiting  . Macrolides And Ketolides Nausea Only  . Trazodone And Nefazodone Other (See Comments)    Headaches     ROS  As noted in HPI.  Physical Exam  BP 113/74 (BP Location: Right Arm)   Pulse 72   Temp 99 F (37.2 C) (Oral)   Resp 18   SpO2 100%   Constitutional: Well developed, well nourished, no acute distress Eyes:  EOMI, conjunctiva normal bilaterally HENT: Normocephalic, atraumatic,mucus membranes moist Respiratory: Normal inspiratory effort Cardiovascular: Normal rate GI: nondistended. Positive suprapubic and left flank tenderness Back: Left CVA tenderness skin: No rash, skin intact Musculoskeletal: no deformities Neurologic: Alert & oriented x 3, no focal neuro  deficits Psychiatric: Speech and behavior appropriate   ED Course   Medications - No data to display  Orders Placed This Encounter  Procedures  . Urine culture    Standing Status:   Standing    Number of Occurrences:   1    Order Specific Question:   Patient immune status    Answer:   Normal  . POCT urinalysis dip (device)    Standing Status:   Standing    Number of Occurrences:   1    Results for orders placed or performed during the hospital encounter of 10/07/17 (from the past 24 hour(s))  POCT urinalysis dip (device)     Status: Abnormal   Collection Time: 10/07/17  8:28 PM  Result Value Ref Range   Glucose, UA NEGATIVE NEGATIVE mg/dL   Bilirubin Urine NEGATIVE NEGATIVE   Ketones, ur NEGATIVE NEGATIVE mg/dL   Specific Gravity, Urine 1.015 1.005 - 1.030   Hgb urine dipstick TRACE (A) NEGATIVE   pH 6.5 5.0 - 8.0   Protein, ur NEGATIVE NEGATIVE mg/dL   Urobilinogen, UA 0.2 0.0 - 1.0 mg/dL   Nitrite NEGATIVE NEGATIVE   Leukocytes, UA MODERATE (A) NEGATIVE   No results found.  ED Clinical Impression  Acute cystitis with hematuria   ED Assessment/Plan  Patient with moderate leukocytes, trace hematuria. Presentation consistent with UTI. Home with Keflex, Pyridium, ibuprofen. We'll send urine off for culture to confirm antibiotic choice. Follow-up with PMD as needed, also providing information for [facility] for follow-up if needed. To the ER if she gets worse.  Discussed labs, MDM, plan and followup with patient Discussed sn/sx that should prompt return to the ED. Patient agrees with plan.   Meds ordered this encounter  Medications  . DISCONTD: phenazopyridine (PYRIDIUM) 200 MG tablet    Sig: Take 1 tablet (200 mg total) by mouth 3 (three) times daily as needed for pain.    Dispense:  6 tablet    Refill:  0  . DISCONTD: cephALEXin (KEFLEX) 500 MG capsule    Sig: Take 1 capsule (500 mg total) by mouth 3 (three) times daily. X 14 days    Dispense:  42 capsule     Refill:  0  . DISCONTD: ibuprofen (ADVIL,MOTRIN) 600 MG tablet    Sig: Take 1 tablet (600 mg total) by mouth every 6 (six) hours as needed.    Dispense:  30 tablet    Refill:  0  . cephALEXin (KEFLEX) 500 MG capsule    Sig: Take 1 capsule (500 mg total) by mouth 3 (three) times daily. X 14 days    Dispense:  42 capsule    Refill:  0  . ibuprofen (ADVIL,MOTRIN) 600 MG tablet    Sig: Take 1 tablet (600 mg total) by mouth every 6 (six) hours as needed.    Dispense:  30 tablet    Refill:  0  . phenazopyridine (PYRIDIUM) 200 MG tablet    Sig: Take 1 tablet (200 mg  total) by mouth 3 (three) times daily as needed for pain.    Dispense:  6 tablet    Refill:  0    *This clinic note was created using Scientist, clinical (histocompatibility and immunogenetics). Therefore, there may be occasional mistakes despite careful proofreading.  ?   Domenick Gong, MD 10/08/17 [phone]

## 2017-10-07 NOTE — ED Triage Notes (Signed)
Pt sts UTI sx with pain in left flank area also; pt sts UTI sx frequently

## 2017-10-10 ENCOUNTER — Telehealth (HOSPITAL_COMMUNITY): Payer: Self-pay | Admitting: Internal Medicine

## 2017-10-10 ENCOUNTER — Telehealth (HOSPITAL_COMMUNITY): Payer: Self-pay | Admitting: *Deleted

## 2017-10-10 LAB — URINE CULTURE
Culture: >=100000 — AB
SPECIAL REQUESTS: NORMAL

## 2017-10-10 MED ORDER — SULFAMETHOXAZOLE-TRIMETHOPRIM 800-160 MG PO TABS: 1.0000 | ORAL_TABLET | Freq: Two times a day (BID) | ORAL | 0 refills | Status: AC

## 2017-10-10 MED ORDER — SULFAMETHOXAZOLE-TRIMETHOPRIM 800-160 MG PO TABS: 1.0000 | ORAL_TABLET | Freq: Two times a day (BID) | ORAL | 0 refills | Status: DC

## 2017-10-10 NOTE — Telephone Encounter (Signed)
Clinical staff, please let patient know that urine culture was positive for E coli germ resistant to cephalexin rx given at the urgent care visit 10/23 but sensitive to trimethoprim/sulfa.  Stop cephalexin.  Rx trimethoprim/sulfa [facility] to [facility] [place] [address]  Recheck or followup with PCP for further evaluation if symptoms are not improving.  LM

## 2017-10-11 ENCOUNTER — Telehealth: Payer: Self-pay

## 2017-10-11 NOTE — Telephone Encounter (Signed)
Post ED Visit - Positive Culture Follow-up  Culture report reviewed by antimicrobial stewardship pharmacist:  []  Enzo BiNathan Batchelder, Pharm.D. []  Celedonio MiyamotoJeremy Frens, Pharm.D., BCPS AQ-ID []  Garvin FilaMike Maccia, Pharm.D., BCPS []  Georgina PillionElizabeth Martin, Pharm.D., BCPS []  Reed CityMinh Pham, VermontPharm.D., BCPS, AAHIVP []  Estella HuskMichelle Turner, Pharm.D., BCPS, AAHIVP []  Lysle Pearlachel Rumbarger, PharmD, BCPS []  Casilda Carlsaylor Stone, PharmD, BCPS []  Pollyann SamplesAndy Johnston, PharmD, BCPS [facility] Hospital[facility] Mancheril Pharm D Positive urine culture Treated with Bactrim, organism sensitive to the same and no further patient follow-up is required at this time.  Jerry CarasCullom, Bryann Gentz Burnett 10/11/2017, 12:15 PM

## 2017-10-31 ENCOUNTER — Telehealth: Payer: Self-pay | Admitting: Family Medicine

## 2017-10-31 ENCOUNTER — Other Ambulatory Visit: Payer: Self-pay | Admitting: Family Medicine

## 2017-10-31 NOTE — Telephone Encounter (Signed)
[facility] [facility] [place] [facility] [address].  She said she is about to be out on Monday.

## 2017-10-31 NOTE — Telephone Encounter (Signed)
May have this +4 refills 

## 2017-10-31 NOTE — Telephone Encounter (Signed)
Spoke with patient and informed her per Dr.Scott  Luking- refills we sent in on Ambien. Patient verbalized understanding.

## 2017-11-03 ENCOUNTER — Telehealth: Payer: Self-pay | Admitting: Family Medicine

## 2017-11-03 NOTE — Telephone Encounter (Signed)
Requesting status to prior authorization for Ambien.

## 2017-11-04 NOTE — Telephone Encounter (Signed)
Submitted PA awaiting response

## 2017-11-05 ENCOUNTER — Telehealth: Payer: Self-pay | Admitting: Family Medicine

## 2017-11-05 NOTE — Telephone Encounter (Signed)
error 

## 2017-11-17 ENCOUNTER — Telehealth: Payer: Self-pay | Admitting: Family Medicine

## 2017-11-17 NOTE — Telephone Encounter (Signed)
Patient is requesting a nurse to call her back regarding her prior authorization for her Ambien. She said she has been waiting since 11/04/17 for this.

## 2017-11-17 NOTE — Telephone Encounter (Signed)
Spoke with patient and informed her that I resubmitted Prior Authorization today. Patient verbalized understanding.

## 2017-11-21 ENCOUNTER — Telehealth: Payer: Self-pay | Admitting: Family Medicine

## 2017-11-21 NOTE — Telephone Encounter (Signed)
Called [place] [facility] to check status of Rx PA  Pt's zolpidem (AMBIEN) 10 MG tablet was APPROVED Auth# [id number] & is valid 11/17/17-05/16/18

## 2017-12-14 ENCOUNTER — Emergency Department (HOSPITAL_COMMUNITY)
Admission: EM | Admit: 2017-12-14 | Discharge: 2017-12-14 | Disposition: A | Discharge status: Home / Self Care | Payer: Medicaid Other | Attending: Emergency Medicine | Admitting: Emergency Medicine

## 2017-12-14 ENCOUNTER — Other Ambulatory Visit: Payer: Self-pay

## 2017-12-14 ENCOUNTER — Encounter (HOSPITAL_COMMUNITY): Payer: Self-pay | Admitting: Emergency Medicine

## 2017-12-14 DIAGNOSIS — M545 Low back pain, unspecified: Secondary | ICD-10-CM

## 2017-12-14 DIAGNOSIS — J029 Acute pharyngitis, unspecified: Secondary | ICD-10-CM | POA: Diagnosis not present

## 2017-12-14 DIAGNOSIS — Z79899 Other long term (current) drug therapy: Secondary | ICD-10-CM | POA: Insufficient documentation

## 2017-12-14 LAB — URINALYSIS, ROUTINE W REFLEX MICROSCOPIC
BILIRUBIN URINE: NEGATIVE
GLUCOSE, UA: NEGATIVE mg/dL
HGB URINE DIPSTICK: NEGATIVE
Ketones, ur: NEGATIVE mg/dL
NITRITE: NEGATIVE
PROTEIN: NEGATIVE mg/dL
Specific Gravity, Urine: 1.008 (ref 1.005–1.030)
pH: 6 (ref 5.0–8.0)

## 2017-12-14 LAB — RAPID STREP SCREEN (MED CTR MEBANE ONLY): Streptococcus, Group A Screen (Direct): NEGATIVE

## 2017-12-14 NOTE — ED Triage Notes (Signed)
Pt c/o sore throat and low back pain, HA x 2 days. Pt states her grandson recently had strep

## 2017-12-14 NOTE — ED Notes (Signed)
Pt in Peds with daughter.  

## 2017-12-14 NOTE — ED Provider Notes (Signed)
[facility] EMERGENCY DEPARTMENT Provider Note   CSN: [id number] Arrival date & time: 12/14/17  2030     History   Chief Complaint Chief Complaint  Patient presents with  . Sore Throat    HPI Heather Moon is a 36 y.o. female.  Patient presents with complaint of sore throat, low grade fever and low back pain. No urinary symptoms. She reports her grandson was visiting over the holidays and was diagnosed with strep. She is here with multiple family members with similar symptoms. No nausea or vomiting, abdominal pain or significant cough. She report mild congestion. No rash.    The history is provided by the patient. No language interpreter was used.    Past Medical History:  Diagnosis Date  . Blood transfusion without reported diagnosis   . Cholelithiasis   . Chronic insomnia   . Constipation, chronic   . GERD (gastroesophageal reflux disease)    takes Prilosec as needed  . Kidney infection   . Migraine   . Migraine headache   . 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 UTI (lower urinary tract infection)     Patient Active Problem List   Diagnosis Date Noted  . Chronic pain of right knee 12/13/2016  . Traumatic hematoma of right knee 12/13/2016  . Chronic constipation 11/18/2016  . Generalized abdominal pain 11/18/2016  . Rectal bleeding 11/18/2016  . Anxiety 09/20/2016  . History of hysterectomy, supracervical 09/19/2016  . Insomnia 10/20/2015  . Herniated cervical disc 03/24/2013  . Muscle spasms of head and/or neck 03/05/2013  . Left shoulder pain 03/05/2013  . Migraine headache without aura 03/05/2013    Past Surgical History:  Procedure Laterality Date  . ABDOMINAL HYSTERECTOMY  08/27/2010   supracervical, blood transfusion  . CHOLECYSTECTOMY  01/11/2013   Procedure: LAPAROSCOPIC CHOLECYSTECTOMY;  Surgeon: Shelly Rubenstein, MD;  Location: [facility];  Service: General;  Laterality: N/A;  Laparoscopic  cholecystectomy  . EYE SURGERY Left    at 6mos old  . RENAL ARTERY STENT      OB History    No data available       Home Medications    Prior to Admission medications   Medication Sig Start Date End Date Taking? Authorizing Provider  acyclovir (ZOVIRAX) 400 MG tablet TAKE 1 TABLET BY MOUTH 4 TIMES A DAY FOR 5 DAYS 07/23/17   Campbell Riches, NP  cefPROZIL (CEFZIL) 500 MG tablet Take 1 tablet (500 mg total) by mouth 2 (two) times daily. 09/09/17   [name], MD  cephALEXin (KEFLEX) 500 MG capsule Take 1 capsule (500 mg total) by mouth 3 (three) times daily. X 14 days 10/07/17   Domenick Gong, MD  ibuprofen (ADVIL,MOTRIN) 600 MG tablet Take 1 tablet (600 mg total) by mouth every 6 (six) hours as needed. 10/07/17   Domenick Gong, MD  ketorolac (TORADOL) 10 MG tablet Take 1 tablet (10 mg total) by mouth every 6 (six) hours as needed. 08/29/17   Elvina Sidle, MD  phenazopyridine (PYRIDIUM) 200 MG tablet Take 1 tablet (200 mg total) by mouth 3 (three) times daily as needed for pain. 10/07/17   Domenick Gong, MD  zolpidem (AMBIEN) 10 MG tablet TAKE 1 TABLET AT BEDTIME AS NEEDED FOR SLEEP 10/31/17   [name], MD    Family History Family History  Problem Relation Age of Onset  . Cancer Maternal Grandmother   . Cirrhosis Maternal Grandfather  Social History Social History   Tobacco Use  . Smoking status: Never Smoker  . Smokeless tobacco: Never Used  Substance Use Topics  . Alcohol use: No  . Drug use: No     Allergies   Doxycycline; Hydrocodone; Macrolides and ketolides; and Trazodone and nefazodone   Review of Systems Review of Systems  Constitutional: Positive for chills and fever.  HENT: Positive for congestion and sore throat. Negative for trouble swallowing.   Respiratory: Negative.  Negative for shortness of breath.   Cardiovascular: Negative.  Negative for chest pain.  Gastrointestinal: Negative.  Negative for abdominal pain, nausea  and vomiting.  Musculoskeletal: Negative.   Skin: Negative.  Negative for rash.  Neurological: Negative.      Physical Exam Updated Vital Signs BP 110/72 (BP Location: Left Arm)   Pulse 60   Temp 98.4 F (36.9 C) (Oral)   Resp 18   Ht 5\' 4"  (1.626 m)   Wt 68 kg (150 lb)   SpO2 100%   BMI 25.75 kg/m   Physical Exam  Constitutional: She appears well-developed and well-nourished.  HENT:  Head: Normocephalic.  Mouth/Throat: Uvula is midline, oropharynx is clear and moist and mucous membranes are normal. No oropharyngeal exudate or posterior oropharyngeal erythema.  Neck: Normal range of motion. Neck supple.  Cardiovascular: Normal rate and regular rhythm.  Pulmonary/Chest: Effort normal and breath sounds normal.  Abdominal: Soft. Bowel sounds are normal. There is no tenderness. There is no rebound and no guarding.  Musculoskeletal: Normal range of motion.  Lymphadenopathy:    She has no cervical adenopathy.  Neurological: She is alert. No cranial nerve deficit.  Skin: Skin is warm and dry. No rash noted.  Psychiatric: She has a normal mood and affect.     ED Treatments / Results  Labs (all labs ordered are listed, but only abnormal results are displayed) Labs Reviewed  URINALYSIS, ROUTINE W REFLEX MICROSCOPIC - Abnormal; Notable for the following components:      Result Value   Leukocytes, UA TRACE (*)    Bacteria, UA RARE (*)    Squamous Epithelial / LPF 0-5 (*)    All other components within normal limits  RAPID STREP SCREEN (NOT AT [facility])  CULTURE, GROUP A STREP [facility])   Results for orders placed or performed during the hospital encounter of 12/14/17  Rapid strep screen  Result Value Ref Range   Streptococcus, Group A Screen (Direct) NEGATIVE NEGATIVE  Urinalysis, Routine w reflex microscopic  Result Value Ref Range   Color, Urine YELLOW YELLOW   APPearance CLEAR CLEAR   Specific Gravity, Urine 1.008 1.005 - 1.030   pH 6.0 5.0 - 8.0   Glucose, UA NEGATIVE  NEGATIVE mg/dL   Hgb urine dipstick NEGATIVE NEGATIVE   Bilirubin Urine NEGATIVE NEGATIVE   Ketones, ur NEGATIVE NEGATIVE mg/dL   Protein, ur NEGATIVE NEGATIVE mg/dL   Nitrite NEGATIVE NEGATIVE   Leukocytes, UA TRACE (A) NEGATIVE   RBC / HPF 0-5 0 - 5 RBC/hpf   WBC, UA 0-5 0 - 5 WBC/hpf   Bacteria, UA RARE (A) NONE SEEN   Squamous Epithelial / LPF 0-5 (A) NONE SEEN   Mucus PRESENT     EKG  EKG Interpretation None       Radiology No results found.  Procedures Procedures (including critical care time)  Medications Ordered in ED Medications - No data to display   Initial Impression / Assessment and Plan / ED Course  I have reviewed the triage vital  signs and the nursing notes.  Pertinent labs & imaging results that were available during my care of the patient were reviewed by me and considered in my medical decision making (see chart for details).     Patient with symptoms of ST, congestion. Low back pain and recent exposure to strep. She is here for concern of infection.  Tests here are negative for strep and UTI. Suspect viral illness with multiple family members affected.   Final Clinical Impressions(s) / ED Diagnoses   Final diagnoses:  None  1. URI 2. Pharyngitis   ED Discharge Orders    None       Danne HarborUpstill, Jayd Forrey, PA-C 12/14/17 2334    Niel HummerKuhner, Ross, MD 12/15/17 1154

## 2017-12-15 ENCOUNTER — Ambulatory Visit (INDEPENDENT_AMBULATORY_CARE_PROVIDER_SITE_OTHER): Payer: Medicaid Other | Admitting: Family Medicine

## 2017-12-15 ENCOUNTER — Encounter: Payer: Self-pay | Admitting: Family Medicine

## 2017-12-15 VITALS — BP 108/70 | Temp 98.3°F | Ht 64.0 in | Wt 155.0 lb

## 2017-12-15 DIAGNOSIS — J329 Chronic sinusitis, unspecified: Secondary | ICD-10-CM | POA: Diagnosis not present

## 2017-12-15 MED ORDER — AMOXICILLIN 500 MG PO CAPS: 500.0000 mg | ORAL_CAPSULE | Freq: Three times a day (TID) | ORAL | 0 refills | Status: DC

## 2017-12-15 NOTE — Progress Notes (Signed)
   Subjective:    Patient ID: Heather Moon, female    DOB: [birth year], 36 y.o.   MRN: [id number]  Sinusitis  This is a new problem. Episode onset: 4 days. Associated symptoms include congestion, coughing, headaches and a sore throat. (Fever) Treatments tried: ibuprofen, nyquil.   Croupy raspy bad cough  Dim energy   Dim appetite  Sore throat achey all over, low gr fe er , cold chills    H a frotal worse with cough  ibu ptn tyl prn   Pops prod gunky discharge  frpontal sinus h a worse with coughing    Review of Systems  HENT: Positive for congestion and sore throat.   Respiratory: Positive for cough.   Neurological: Positive for headaches.       Objective:   Physical Exam   Alert, mild malaise. Hydration good Vitals stable. frontal/ maxillary tenderness evident positive nasal congestion. pharynx normal neck supple  lungs clear/no crackles or wheezes. heart regular in rhythm      Assessment & Plan:  Impression rhinosinusitis likely post viral, discussed with patient. plan antibiotics prescribed. Questions answered. Symptomatic care discussed. warning signs discussed. WSL

## 2017-12-17 LAB — CULTURE, GROUP A STREP (THRC)

## 2018-01-09 ENCOUNTER — Encounter: Payer: Self-pay | Admitting: Family Medicine

## 2018-01-09 ENCOUNTER — Encounter: Payer: Self-pay | Admitting: Nurse Practitioner

## 2018-01-09 ENCOUNTER — Ambulatory Visit: Payer: Medicaid Other | Admitting: Nurse Practitioner

## 2018-01-09 VITALS — BP 102/78 | Temp 97.6°F | Ht 64.0 in | Wt 156.0 lb

## 2018-01-09 DIAGNOSIS — R300 Dysuria: Secondary | ICD-10-CM

## 2018-01-09 DIAGNOSIS — J111 Influenza due to unidentified influenza virus with other respiratory manifestations: Secondary | ICD-10-CM

## 2018-01-09 DIAGNOSIS — N3946 Mixed incontinence: Secondary | ICD-10-CM | POA: Diagnosis not present

## 2018-01-09 DIAGNOSIS — J069 Acute upper respiratory infection, unspecified: Secondary | ICD-10-CM

## 2018-01-09 LAB — POCT URINALYSIS DIPSTICK
RBC UA: NEGATIVE
SPEC GRAV UA: 1.02 (ref 1.010–1.025)
pH, UA: 5 (ref 5.0–8.0)

## 2018-01-09 MED ORDER — CEFDINIR 300 MG PO CAPS: 300.0000 mg | ORAL_CAPSULE | Freq: Two times a day (BID) | ORAL | 0 refills | Status: DC

## 2018-01-12 ENCOUNTER — Encounter: Payer: Self-pay | Admitting: Nurse Practitioner

## 2018-01-12 DIAGNOSIS — N3946 Mixed incontinence: Secondary | ICD-10-CM | POA: Insufficient documentation

## 2018-01-12 NOTE — Progress Notes (Signed)
Subjective: Presents for complaints of cold and flu symptoms that began 3 days ago.  Fever and chills.  Sore throat.  Headache.  Runny nose.  Some cough.  Fatigue.  Muscle aches.  Nausea but no vomiting.  No diarrhea.  No abdominal pain.  Has had some urinary frequency with urgency.  Has had urinary issues since her hysterectomy.  Slight incontinence at times.  Taking fluids well.  No dysuria.  Same sexual partner.  No vaginal discharge.  History of renal artery stent.  Has not had this evaluated in years.  Objective:   BP 102/78   Temp 97.6 F (36.4 C) (Oral)   Ht 5\' 4"  (1.626 m)   Wt 156 lb (70.8 kg)   BMI 26.78 kg/m  NAD.  Alert, oriented.  TMs clear effusion, no erythema.  Pharynx injected with PND noted.  Neck supple with mild soft anterior adenopathy.  Lungs clear.  Heart regular rate and rhythm.  Abdomen soft nondistended with minimal suprapubic area discomfort on exam.  Mild right CVA tenderness. Results for orders placed or performed in visit on 01/09/18  POCT urinalysis dipstick  Result Value Ref Range   Color, UA     Clarity, UA     Glucose, UA     Bilirubin, UA     Ketones, UA     Spec Grav, UA 1.020 1.010 - 1.025   Blood, UA negative    pH, UA 5.0 5.0 - 8.0   Protein, UA     Urobilinogen, UA  0.2 or 1.0 E.U./dL   Nitrite, UA     Leukocytes, UA Trace (A) Negative   Appearance     Odor     Urine micro negative.  Assessment:  Problem List Items Addressed This Visit      Other   Mixed stress and urge urinary incontinence    Other Visit Diagnoses    Influenza    -  Primary   Relevant Medications   cefdinir (OMNICEF) 300 MG capsule   Dysuria       Relevant Orders   POCT urinalysis dipstick (Completed)   Acute upper respiratory infection       Relevant Medications   cefdinir (OMNICEF) 300 MG capsule       Plan:   Meds ordered this encounter  Medications  . cefdinir (OMNICEF) 300 MG capsule    Sig: Take 1 capsule (300 mg total) by mouth 2 (two) times daily.     Dispense:  20 capsule    Refill:  0    Order Specific Question:   Supervising Provider    Answer:   Merlyn AlbertLUKING, WILLIAM S [[id number]]   Treat with Omnicef to cover upper respiratory infection and dysuria.  Will refer to urology for further evaluation.  Call back next week if no improvement in her symptoms, sooner if worse.  Warning signs were reviewed. 25 minutes was spent with the patient. Greater than half the time was spent in discussion and answering questions and counseling regarding the issues that the patient came in for today.

## 2018-01-14 ENCOUNTER — Encounter: Payer: Self-pay | Admitting: Family Medicine

## 2018-02-17 DIAGNOSIS — N3281 Overactive bladder: Secondary | ICD-10-CM | POA: Diagnosis not present

## 2018-02-17 DIAGNOSIS — R1084 Generalized abdominal pain: Secondary | ICD-10-CM | POA: Diagnosis not present

## 2018-03-08 ENCOUNTER — Other Ambulatory Visit: Payer: Self-pay | Admitting: Family Medicine

## 2018-03-09 NOTE — Telephone Encounter (Signed)
Ok plus 3 ref 

## 2018-05-24 ENCOUNTER — Encounter (HOSPITAL_COMMUNITY): Payer: Self-pay | Admitting: Emergency Medicine

## 2018-05-24 ENCOUNTER — Ambulatory Visit (HOSPITAL_COMMUNITY)
Admission: EM | Admit: 2018-05-24 | Discharge: 2018-05-24 | Disposition: A | Discharge status: Home / Self Care | Payer: Medicaid Other | Attending: Family Medicine | Admitting: Family Medicine

## 2018-05-24 DIAGNOSIS — Z881 Allergy status to other antibiotic agents status: Secondary | ICD-10-CM | POA: Diagnosis not present

## 2018-05-24 DIAGNOSIS — F419 Anxiety disorder, unspecified: Secondary | ICD-10-CM | POA: Diagnosis not present

## 2018-05-24 DIAGNOSIS — S8001XA Contusion of right knee, initial encounter: Secondary | ICD-10-CM | POA: Diagnosis not present

## 2018-05-24 DIAGNOSIS — M545 Low back pain: Secondary | ICD-10-CM | POA: Diagnosis present

## 2018-05-24 DIAGNOSIS — Z885 Allergy status to narcotic agent status: Secondary | ICD-10-CM | POA: Diagnosis not present

## 2018-05-24 DIAGNOSIS — K219 Gastro-esophageal reflux disease without esophagitis: Secondary | ICD-10-CM | POA: Diagnosis not present

## 2018-05-24 DIAGNOSIS — K625 Hemorrhage of anus and rectum: Secondary | ICD-10-CM | POA: Diagnosis not present

## 2018-05-24 DIAGNOSIS — N3090 Cystitis, unspecified without hematuria: Secondary | ICD-10-CM | POA: Diagnosis not present

## 2018-05-24 DIAGNOSIS — K5909 Other constipation: Secondary | ICD-10-CM | POA: Diagnosis not present

## 2018-05-24 DIAGNOSIS — R300 Dysuria: Secondary | ICD-10-CM | POA: Diagnosis present

## 2018-05-24 DIAGNOSIS — N3946 Mixed incontinence: Secondary | ICD-10-CM | POA: Diagnosis not present

## 2018-05-24 DIAGNOSIS — G43009 Migraine without aura, not intractable, without status migrainosus: Secondary | ICD-10-CM | POA: Diagnosis not present

## 2018-05-24 DIAGNOSIS — Z9049 Acquired absence of other specified parts of digestive tract: Secondary | ICD-10-CM | POA: Diagnosis not present

## 2018-05-24 DIAGNOSIS — R1084 Generalized abdominal pain: Secondary | ICD-10-CM | POA: Insufficient documentation

## 2018-05-24 DIAGNOSIS — N390 Urinary tract infection, site not specified: Secondary | ICD-10-CM | POA: Diagnosis not present

## 2018-05-24 DIAGNOSIS — G4700 Insomnia, unspecified: Secondary | ICD-10-CM | POA: Insufficient documentation

## 2018-05-24 DIAGNOSIS — Z90710 Acquired absence of both cervix and uterus: Secondary | ICD-10-CM | POA: Insufficient documentation

## 2018-05-24 MED ORDER — CEPHALEXIN 500 MG PO CAPS: 500.0000 mg | ORAL_CAPSULE | Freq: Two times a day (BID) | ORAL | 0 refills | Status: AC

## 2018-05-24 NOTE — ED Provider Notes (Signed)
[facility]    CSN: [id number] Arrival date & time: 05/24/18  1800     History   Chief Complaint Chief Complaint  Patient presents with  . Dysuria    HPI Heather Moon is a 37 y.o. female.   37 year old female comes in with 5 day history of urinary symptoms.  Has had dysuria, frequency, urgency.  Denies hematuria.  Low back pain that is dull.  Denies abdominal pain, nausea, vomiting.  No known fevers, but has felt chills.  Denies vaginal symptoms such as discharge, itching, pain.  ACL without relief.  Denies new hygiene products.      Past Medical History:  Diagnosis Date  . Blood transfusion without reported diagnosis   . Cholelithiasis   . Chronic insomnia   . Constipation, chronic   . GERD (gastroesophageal reflux disease)    takes Prilosec as needed  . Kidney infection   . Migraine   . Migraine headache   . 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 UTI (lower urinary tract infection)     Patient Active Problem List   Diagnosis Date Noted  . 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 Chronic pain of right knee 12/13/2016  . Traumatic hematoma of right knee 12/13/2016  . Chronic constipation 11/18/2016  . Generalized abdominal pain 11/18/2016  . Rectal bleeding 11/18/2016  . Anxiety 09/20/2016  . History of hysterectomy, supracervical 09/19/2016  . Insomnia 10/20/2015  . Herniated cervical disc 03/24/2013  . Muscle spasms of head and/or neck 03/05/2013  . Left shoulder pain 03/05/2013  . Migraine headache without aura 03/05/2013    Past Surgical History:  Procedure Laterality Date  . ABDOMINAL HYSTERECTOMY  08/27/2010   supracervical, blood transfusion  . CHOLECYSTECTOMY  01/11/2013   Procedure: LAPAROSCOPIC CHOLECYSTECTOMY;  Surgeon: Shelly Rubenstein, MD;  Location: [facility];  Service: General;  Laterality: N/A;  Laparoscopic cholecystectomy  . EYE SURGERY Left    at 6mos old  . RENAL ARTERY STENT       OB History   None      Home Medications    Prior to Admission medications   Medication Sig Start Date End Date Taking? Authorizing Provider  acyclovir (ZOVIRAX) 400 MG tablet TAKE 1 TABLET BY MOUTH 4 TIMES A DAY FOR 5 DAYS 07/23/17   Campbell Riches, NP  cephALEXin (KEFLEX) 500 MG capsule Take 1 capsule (500 mg total) by mouth 2 (two) times daily for 7 days. 05/24/18 05/31/18  Belinda Fisher, PA-C  zolpidem (AMBIEN) 10 MG tablet TAKE 1 TABLET AT BEDTIME AS NEEDED FOR SLEEP 03/10/18   [name], MD    Family History Family History  Problem Relation Age of Onset  . Cancer Maternal Grandmother   . Cirrhosis Maternal Grandfather     Social History Social History   Tobacco Use  . Smoking status: Never Smoker  . Smokeless tobacco: Never Used  Substance Use Topics  . Alcohol use: No  . Drug use: No     Allergies   Doxycycline; Hydrocodone; Macrolides and ketolides; and Trazodone and nefazodone   Review of Systems Review of Systems  Reason unable to perform ROS: See HPI as above.     Physical Exam Triage Vital Signs ED Triage Vitals [05/24/18 1833]  Enc Vitals Group     BP 109/81     Pulse Rate (!) 55     Resp 16  Temp 98.2 F (36.8 C)     Temp Source Oral     SpO2 100 %     Weight      Height      Head Circumference      Peak Flow      Pain Score      Pain Loc      Pain Edu?      Excl. in GC?    No data found.  Updated Vital Signs BP 109/81 (BP Location: Left Arm)   Pulse (!) 55   Temp 98.2 F (36.8 C) (Oral)   Resp 16   SpO2 100%   Physical Exam  Constitutional: She is oriented to person, place, and time. She appears well-developed and well-nourished. No distress.  HENT:  Head: Normocephalic and atraumatic.  Eyes: Pupils are equal, round, and reactive to light. Conjunctivae are normal.  Cardiovascular: Normal rate, regular rhythm and normal heart sounds. Exam reveals no gallop and no friction rub.  No murmur heard. Pulmonary/Chest:  Effort normal and breath sounds normal. She has no wheezes. She has no rales.  Abdominal: Soft. Bowel sounds are normal. She exhibits no mass. There is no tenderness. There is no rebound, no guarding and no CVA tenderness.  Neurological: She is alert and oriented to person, place, and time.  Skin: Skin is warm and dry.  Psychiatric: She has a normal mood and affect. Her behavior is normal. Judgment normal.     UC Treatments / Results  Labs (all labs ordered are listed, but only abnormal results are displayed) Labs Reviewed  URINE CULTURE    Mod leuks Neg nitrites Normal uro 30 protein PH 6.0 Blood moderate Specific gravity 1.025 Neg ketones Small bili Neg glucose  EKG None  Radiology No results found.  Procedures Procedures (including critical care time)  Medications Ordered in UC Medications - No data to display  Initial Impression / Assessment and Plan / UC Course  I have reviewed the triage vital signs and the nursing notes.  Pertinent labs & imaging results that were available during my care of the patient were reviewed by me and considered in my medical decision making (see chart for details).    Urine dipstick positive for UTI. Start antibiotics as directed. Push fluids. Return precautions given.  Final Clinical Impressions(s) / UC Diagnoses   Final diagnoses:  Cystitis    ED Prescriptions    Medication Sig Dispense Auth. Provider   cephALEXin (KEFLEX) 500 MG capsule Take 1 capsule (500 mg total) by mouth 2 (two) times daily for 7 days. 14 capsule Threasa AlphaYu, Arbadella Kimbler V, PA-C        Serigne Kubicek V, [place] JerseyPA-C 05/24/18 1902

## 2018-05-24 NOTE — Discharge Instructions (Signed)
Your urine was positive for an urinary tract infection. Start keflex as directed. Keep hydrated, your urine should be clear to pale yellow in color. Monitor for any worsening of symptoms, fever, worsening abdominal pain, nausea/vomiting, flank pain, follow up for reevaluation.  ° °

## 2018-05-24 NOTE — ED Triage Notes (Signed)
Pt states "I know I have a kidney infection, burns when I pee".

## 2018-05-25 ENCOUNTER — Telehealth: Payer: Self-pay | Admitting: Family Medicine

## 2018-05-25 NOTE — Telephone Encounter (Signed)
PA started for Ambien. Form in provider office to be signed.

## 2018-05-27 ENCOUNTER — Telehealth: Payer: Self-pay | Admitting: Family Medicine

## 2018-05-27 LAB — URINE CULTURE: Culture: >=100000 — AB

## 2018-05-27 NOTE — Telephone Encounter (Signed)
Patient is calling to check on prior authorization for Ambien.

## 2018-05-27 NOTE — Telephone Encounter (Signed)
Explained to patient  that we were short a nurse and we were unable to get to it at this time. Explained to patient that we would get this done as soon as we can. Pt verbalized understanding

## 2018-05-28 ENCOUNTER — Telehealth: Payer: Self-pay

## 2018-05-28 NOTE — Telephone Encounter (Signed)
Prior auth for Ambien in your box.

## 2018-05-29 ENCOUNTER — Telehealth (HOSPITAL_COMMUNITY): Payer: Self-pay

## 2018-05-29 NOTE — Telephone Encounter (Signed)
Urine culture was positive for Enterobacter Aerogenes and was given Keflex at urgent care visit. Pt contacted and made aware, educated on completing antibiotic and to follow up if symptoms are persistent. Verbalized understanding.

## 2018-05-29 NOTE — Telephone Encounter (Signed)
Form was faxed. Await response

## 2018-05-29 NOTE — Telephone Encounter (Signed)
This was signed thank you 

## 2018-05-29 NOTE — Telephone Encounter (Signed)
This was signed.

## 2018-06-03 ENCOUNTER — Telehealth: Payer: Self-pay

## 2018-06-03 NOTE — Telephone Encounter (Signed)
Per [facility] pt picked up the Ambien on 05/30/2018.

## 2018-06-05 ENCOUNTER — Telehealth: Payer: Self-pay

## 2018-06-05 NOTE — Telephone Encounter (Signed)
[facility] 10 mg # 30 on 05/30/2018 and paid $3.00.

## 2018-07-01 ENCOUNTER — Telehealth: Payer: Self-pay | Admitting: Family Medicine

## 2018-07-01 NOTE — Telephone Encounter (Signed)
Redone PA for Ambien. Fax from 06/02/18 stating that the last PA was denied due to the quantity left out. Filled out another form, in providers office. Contacted patient to let her know and informed her that we will call her when we know if it was approved or denied. Pt verbalized understanding.

## 2018-07-01 NOTE — Telephone Encounter (Signed)
Hopefully the prior approval will work thank you

## 2018-07-26 ENCOUNTER — Other Ambulatory Visit: Payer: Self-pay | Admitting: Family Medicine

## 2018-07-27 NOTE — Telephone Encounter (Signed)
May have refills, this +3 refills

## 2018-08-21 ENCOUNTER — Other Ambulatory Visit: Payer: Self-pay

## 2018-08-21 ENCOUNTER — Emergency Department (HOSPITAL_COMMUNITY)
Admission: EM | Admit: 2018-08-21 | Discharge: 2018-08-21 | Disposition: A | Discharge status: Home / Self Care | Payer: Medicaid Other | Attending: Emergency Medicine | Admitting: Emergency Medicine

## 2018-08-21 ENCOUNTER — Encounter (HOSPITAL_COMMUNITY): Payer: Self-pay | Admitting: Emergency Medicine

## 2018-08-21 ENCOUNTER — Emergency Department (HOSPITAL_COMMUNITY): Payer: Medicaid Other

## 2018-08-21 DIAGNOSIS — Z79899 Other long term (current) drug therapy: Secondary | ICD-10-CM | POA: Insufficient documentation

## 2018-08-21 DIAGNOSIS — S59912A Unspecified injury of left forearm, initial encounter: Secondary | ICD-10-CM | POA: Diagnosis not present

## 2018-08-21 DIAGNOSIS — S6992XA Unspecified injury of left wrist, hand and finger(s), initial encounter: Secondary | ICD-10-CM | POA: Diagnosis not present

## 2018-08-21 DIAGNOSIS — M25532 Pain in left wrist: Secondary | ICD-10-CM | POA: Insufficient documentation

## 2018-08-21 DIAGNOSIS — M79632 Pain in left forearm: Secondary | ICD-10-CM | POA: Diagnosis not present

## 2018-08-21 MED ORDER — OXYCODONE-ACETAMINOPHEN 5-325 MG PO TABS
1.0000 | ORAL_TABLET | Freq: Once | ORAL | Status: AC
  Administered 2018-08-21: 1 via ORAL
  Filled 2018-08-21: qty 1

## 2018-08-21 NOTE — ED Notes (Signed)
See EDP assessment 

## 2018-08-21 NOTE — ED Provider Notes (Signed)
Patient placed in Quick Look pathway, seen and evaluated   Chief Complaint: Left wrist pain  HPI:   Patient lifting fence poles yesterday when her significant other dropped his side and they fell on her left wrist.  She says that since then she has had increasing pain in her left wrist.  It hurts to even touch the wrist.  Denies any history of trauma to this wrist.  ROS: no fever Physical Exam:   Gen: Appears uncomfortable.   Neuro: Awake and Alert  Skin: Warm    Focused Exam: Capillary refill to left fingers.  Bruising over the wrist.   Initiation of care has begun. The patient has been counseled on the process, plan, and necessity for staying for the completion/evaluation, and the remainder of the medical screening examination    Norman Clay 08/21/18 2056    Margarita Grizzle, MD 08/24/18 2026

## 2018-08-21 NOTE — Discharge Instructions (Addendum)
Your x-rays today were normal. There are no broken bones or dislocations.   You may use Tylenol (up to 650mg  in one dose) and/or Ibuprofen (up to 800mg  in one dose) for pain relief and swelling. I recommend that you do this 3-4 times a day for the next 2-3 days. You may also use cold compresses for additional relief in 15-20 minute intervals.  Follow-up with your PCP if you continue to have issues for more than 4-6 weeks.

## 2018-08-21 NOTE — ED Triage Notes (Signed)
Reports hurting left wrist yesterday trying to move fence posts.  Some bruising noted.

## 2018-08-21 NOTE — ED Provider Notes (Signed)
[facility] EMERGENCY DEPARTMENT Provider Note  CSN: [id number] Arrival date & time: 08/21/18  2032  History   Chief Complaint Chief Complaint  Patient presents with  . Wrist Pain    HPI Heather Moon is a 37 y.o. female who presented to the ED for left wrist pain x2 days. She states that she had picket fencing accidentally fall on her left wrist while her and her husband were loading it into the truck yesterday 08/20/18. Patient is left hand dominant. She describes constant, throbbing pain that is worse with movement and radiates to her elbow. She states that pain significantly worsened overnight. Patient has tried ibuprofen 800mg  x1 prior to coming to the ED. Denies deformity, paresthesias, weakness, color or temperature change in the left hand.  Past Medical History:  Diagnosis Date  . Blood transfusion without reported diagnosis   . Cholelithiasis   . Chronic insomnia   . Constipation, chronic   . GERD (gastroesophageal reflux disease)    takes Prilosec as needed  . Kidney infection   . Migraine   . Migraine headache   . 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 UTI (lower urinary tract infection)     Patient Active Problem List   Diagnosis Date Noted  . 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 Chronic pain of right knee 12/13/2016  . Traumatic hematoma of right knee 12/13/2016  . Chronic constipation 11/18/2016  . Generalized abdominal pain 11/18/2016  . Rectal bleeding 11/18/2016  . Anxiety 09/20/2016  . History of hysterectomy, supracervical 09/19/2016  . Insomnia 10/20/2015  . Herniated cervical disc 03/24/2013  . Muscle spasms of head and/or neck 03/05/2013  . Left shoulder pain 03/05/2013  . Migraine headache without aura 03/05/2013    Past Surgical History:  Procedure Laterality Date  . ABDOMINAL HYSTERECTOMY  08/27/2010   supracervical, blood transfusion  . CHOLECYSTECTOMY  01/11/2013   Procedure: LAPAROSCOPIC  CHOLECYSTECTOMY;  Surgeon: Shelly Rubenstein, MD;  Location: [facility];  Service: General;  Laterality: N/A;  Laparoscopic cholecystectomy  . EYE SURGERY Left    at 6mos old  . RENAL ARTERY STENT       OB History   None      Home Medications    Prior to Admission medications   Medication Sig Start Date End Date Taking? Authorizing Provider  acyclovir (ZOVIRAX) 400 MG tablet TAKE 1 TABLET BY MOUTH 4 TIMES A DAY FOR 5 DAYS 07/23/17   Campbell Riches, NP  zolpidem (AMBIEN) 10 MG tablet TAKE 1 TABLET AT BEDTIME AS NEEDED FOR SLEEP 07/27/18   [name], MD    Family History Family History  Problem Relation Age of Onset  . Cancer Maternal Grandmother   . Cirrhosis Maternal Grandfather     Social History Social History   Tobacco Use  . Smoking status: Never Smoker  . Smokeless tobacco: Never Used  Substance Use Topics  . Alcohol use: No  . Drug use: No     Allergies   Doxycycline; Hydrocodone; Macrolides and ketolides; and Trazodone and nefazodone   Review of Systems Review of Systems  Constitutional: Negative.   Musculoskeletal: Positive for arthralgias. Negative for joint swelling.  Skin: Negative for color change and pallor.  Neurological: Negative for weakness and numbness.  Psychiatric/Behavioral: Negative.    Physical Exam Updated Vital Signs BP 138/88 (BP Location: Right Arm)   Pulse 78   Temp 99.1 F (37.3  C) (Oral)   Resp 18   Ht 5\' 4"  (1.626 m)   Wt 70.3 kg   SpO2 100%   BMI 26.61 kg/m   Physical Exam  Constitutional: She appears well-developed and well-nourished.  Cardiovascular:  Pulses:      Radial pulses are 2+ on the right side, and 2+ on the left side.  Musculoskeletal:       Left wrist: She exhibits tenderness. She exhibits normal range of motion, no bony tenderness, no swelling and no deformity.       Arms:      Left hand: Normal. She exhibits normal capillary refill and no deformity. Normal sensation noted.  Normal strength noted.  Neurological: She has normal strength. No sensory deficit. She exhibits normal muscle tone.  Reflex Scores:      Bicep reflexes are 2+ on the right side and 2+ on the left side.      Brachioradialis reflexes are 2+ on the right side and 2+ on the left side. Skin: Skin is warm. Capillary refill takes less than 2 seconds.  Nursing note and vitals reviewed.    ED Treatments / Results  Labs (all labs ordered are listed, but only abnormal results are displayed) Labs Reviewed - No data to display  EKG None  Radiology Dg Forearm Left  Result Date: 08/21/2018 CLINICAL DATA:  Injury to the wrist and forearm with pain EXAM: LEFT FOREARM - 2 VIEW COMPARISON:  07/23/2017 FINDINGS: There is no evidence of fracture or other focal bone lesions. Soft tissues are unremarkable. IMPRESSION: Negative. Electronically Signed   By: Jasmine Pang M.D.   On: 08/21/2018 21:23   Dg Wrist Complete Left  Result Date: 08/21/2018 CLINICAL DATA:  Injury, painful EXAM: LEFT WRIST - COMPLETE 3+ VIEW COMPARISON:  07/23/2017 FINDINGS: There is no evidence of fracture or dislocation. There is no evidence of arthropathy or other focal bone abnormality. Soft tissues are unremarkable. IMPRESSION: Negative. Electronically Signed   By: Jasmine Pang M.D.   On: 08/21/2018 21:23    Procedures Procedures (including critical care time)  Medications Ordered in ED Medications  oxyCODONE-acetaminophen (PERCOCET/ROXICET) 5-325 MG per tablet 1 tablet (1 tablet Oral Given 08/21/18 2053)     Initial Impression / Assessment and Plan / ED Course  Triage vital signs and the nursing notes have been reviewed.  Pertinent labs & imaging results that were available during care of the patient were reviewed and considered in medical decision making (see chart for details).  Patient is in no distress and well appearing. Patient has full sensation in left hand. She also has full active and passive ROM, but endorses  pain with wrist flexion. Most pain that patient endorses muscular tenderness on the anterior aspect of the wrist. No deformities, decreased muscle tone or other abnormalities visualized. Neurovascular function is intact. Physical exam and x-rays are reassuring. There are no other physical exam findings or s/s that suggest an underlying infectious or rheumatologic process that warrant further evaluation or intervention today.  Clinical Course as of Aug 21 2310  Fri Aug 21, 2018  2220 Wrist and forearm x-rays normal. No fractures or dislocations.   [GM]    Clinical Course User Index [GM] Denna Fryberger, Sharyon Medicus, PA-C   Final Clinical Impressions(s) / ED Diagnoses  1. Left Wrist Pain. Education provided on OTC and supportive treatment for pain relief and inflammation. Wrist brace given in the ED for support and comfort. Advised to follow-up with PCP if she continues to have issues for  more than 4 weeks.  Dispo: Home. After thorough clinical evaluation, this patient is determined to be medically stable and can be safely discharged with the previously mentioned treatment and/or outpatient follow-up/referral(s). At this time, there are no other apparent medical conditions that require further screening, evaluation or treatment.   Final diagnoses:  Acute pain of left wrist    ED Discharge Orders    None        Reva Bores 08/21/18 2311    Maia Plan, MD 08/22/18 [id number]

## 2018-08-21 NOTE — ED Notes (Signed)
Pt verbalizes understanding of d/c instructions. Pt ambulatory at d/c with all belongings and with family.   

## 2018-09-06 ENCOUNTER — Encounter (HOSPITAL_COMMUNITY): Payer: Self-pay | Admitting: Emergency Medicine

## 2018-09-06 ENCOUNTER — Other Ambulatory Visit: Payer: Self-pay

## 2018-09-06 ENCOUNTER — Emergency Department (HOSPITAL_COMMUNITY)
Admission: EM | Admit: 2018-09-06 | Discharge: 2018-09-06 | Disposition: A | Discharge status: Home / Self Care | Payer: Medicaid Other | Attending: Emergency Medicine | Admitting: Emergency Medicine

## 2018-09-06 DIAGNOSIS — K0889 Other specified disorders of teeth and supporting structures: Secondary | ICD-10-CM

## 2018-09-06 DIAGNOSIS — Y9289 Other specified places as the place of occurrence of the external cause: Secondary | ICD-10-CM | POA: Insufficient documentation

## 2018-09-06 DIAGNOSIS — Y9389 Activity, other specified: Secondary | ICD-10-CM | POA: Insufficient documentation

## 2018-09-06 DIAGNOSIS — S025XXA Fracture of tooth (traumatic), initial encounter for closed fracture: Secondary | ICD-10-CM

## 2018-09-06 DIAGNOSIS — Y998 Other external cause status: Secondary | ICD-10-CM | POA: Insufficient documentation

## 2018-09-06 DIAGNOSIS — W228XXA Striking against or struck by other objects, initial encounter: Secondary | ICD-10-CM | POA: Diagnosis not present

## 2018-09-06 MED ORDER — AMOXICILLIN 500 MG PO CAPS: 500.0000 mg | ORAL_CAPSULE | Freq: Three times a day (TID) | ORAL | 0 refills | Status: DC

## 2018-09-06 MED ORDER — NAPROXEN 375 MG PO TABS: 375.0000 mg | ORAL_TABLET | Freq: Two times a day (BID) | ORAL | 0 refills | Status: DC

## 2018-09-06 MED ORDER — ONDANSETRON 4 MG PO TBDP: 4.0000 mg | ORAL_TABLET | Freq: Once | ORAL | Status: DC

## 2018-09-06 MED ORDER — AMOXICILLIN 500 MG PO CAPS
500.0000 mg | ORAL_CAPSULE | Freq: Once | ORAL | Status: AC
  Administered 2018-09-06: 500 mg via ORAL
  Filled 2018-09-06: qty 1

## 2018-09-06 MED ORDER — TRAMADOL HCL 50 MG PO TABS
50.0000 mg | ORAL_TABLET | Freq: Once | ORAL | Status: AC
  Administered 2018-09-06: 50 mg via ORAL
  Filled 2018-09-06: qty 1

## 2018-09-06 MED ORDER — ONDANSETRON 4 MG PO TBDP
4.0000 mg | ORAL_TABLET | Freq: Once | ORAL | Status: AC
  Administered 2018-09-06: 4 mg via ORAL
  Filled 2018-09-06: qty 1

## 2018-09-06 NOTE — ED Provider Notes (Signed)
[facility] EMERGENCY DEPARTMENT Provider Note   CSN: [id number] Arrival date & time: 09/06/18  1926     History   Chief Complaint Chief Complaint  Patient presents with  . Dental Pain    HPI Heather Moon is a 37 y.o. female who presents to the ED with dental pain. Pt states she bit some ice 2 weeks ago and back tooth chipped. States she currently doesn't have a dentist, and pain in getting unbearable.   HPI  Past Medical History:  Diagnosis Date  . Blood transfusion without reported diagnosis   . Cholelithiasis   . Chronic insomnia   . Constipation, chronic   . GERD (gastroesophageal reflux disease)    takes Prilosec as needed  . Kidney infection   . Migraine   . Migraine headache   . 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 UTI (lower urinary tract infection)     Patient Active Problem List   Diagnosis Date Noted  . 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 Chronic pain of right knee 12/13/2016  . Traumatic hematoma of right knee 12/13/2016  . Chronic constipation 11/18/2016  . Generalized abdominal pain 11/18/2016  . Rectal bleeding 11/18/2016  . Anxiety 09/20/2016  . History of hysterectomy, supracervical 09/19/2016  . Insomnia 10/20/2015  . Herniated cervical disc 03/24/2013  . Muscle spasms of head and/or neck 03/05/2013  . Left shoulder pain 03/05/2013  . Migraine headache without aura 03/05/2013    Past Surgical History:  Procedure Laterality Date  . ABDOMINAL HYSTERECTOMY  08/27/2010   supracervical, blood transfusion  . CHOLECYSTECTOMY  01/11/2013   Procedure: LAPAROSCOPIC CHOLECYSTECTOMY;  Surgeon: Shelly Rubenstein, MD;  Location: [facility];  Service: General;  Laterality: N/A;  Laparoscopic cholecystectomy  . EYE SURGERY Left    at 6mos old  . RENAL ARTERY STENT       OB History   None      Home Medications    Prior to Admission medications   Medication Sig Start Date End  Date Taking? Authorizing Provider  acyclovir (ZOVIRAX) 400 MG tablet TAKE 1 TABLET BY MOUTH 4 TIMES A DAY FOR 5 DAYS 07/23/17   Campbell Riches, NP  amoxicillin (AMOXIL) 500 MG capsule Take 1 capsule (500 mg total) by mouth 3 (three) times daily. 09/06/18   Janne Napoleon, NP  naproxen (NAPROSYN) 375 MG tablet Take 1 tablet (375 mg total) by mouth 2 (two) times daily. 09/06/18   Janne Napoleon, NP  zolpidem (AMBIEN) 10 MG tablet TAKE 1 TABLET AT BEDTIME AS NEEDED FOR SLEEP 07/27/18   [name], MD    Family History Family History  Problem Relation Age of Onset  . Cancer Maternal Grandmother   . Cirrhosis Maternal Grandfather     Social History Social History   Tobacco Use  . Smoking status: Never Smoker  . Smokeless tobacco: Never Used  Substance Use Topics  . Alcohol use: No  . Drug use: No     Allergies   Doxycycline; Hydrocodone; Macrolides and ketolides; and Trazodone and nefazodone   Review of Systems Review of Systems  HENT: Positive for dental problem.   Hematological: Positive for adenopathy.  All other systems reviewed and are negative.    Physical Exam Updated Vital Signs Pulse 61   Temp 98.8 F (37.1 C) (Oral)   Resp 16   Wt 70 kg   SpO2 100%  BMI 26.49 kg/m   Physical Exam  Constitutional: She appears well-developed and well-nourished. No distress.  HENT:  Head: Normocephalic.  Right Ear: Tympanic membrane normal.  Left Ear: Tympanic membrane normal.  Nose: Nose normal.  Mouth/Throat: Uvula is midline and oropharynx is clear and moist.    Left lower second molar broken. Gum surrounding the tooth with erythema and swelling. Tender on exam.  Eyes: EOM are normal.  Neck: Neck supple.  Pulmonary/Chest: Effort normal.  Musculoskeletal: Normal range of motion.  Neurological: She is alert.  Skin: Skin is warm and dry.  Psychiatric: She has a normal mood and affect.  Nursing note and vitals reviewed.    ED Treatments / Results  Labs (all  labs ordered are listed, but only abnormal results are displayed) Labs Reviewed - No data to display  EKG None  Radiology No results found.  Procedures Procedures (including critical care time)  Medications Ordered in ED Medications  ondansetron (ZOFRAN-ODT) disintegrating tablet 4 mg (has no administration in time range)  traMADol (ULTRAM) tablet 50 mg (has no administration in time range)  amoxicillin (AMOXIL) capsule 500 mg (has no administration in time range)     Initial Impression / Assessment and Plan / ED Course  I have reviewed the triage vital signs and the nursing notes. Patient with toothache.  No gross abscess.  Exam unconcerning for Ludwig's angina or spread of infection.  Will treat with Amoxicillin and anti-inflammatories medicine.  Urged patient to follow-up with dentist.  Resource referral given.  Final Clinical Impressions(s) / ED Diagnoses   Final diagnoses:  Closed fracture of tooth, initial encounter  Pain, dental    ED Discharge Orders         Ordered    amoxicillin (AMOXIL) 500 MG capsule  3 times daily     09/06/18 1959    naproxen (NAPROSYN) 375 MG tablet  2 times daily     09/06/18 1959           Kerrie Buffaloeese, Hope CarmanM, [place]NP 09/06/18 2003    Sabas SousBero, Michael M, MD 09/06/18 2315

## 2018-09-06 NOTE — ED Triage Notes (Signed)
Pt states she bit some ice 2 weeks ago and back tooth chipped. States she currently doesn't have a dentist, and pain in getting unbearable.

## 2018-09-06 NOTE — ED Notes (Signed)
Patient verbalizes understanding of discharge instructions. Opportunity for questioning and answers were provided. Armband removed by staff, pt discharged from ED.  

## 2018-09-11 DIAGNOSIS — H40033 Anatomical narrow angle, bilateral: Secondary | ICD-10-CM | POA: Diagnosis not present

## 2018-09-11 DIAGNOSIS — H16223 Keratoconjunctivitis sicca, not specified as Sjogren's, bilateral: Secondary | ICD-10-CM | POA: Diagnosis not present

## 2018-09-12 DIAGNOSIS — H5213 Myopia, bilateral: Secondary | ICD-10-CM | POA: Diagnosis not present

## 2018-10-06 ENCOUNTER — Telehealth: Payer: Self-pay | Admitting: Family Medicine

## 2018-10-06 MED ORDER — PENICILLIN V POTASSIUM 500 MG PO TABS: 500.0000 mg | ORAL_TABLET | Freq: Four times a day (QID) | ORAL | 0 refills | Status: AC

## 2018-10-06 NOTE — Telephone Encounter (Signed)
I called and spoke with the pt she is aware of all and that medication was sent in to [facility].

## 2018-10-06 NOTE — Telephone Encounter (Signed)
Patient states has a bad tooth and needing antibiotic until she can find a dentist to pull tooth.She wanting a referral who takes medicaid she states has tried in [place] but had no luck and needing to get this pull soon as possible states very painful.

## 2018-10-06 NOTE — Telephone Encounter (Signed)
Penicillin VK 500 mg 1 4 times daily for 7 days I am not certain of any particular dentist that accepts Medicaid I recommend the patient continue to call around Nurses if you are familiar with any dentist that takes Medicaid please the patient know

## 2018-10-06 NOTE — Telephone Encounter (Signed)
Please advise thank you

## 2018-10-20 ENCOUNTER — Telehealth: Payer: Self-pay | Admitting: Family Medicine

## 2018-10-20 DIAGNOSIS — M25561 Pain in right knee: Secondary | ICD-10-CM
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
## 2018-10-20 NOTE — Telephone Encounter (Signed)
Referral ordered in Epic. 

## 2018-10-20 NOTE — Telephone Encounter (Signed)
Please advise 

## 2018-10-20 NOTE — Telephone Encounter (Signed)
Please give referral for knee pain as requested

## 2018-10-21 DIAGNOSIS — M25561 Pain in right knee: Secondary | ICD-10-CM | POA: Diagnosis not present

## 2018-10-30 DIAGNOSIS — M25561 Pain in right knee: Secondary | ICD-10-CM | POA: Diagnosis not present

## 2018-11-23 DIAGNOSIS — M25561 Pain in right knee: Secondary | ICD-10-CM | POA: Diagnosis not present

## 2018-12-13 ENCOUNTER — Other Ambulatory Visit: Payer: Self-pay | Admitting: Family Medicine

## 2018-12-14 ENCOUNTER — Other Ambulatory Visit: Payer: Self-pay | Admitting: *Deleted

## 2018-12-14 MED ORDER — ACYCLOVIR 400 MG PO TABS: ORAL_TABLET | ORAL | 2 refills | Status: DC

## 2018-12-14 NOTE — Telephone Encounter (Signed)
May have this +2 refills 

## 2018-12-17 ENCOUNTER — Encounter (HOSPITAL_COMMUNITY): Payer: Self-pay | Admitting: Emergency Medicine

## 2018-12-17 ENCOUNTER — Other Ambulatory Visit: Payer: Self-pay

## 2018-12-17 ENCOUNTER — Ambulatory Visit (HOSPITAL_COMMUNITY)
Admission: EM | Admit: 2018-12-17 | Discharge: 2018-12-17 | Disposition: A | Discharge status: Home / Self Care | Payer: Medicaid Other | Attending: Family Medicine | Admitting: Family Medicine

## 2018-12-17 DIAGNOSIS — J029 Acute pharyngitis, unspecified: Secondary | ICD-10-CM

## 2018-12-17 DIAGNOSIS — J069 Acute upper respiratory infection, unspecified: Secondary | ICD-10-CM | POA: Diagnosis not present

## 2018-12-17 DIAGNOSIS — Z791 Long term (current) use of non-steroidal anti-inflammatories (NSAID): Secondary | ICD-10-CM | POA: Insufficient documentation

## 2018-12-17 DIAGNOSIS — R300 Dysuria: Secondary | ICD-10-CM | POA: Diagnosis not present

## 2018-12-17 DIAGNOSIS — K219 Gastro-esophageal reflux disease without esophagitis: Secondary | ICD-10-CM | POA: Diagnosis not present

## 2018-12-17 DIAGNOSIS — R05 Cough: Secondary | ICD-10-CM

## 2018-12-17 DIAGNOSIS — Z90710 Acquired absence of both cervix and uterus: Secondary | ICD-10-CM | POA: Diagnosis not present

## 2018-12-17 DIAGNOSIS — R0981 Nasal congestion: Secondary | ICD-10-CM | POA: Diagnosis not present

## 2018-12-17 DIAGNOSIS — M545 Low back pain: Secondary | ICD-10-CM | POA: Insufficient documentation

## 2018-12-17 DIAGNOSIS — Z881 Allergy status to other antibiotic agents status: Secondary | ICD-10-CM | POA: Insufficient documentation

## 2018-12-17 DIAGNOSIS — R350 Frequency of micturition: Secondary | ICD-10-CM | POA: Diagnosis not present

## 2018-12-17 DIAGNOSIS — Z885 Allergy status to narcotic agent status: Secondary | ICD-10-CM | POA: Diagnosis not present

## 2018-12-17 DIAGNOSIS — R6889 Other general symptoms and signs: Secondary | ICD-10-CM

## 2018-12-17 LAB — POCT URINALYSIS DIP (DEVICE)
Bilirubin Urine: NEGATIVE
GLUCOSE, UA: NEGATIVE mg/dL
KETONES UR: NEGATIVE mg/dL
LEUKOCYTES UA: NEGATIVE
NITRITE: NEGATIVE
PH: 6.5 (ref 5.0–8.0)
Protein, ur: NEGATIVE mg/dL
Specific Gravity, Urine: 1.015 (ref 1.005–1.030)
Urobilinogen, UA: 0.2 mg/dL (ref 0.0–1.0)

## 2018-12-17 LAB — POCT RAPID STREP A: STREPTOCOCCUS, GROUP A SCREEN (DIRECT): NEGATIVE

## 2018-12-17 MED ORDER — OSELTAMIVIR PHOSPHATE 75 MG PO CAPS: 75.0000 mg | ORAL_CAPSULE | Freq: Two times a day (BID) | ORAL | 0 refills | Status: DC

## 2018-12-17 NOTE — ED Triage Notes (Signed)
PT reports cough, sore throat, congestion for 4 days.   PT reports dysuria for 5-6 days. Frequency, low back pain, and odor as well.

## 2018-12-17 NOTE — Discharge Instructions (Addendum)
Your urine was negative for pregnancy and infection We will send for cytology.  Strep was negative for infection We will send for culture.  Your symptoms are most likely flu related.  You can do OTC cold and flu medication along with tylenol and ibuprofen alternating.  Your lab results are pending and we will call with any positive results.  Follow up as needed for continued or worsening symptoms

## 2018-12-18 LAB — URINE CYTOLOGY ANCILLARY ONLY
CHLAMYDIA, DNA PROBE: NEGATIVE
NEISSERIA GONORRHEA: NEGATIVE
TRICH (WINDOWPATH): NEGATIVE

## 2018-12-20 LAB — CULTURE, GROUP A STREP (THRC)

## 2018-12-21 LAB — URINE CYTOLOGY ANCILLARY ONLY
Bacterial vaginitis: POSITIVE — AB
Candida vaginitis: NEGATIVE

## 2018-12-21 NOTE — ED Provider Notes (Signed)
[facility]    CSN: [id number] Arrival date & time: 12/17/18  1237     History   Chief Complaint Chief Complaint  Patient presents with  . URI    HPI Heather Moon is a 38 y.o. female.   Pt is a 38 year old female that presents with cough, congestion, sore throat x 4 days. Symptoms have been constant and remain the same. She has been taking OTC meds without relief. No fever, chills, body aches.   She is also complaining of dysuria, frequency, low back pain and odor x 5 or 6 days. She denies any vaginal discharge, bleeding, itching or irritation. Denies any abdominal pain, nausea, vomiting or diarrhea. She is currently sexually active with one partner. No LMP recorded. Patient has had a hysterectomy.   ROS per HPI      Past Medical History:  Diagnosis Date  . Blood transfusion without reported diagnosis   . Cholelithiasis   . Chronic insomnia   . Constipation, chronic   . GERD (gastroesophageal reflux disease)    takes Prilosec as needed  . Kidney infection   . Migraine   . Migraine headache   . 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 UTI (lower urinary tract infection)     Patient Active Problem List   Diagnosis Date Noted  . 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 Chronic pain of right knee 12/13/2016  . Traumatic hematoma of right knee 12/13/2016  . Chronic constipation 11/18/2016  . Generalized abdominal pain 11/18/2016  . Rectal bleeding 11/18/2016  . Anxiety 09/20/2016  . History of hysterectomy, supracervical 09/19/2016  . Insomnia 10/20/2015  . Herniated cervical disc 03/24/2013  . Muscle spasms of head and/or neck 03/05/2013  . Left shoulder pain 03/05/2013  . Migraine headache without aura 03/05/2013    Past Surgical History:  Procedure Laterality Date  . ABDOMINAL HYSTERECTOMY  08/27/2010   supracervical, blood transfusion  . CHOLECYSTECTOMY  01/11/2013   Procedure: LAPAROSCOPIC CHOLECYSTECTOMY;   Surgeon: Shelly Rubenstein, MD;  Location: [facility];  Service: General;  Laterality: N/A;  Laparoscopic cholecystectomy  . EYE SURGERY Left    at 49mos old  . RENAL ARTERY STENT      OB History   No obstetric history on file.      Home Medications    Prior to Admission medications   Medication Sig Start Date End Date Taking? Authorizing Provider  acyclovir (ZOVIRAX) 400 MG tablet TAKE 1 TABLET BY MOUTH 4 TIMES A DAY FOR 5 DAYS 12/14/18   [name], MD  amoxicillin (AMOXIL) 500 MG capsule Take 1 capsule (500 mg total) by mouth 3 (three) times daily. 09/06/18   Janne Napoleon, NP  naproxen (NAPROSYN) 375 MG tablet Take 1 tablet (375 mg total) by mouth 2 (two) times daily. 09/06/18   Janne Napoleon, NP  oseltamivir (TAMIFLU) 75 MG capsule Take 1 capsule (75 mg total) by mouth every 12 (twelve) hours. 12/17/18   Dahlia Byes A, NP  zolpidem (AMBIEN) 10 MG tablet TAKE 1 TABLET BY MOUTH AT BEDTIME AS NEEDED FOR SLEEP 12/14/18   [name], MD    Family History Family History  Problem Relation Age of Onset  . Cancer Maternal Grandmother   . Cirrhosis Maternal Grandfather     Social History Social History   Tobacco Use  . Smoking status: Never Smoker  . Smokeless tobacco: Never Used  Substance Use Topics  . Alcohol use: No  . Drug use: No     Allergies   Doxycycline; Hydrocodone; Macrolides and ketolides; and Trazodone and nefazodone   Review of Systems Review of Systems   Physical Exam Triage Vital Signs ED Triage Vitals [12/17/18 1408]  Enc Vitals Group     BP 114/77     Pulse Rate 71     Resp 16     Temp 98.9 F (37.2 C)     Temp Source Oral     SpO2 100 %     Weight      Height      Head Circumference      Peak Flow      Pain Score 4     Pain Loc      Pain Edu?      Excl. in GC?    No data found.  Updated Vital Signs BP 114/77   Pulse 71   Temp 98.9 F (37.2 C) (Oral)   Resp 16   SpO2 100%   Visual Acuity Right Eye  Distance:   Left Eye Distance:   Bilateral Distance:    Right Eye Near:   Left Eye Near:    Bilateral Near:     Physical Exam Vitals signs and nursing note reviewed.  Constitutional:      Appearance: She is not ill-appearing or toxic-appearing.  HENT:     Head: Normocephalic and atraumatic.     Right Ear: Tympanic membrane, ear canal and external ear normal.     Left Ear: Tympanic membrane, ear canal and external ear normal.     Nose: Congestion present.     Mouth/Throat:     Pharynx: Oropharynx is clear. Posterior oropharyngeal erythema present.  Eyes:     Conjunctiva/sclera: Conjunctivae normal.  Cardiovascular:     Rate and Rhythm: Normal rate and regular rhythm.     Heart sounds: Normal heart sounds.  Pulmonary:     Effort: Pulmonary effort is normal.     Breath sounds: Normal breath sounds.  Abdominal:     Tenderness: There is no right CVA tenderness or left CVA tenderness.  Musculoskeletal: Normal range of motion.  Skin:    General: Skin is warm and dry.  Neurological:     Mental Status: She is alert.  Psychiatric:        Mood and Affect: Mood normal.      UC Treatments / Results  Labs (all labs ordered are listed, but only abnormal results are displayed) Labs Reviewed  POCT URINALYSIS DIP (DEVICE) - Abnormal; Notable for the following components:      Result Value   Hgb urine dipstick TRACE (*)    All other components within normal limits  CULTURE, GROUP A STREP [facility]  POCT RAPID STREP A  URINE CYTOLOGY ANCILLARY ONLY    EKG None  Radiology No results found.  Procedures Procedures (including critical care time)  Medications Ordered in UC Medications - No data to display  Initial Impression / Assessment and Plan / UC Course  I have reviewed the triage vital signs and the nursing notes.  Pertinent labs & imaging results that were available during my care of the patient were reviewed by me and considered in my medical decision making (see chart  for details).    Dysuria-  Urine negative for infection.  This could be related to a vaginal infection We will send for cytology  URI Strep negative for infection Will send  for culture Symptomatic treatment Pt requesting tamiflu even though not recommended.  Medicine sent to the pharmacy. Follow up as needed for continued or worsening symptoms  Final Clinical Impressions(s) / UC Diagnoses   Final diagnoses:  Flu-like symptoms  Urinary frequency     Discharge Instructions     Your urine was negative for pregnancy and infection We will send for cytology.  Strep was negative for infection We will send for culture.  Your symptoms are most likely flu related.  You can do OTC cold and flu medication along with tylenol and ibuprofen alternating.  Your lab results are pending and we will call with any positive results.  Follow up as needed for continued or worsening symptoms     ED Prescriptions    Medication Sig Dispense Auth. Provider   oseltamivir (TAMIFLU) 75 MG capsule Take 1 capsule (75 mg total) by mouth every 12 (twelve) hours. 10 capsule Janace Aris, NP     Controlled Substance Prescriptions [place] Controlled Substance Registry consulted? no   Janace Aris, NP 12/21/18 [phone]

## 2018-12-25 DIAGNOSIS — M25561 Pain in right knee: Secondary | ICD-10-CM | POA: Diagnosis not present

## 2018-12-31 DIAGNOSIS — M25562 Pain in left knee: Secondary | ICD-10-CM | POA: Diagnosis not present

## 2019-01-06 DIAGNOSIS — M25562 Pain in left knee: Secondary | ICD-10-CM | POA: Diagnosis not present

## 2019-01-27 DIAGNOSIS — M25562 Pain in left knee: Secondary | ICD-10-CM | POA: Diagnosis not present

## 2019-01-30 ENCOUNTER — Emergency Department (HOSPITAL_COMMUNITY)
Admission: EM | Admit: 2019-01-30 | Discharge: 2019-01-30 | Disposition: A | Discharge status: Home / Self Care | Payer: Medicaid Other | Attending: Emergency Medicine | Admitting: Emergency Medicine

## 2019-01-30 ENCOUNTER — Encounter (HOSPITAL_COMMUNITY): Payer: Self-pay

## 2019-01-30 DIAGNOSIS — N390 Urinary tract infection, site not specified: Secondary | ICD-10-CM | POA: Insufficient documentation

## 2019-01-30 DIAGNOSIS — K0889 Other specified disorders of teeth and supporting structures: Secondary | ICD-10-CM | POA: Insufficient documentation

## 2019-01-30 DIAGNOSIS — Z79899 Other long term (current) drug therapy: Secondary | ICD-10-CM | POA: Insufficient documentation

## 2019-01-30 LAB — URINALYSIS, ROUTINE W REFLEX MICROSCOPIC
Bilirubin Urine: NEGATIVE
GLUCOSE, UA: NEGATIVE mg/dL
HGB URINE DIPSTICK: NEGATIVE
Ketones, ur: NEGATIVE mg/dL
Nitrite: NEGATIVE
Protein, ur: NEGATIVE mg/dL
SPECIFIC GRAVITY, URINE: 1.006 (ref 1.005–1.030)
pH: 5 (ref 5.0–8.0)

## 2019-01-30 MED ORDER — OXYCODONE-ACETAMINOPHEN 5-325 MG PO TABS
1.0000 | ORAL_TABLET | Freq: Once | ORAL | Status: AC
  Administered 2019-01-30: 1 via ORAL
  Filled 2019-01-30: qty 1

## 2019-01-30 MED ORDER — CEPHALEXIN 500 MG PO CAPS: 500.0000 mg | ORAL_CAPSULE | Freq: Two times a day (BID) | ORAL | 0 refills | Status: DC

## 2019-01-30 MED ORDER — AMOXICILLIN-POT CLAVULANATE 875-125 MG PO TABS: 1.0000 | ORAL_TABLET | Freq: Two times a day (BID) | ORAL | 0 refills | Status: AC

## 2019-01-30 MED ORDER — AMOXICILLIN-POT CLAVULANATE 875-125 MG PO TABS: 1.0000 | ORAL_TABLET | Freq: Two times a day (BID) | ORAL | 0 refills | Status: DC

## 2019-01-30 MED ORDER — CEPHALEXIN 500 MG PO CAPS: 500.0000 mg | ORAL_CAPSULE | Freq: Two times a day (BID) | ORAL | 0 refills | Status: AC

## 2019-01-30 MED ORDER — KETOROLAC TROMETHAMINE 15 MG/ML IJ SOLN
15.0000 mg | Freq: Once | INTRAMUSCULAR | Status: AC
  Administered 2019-01-30: 15 mg via INTRAMUSCULAR
  Filled 2019-01-30: qty 1

## 2019-01-30 MED ORDER — ONDANSETRON 4 MG PO TBDP
4.0000 mg | ORAL_TABLET | Freq: Once | ORAL | Status: AC
  Administered 2019-01-30: 4 mg via ORAL
  Filled 2019-01-30: qty 1

## 2019-01-30 NOTE — ED Notes (Signed)
ED Provider at bedside. 

## 2019-01-30 NOTE — ED Provider Notes (Signed)
[facility] EMERGENCY DEPARTMENT Provider Note   CSN: [id number] Arrival date & time: 01/30/19  1851     History   Chief Complaint Chief Complaint  Patient presents with  . Dental Pain  . Urinary Tract Infection    HPI Heather Moon is a 38 y.o. female.  HPI   Pt is a 38 y/o female with a h/o recurrent UTI, constipation, gerd, cholelithiasis, migraines, who presents to the ED today for evaluation of dental pain and possible UTI.   Pt states she has had left sided dental pain that began about 1 week ago. Has had intermittent pain in this tooth in the past after fracturing it. Pain rated at 8-9/10. Pain is constant. She does not currently have a dentist.   She states she has a h/o recurrent UTI and she feels like she has one again. Reports frequency, urgency, dysuria that began 7 days ago. Is c/o left flank pain as well. No abd pain, NVD, or constipation. Has had sweats, chills, and low grade tamp. She has tried azo without relief.   Past Medical History:  Diagnosis Date  . Blood transfusion without reported diagnosis   . Cholelithiasis   . Chronic insomnia   . Constipation, chronic   . GERD (gastroesophageal reflux disease)    takes Prilosec as needed  . Kidney infection   . Migraine   . Migraine headache   . 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 UTI (lower urinary tract infection)     Patient Active Problem List   Diagnosis Date Noted  . 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 Chronic pain of right knee 12/13/2016  . Traumatic hematoma of right knee 12/13/2016  . Chronic constipation 11/18/2016  . Generalized abdominal pain 11/18/2016  . Rectal bleeding 11/18/2016  . Anxiety 09/20/2016  . History of hysterectomy, supracervical 09/19/2016  . Insomnia 10/20/2015  . Herniated cervical disc 03/24/2013  . Muscle spasms of head and/or neck 03/05/2013  . Left shoulder pain 03/05/2013  . Migraine headache without aura  03/05/2013    Past Surgical History:  Procedure Laterality Date  . ABDOMINAL HYSTERECTOMY  08/27/2010   supracervical, blood transfusion  . CHOLECYSTECTOMY  01/11/2013   Procedure: LAPAROSCOPIC CHOLECYSTECTOMY;  Surgeon: Shelly Rubenstein, MD;  Location: [facility];  Service: General;  Laterality: N/A;  Laparoscopic cholecystectomy  . EYE SURGERY Left    at 63mos old  . RENAL ARTERY STENT       OB History   No obstetric history on file.      Home Medications    Prior to Admission medications   Medication Sig Start Date End Date Taking? Authorizing Provider  acyclovir (ZOVIRAX) 400 MG tablet TAKE 1 TABLET BY MOUTH 4 TIMES A DAY FOR 5 DAYS 12/14/18   [name], MD  amoxicillin (AMOXIL) 500 MG capsule Take 1 capsule (500 mg total) by mouth 3 (three) times daily. 09/06/18   Janne Napoleon, NP  amoxicillin-clavulanate (AUGMENTIN) 875-125 MG tablet Take 1 tablet by mouth every 12 (twelve) hours for 7 days. 01/30/19 02/06/19  Cayli Escajeda S, PA-C  cephALEXin (KEFLEX) 500 MG capsule Take 1 capsule (500 mg total) by mouth 2 (two) times daily for 7 days. 01/30/19 02/06/19  Amaria Mundorf S, PA-C  naproxen (NAPROSYN) 375 MG tablet Take 1 tablet (375 mg total) by mouth 2 (two) times daily. 09/06/18   Janne Napoleon, NP  oseltamivir (TAMIFLU)  75 MG capsule Take 1 capsule (75 mg total) by mouth every 12 (twelve) hours. 12/17/18   Dahlia Byes A, NP  zolpidem (AMBIEN) 10 MG tablet TAKE 1 TABLET BY MOUTH AT BEDTIME AS NEEDED FOR SLEEP 12/14/18   [name], MD    Family History Family History  Problem Relation Age of Onset  . Cancer Maternal Grandmother   . Cirrhosis Maternal Grandfather     Social History Social History   Tobacco Use  . Smoking status: Never Smoker  . Smokeless tobacco: Never Used  Substance Use Topics  . Alcohol use: No  . Drug use: No     Allergies   Doxycycline; Hydrocodone; Macrolides and ketolides; and Trazodone and nefazodone   Review  of Systems Review of Systems  Constitutional: Negative for fever.  HENT: Positive for dental problem.   Eyes: Negative for visual disturbance.  Respiratory: Negative for cough and shortness of breath.   Cardiovascular: Negative for chest pain.  Gastrointestinal: Negative for abdominal pain, constipation, diarrhea, nausea and vomiting.  Genitourinary: Positive for dysuria, flank pain, frequency and urgency.  Musculoskeletal: Negative for myalgias.  Skin: Negative for rash.  Neurological: Negative for headaches.     Physical Exam Updated Vital Signs BP 118/84 (BP Location: Right Arm)   Pulse (!) 55   Temp 99.2 F (37.3 C) (Oral)   Resp 18   Ht 5\' 4"  (1.626 m)   Wt 70.3 kg   SpO2 99%   BMI 26.61 kg/m   Physical Exam Vitals signs and nursing note reviewed.  Constitutional:      General: She is not in acute distress.    Appearance: She is well-developed.  HENT:     Head: Normocephalic and atraumatic.     Mouth/Throat:     Mouth: Mucous membranes are moist.     Comments: Left lower molar is fractured and there is overlying filling noted.  This tooth is tender to percussion.  There is no adjacent abscess noted.  No trismus. Eyes:     Conjunctiva/sclera: Conjunctivae normal.  Neck:     Musculoskeletal: Neck supple.  Cardiovascular:     Rate and Rhythm: Normal rate and regular rhythm.     Heart sounds: No murmur.  Pulmonary:     Effort: Pulmonary effort is normal. No respiratory distress.     Breath sounds: Normal breath sounds.  Abdominal:     General: There is no distension.     Palpations: Abdomen is soft.     Tenderness: There is no abdominal tenderness. There is no right CVA tenderness or left CVA tenderness.  Musculoskeletal:     Comments: TTP to the left thoracic Paraspinous muscles  Skin:    General: Skin is warm and dry.  Neurological:     Mental Status: She is alert.    ED Treatments / Results  Labs (all labs ordered are listed, but only abnormal results  are displayed) Labs Reviewed  URINALYSIS, ROUTINE W REFLEX MICROSCOPIC - Abnormal; Notable for the following components:      Result Value   APPearance HAZY (*)    Leukocytes,Ua MODERATE (*)    Bacteria, UA RARE (*)    All other components within normal limits  URINE CULTURE    EKG None  Radiology No results found.  Procedures Procedures (including critical care time)  Medications Ordered in ED Medications  ketorolac (TORADOL) 15 MG/ML injection 15 mg (15 mg Intramuscular Given 01/30/19 1934)  oxyCODONE-acetaminophen (PERCOCET/ROXICET) 5-325 MG per tablet 1 tablet (  1 tablet Oral Given 01/30/19 2052)  ondansetron (ZOFRAN-ODT) disintegrating tablet 4 mg (4 mg Oral Given 01/30/19 2052)     Initial Impression / Assessment and Plan / ED Course  I have reviewed the triage vital signs and the nursing notes.  Pertinent labs & imaging results that were available during my care of the patient were reviewed by me and considered in my medical decision making (see chart for details).     Final Clinical Impressions(s) / ED Diagnoses   Final diagnoses:  Pain, dental  Lower urinary tract infectious disease   Patient here complaining of dental pain and UTI symptoms.  Has history of frequent UTIs.  On exam she does have a fractured tooth and tenderness to percussion to the left lower molar.  No evidence of deep space infection.  Will treat with antibiotics.  Also complaining of UTI symptoms.  UA shows leukocytes, 21-50 white blood cells, rare bacteria and 6-10 squamous epithelial cells.  Will send for culture and treat for suspected urinary tract infection.  She does not have evidence of pyelonephritis today.  Have given her dental resources.  We will also have her follow-up with her PCP in regards to her symptoms today.  Advised return to the ER for new or worsening symptoms.  She voiced understanding the plan and reasons return.  All questions answered.  Patient silver discharge.  ED  Discharge Orders         Ordered    amoxicillin-clavulanate (AUGMENTIN) 875-125 MG tablet  Every 12 hours,   Status:  Discontinued     01/30/19 2038    cephALEXin (KEFLEX) 500 MG capsule  2 times daily,   Status:  Discontinued     01/30/19 2038    amoxicillin-clavulanate (AUGMENTIN) 875-125 MG tablet  Every 12 hours     01/30/19 2101    cephALEXin (KEFLEX) 500 MG capsule  2 times daily     01/30/19 2101           Karrie MeresCouture, Karima Carrell S, PA-C 01/30/19 2126    Rolan BuccoBelfi, Melanie, MD 01/31/19 1117

## 2019-01-30 NOTE — Discharge Instructions (Signed)
You were given a prescription for antibiotics. Please take the antibiotic prescription fully.   Please follow up with your primary care provider within 5-7 days for re-evaluation of your symptoms. If you do not have a primary care provider, information for a healthcare clinic has been provided for you to make arrangements for follow up care. Please return to the emergency department for any new or worsening symptoms.  

## 2019-01-30 NOTE — ED Triage Notes (Signed)
Pt c/o L sided dental abscess on lower back molar x 1 week; also c/o dysuria, cold chills, and L sided lower back pain x 1 week; pt states it feels like the UTIs she has had in the past

## 2019-02-02 LAB — URINE CULTURE: Culture: >=100000 — AB

## 2019-02-03 ENCOUNTER — Telehealth (HOSPITAL_COMMUNITY): Payer: Self-pay | Admitting: Pharmacist

## 2019-02-03 ENCOUNTER — Telehealth: Payer: Self-pay | Admitting: *Deleted

## 2019-02-03 NOTE — Telephone Encounter (Signed)
Post ED Visit - Positive Culture Follow-up: Successful Patient Follow-Up  Culture assessed and recommendations reviewed by:  []  Enzo Bi, Pharm.D. []  Celedonio Miyamoto, [address].D., BCPS AQ-ID []  Garvin Fila, Pharm.D., BCPS []  Georgina Pillion, Pharm.D., BCPS []  [place], [address].D., BCPS, AAHIVP []  Estella Husk, Pharm.D., BCPS, AAHIVP []  Lysle Pearl, PharmD, BCPS []  Phillips Climes, PharmD, BCPS [x]  Agapito Games, PharmD, BCPS []  Verlan Friends, PharmD  Positive urine culture  []  Patient discharged without antimicrobial prescription and treatment is now indicated [x]  Organism is resistant to prescribed ED discharge antimicrobial []  Patient with positive blood cultures  Changes discussed with ED provider Burna Forts, PA-C New antibiotic prescription Bactrim DS 1 tab PO BID x 3 days [facility] [phone]  Contacted patient, date 02/03/2019, time 0900   Lysle Pearl 02/03/2019, 8:58 AM

## 2019-02-03 NOTE — Progress Notes (Signed)
ED Antimicrobial Stewardship Positive Culture Follow Up   Quintera Burkle is an 38 y.o. female who presented to [facility] on (Not on file) with a chief complaint of No chief complaint on file.   Recent Results (from the past 720 hour(s))  Urine culture     Status: Abnormal   Collection Time: 01/30/19  7:35 PM  Result Value Ref Range Status   Specimen Description URINE, RANDOM  Final   Special Requests   Final    NONE Performed at [facility], [address] [place], [place] [address]    Culture >=100,000 COLONIES/mL ENTEROBACTER AEROGENES (A)  Final   Report Status 02/02/2019 FINAL  Final   Organism ID, Bacteria ENTEROBACTER AEROGENES (A)  Final      Susceptibility   Enterobacter aerogenes - MIC*    CEFAZOLIN >=64 RESISTANT Resistant     CEFTRIAXONE <=1 SENSITIVE Sensitive     CIPROFLOXACIN <=0.25 SENSITIVE Sensitive     GENTAMICIN <=1 SENSITIVE Sensitive     IMIPENEM 2 SENSITIVE Sensitive     NITROFURANTOIN 64 INTERMEDIATE Intermediate     TRIMETH/SULFA <=20 SENSITIVE Sensitive     PIP/TAZO 8 SENSITIVE Sensitive     * >=100,000 COLONIES/mL ENTEROBACTER AEROGENES    [x]  Treated with Cephalexin (Keflex), organism resistant to prescribed antimicrobial []  Patient discharged originally without antimicrobial agent and treatment is now indicated  New antibiotic prescription: Bactrim DS 1 tab po BID x 3 days  ED Provider: Mamie Laurel, PA-C   MastersDarl Householder 02/03/2019, 8:07 AM Clinical Pharmacist Monday - Friday phone -  [phone] Saturday - Sunday phone - [phone]

## 2019-04-20 ENCOUNTER — Other Ambulatory Visit: Payer: Self-pay | Admitting: Family Medicine

## 2019-04-22 NOTE — Telephone Encounter (Signed)
Needs virtual visit scheduled Once the visit is scheduled may have 1 refill

## 2019-04-24 ENCOUNTER — Other Ambulatory Visit: Payer: Self-pay | Admitting: Family Medicine

## 2019-04-27 NOTE — Telephone Encounter (Signed)
May have this and 1 refill needs office visit within the next 60 days

## 2019-04-30 ENCOUNTER — Telehealth: Payer: Self-pay | Admitting: Family Medicine

## 2019-04-30 MED ORDER — NITROFURANTOIN MONOHYD MACRO 100 MG PO CAPS: 100.0000 mg | ORAL_CAPSULE | Freq: Two times a day (BID) | ORAL | 0 refills | Status: DC

## 2019-04-30 NOTE — Telephone Encounter (Signed)
Pt gave urine sample and thinks she has a UTI.   CB# [phone]  [id number] - [place], [place] - [address]

## 2019-04-30 NOTE — Telephone Encounter (Signed)
Pos uti on micro give her macrobid 100 bid five d

## 2019-04-30 NOTE — Telephone Encounter (Signed)
Prescription sent electronically to pharmacy. Patient notified. 

## 2019-05-04 ENCOUNTER — Other Ambulatory Visit: Payer: Self-pay | Admitting: *Deleted

## 2019-05-04 ENCOUNTER — Telehealth: Payer: Self-pay

## 2019-05-04 MED ORDER — CEPHALEXIN 500 MG PO CAPS: ORAL_CAPSULE | ORAL | 0 refills | Status: DC

## 2019-05-04 NOTE — Telephone Encounter (Signed)
Med sent to pharm. Pt notified.  

## 2019-05-04 NOTE — Telephone Encounter (Signed)
I would recommend Keflex 500 mg 1 tablet 4 times daily for the next 7 days

## 2019-05-04 NOTE — Telephone Encounter (Signed)
Patient called today stating she was was given Macrobid on 04/30/2019. She states the day after starting it she started having bad headaches and nausea. No trouble breathing,  No shortness of breath or chest pain. She states she stopped taking it yesterday. She is still experiencing the burning while urinating. She also states she is allergic to doxycycline. If something else is called in she will need that sent to [facility].please advise.

## 2019-05-11 ENCOUNTER — Other Ambulatory Visit: Payer: Self-pay

## 2019-05-11 ENCOUNTER — Ambulatory Visit (INDEPENDENT_AMBULATORY_CARE_PROVIDER_SITE_OTHER): Payer: Medicaid Other | Admitting: Family Medicine

## 2019-05-11 DIAGNOSIS — G4700 Insomnia, unspecified: Secondary | ICD-10-CM | POA: Diagnosis not present

## 2019-05-11 MED ORDER — ZOLPIDEM TARTRATE 5 MG PO TABS: 5.0000 mg | ORAL_TABLET | Freq: Every evening | ORAL | 5 refills | Status: DC | PRN

## 2019-05-11 MED ORDER — ACYCLOVIR 400 MG PO TABS: ORAL_TABLET | ORAL | 12 refills | Status: DC

## 2019-05-11 NOTE — Progress Notes (Signed)
   Subjective:    Patient ID: Heather Moon, female    DOB: Jan 04, 1981, 38 y.o.   MRN: [id number]  HPIinsomnia. Taking ambien. Working well. Pt states she takes one half to one whole tablet every night.  See discussion below Relates that Ambien helps her but sometimes makes her groggy in the morning Denies any unusual behaviors with the medicine Also needs refill on acyclovir. Takes one qid for 5 days prn.  Uses it rarely but needs refills Pt states no concerns today.  Patient overall states that she is trying to stay healthy and active occasionally gets stressed out but denies being depressed has longstanding history of insomnia does not sleep if she does not take the Ambien Virtual Visit via Video Note  I connected with Haze Rushing on 05/11/19 at  9:30 AM EDT by a video enabled telemedicine application and verified that I am speaking with the correct person using two identifiers.  Location: Patient: home Provider: office   I discussed the limitations of evaluation and management by telemedicine and the availability of in person appointments. The patient expressed understanding and agreed to proceed.  History of Present Illness:    Observations/Objective:   Assessment and Plan:   Follow Up Instructions:    I discussed the assessment and treatment plan with the patient. The patient was provided an opportunity to ask questions and all were answered. The patient agreed with the plan and demonstrated an understanding of the instructions.   The patient was advised to call back or seek an in-person evaluation if the symptoms worsen or if the condition fails to improve as anticipated.  I provided 15 minutes of non-face-to-face time during this encounter.       Review of Systems  Constitutional: Negative for activity change and appetite change.  HENT: Negative for congestion and rhinorrhea.   Respiratory: Negative for cough and shortness of breath.   Cardiovascular:  Negative for chest pain and leg swelling.  Gastrointestinal: Negative for abdominal pain, nausea and vomiting.  Skin: Negative for color change.  Neurological: Negative for dizziness and weakness.  Psychiatric/Behavioral: Negative for agitation and confusion.       Objective:   Physical Exam Patient had virtual visit Appears to be in no distress Atraumatic Neuro able to relate and oriented No apparent resp distress Color normal        Assessment & Plan:  Patient does have insomnia when she goes without her medicine she does not sleep well Therefore she is interested in continuing the medicine We did discuss how the 10 mg can cause excessive daytime fatigue especially in the morning hours she understands this and states it does occur occasionally therefore reduce it to 5 mg nightly  Acyclovir refill sent in.

## 2019-07-05 ENCOUNTER — Ambulatory Visit (INDEPENDENT_AMBULATORY_CARE_PROVIDER_SITE_OTHER): Payer: Medicaid Other | Admitting: Family Medicine

## 2019-07-05 ENCOUNTER — Other Ambulatory Visit: Payer: Self-pay

## 2019-07-05 DIAGNOSIS — R300 Dysuria: Secondary | ICD-10-CM

## 2019-07-05 MED ORDER — CIPROFLOXACIN HCL 250 MG PO TABS: 250.0000 mg | ORAL_TABLET | Freq: Two times a day (BID) | ORAL | 0 refills | Status: DC

## 2019-07-05 NOTE — Progress Notes (Signed)
   Subjective:  Audio plus video  Patient ID: Heather Moon, female    DOB: 10-Jul-1981, 38 y.o.   MRN: [id number]  Urinary Tract Infection  This is a new problem. Episode onset: 3 -4 days. Treatments tried: azo.   Virtual Visit via Video Note  I connected with Heather Moon on 07/05/19 at  3:50 PM EDT by a video enabled telemedicine application and verified that I am speaking with the correct person using two identifiers.  Location: Patient: home Provider: office   I discussed the limitations of evaluation and management by telemedicine and the availability of in person appointments. The patient expressed understanding and agreed to proceed.  History of Present Illness:    Observations/Objective:   Assessment and Plan:   Follow Up Instructions:    I discussed the assessment and treatment plan with the patient. The patient was provided an opportunity to ask questions and all were answered. The patient agreed with the plan and demonstrated an understanding of the instructions.   The patient was advised to call back or seek an in-person evaluation if the symptoms worsen or if the condition fails to improve as anticipated.  I provided 18 minutes of non-face-to-face time during this encounter.   Pod dysuria  Pos incr freq  Pos nocturia   Fever no chill no vom  Review of Systems No headache, no major weight loss or weight gain, no chest pain no back pain abdominal pain no change in bowel habits complete ROS otherwise negative     Objective:   Physical Exam  Virtual      Assessment & Plan:  Impression probable urinary tract infection discussed antibiotics prescribed symptom care discussed warning signs discussed

## 2019-07-07 ENCOUNTER — Encounter: Payer: Self-pay | Admitting: Family Medicine

## 2019-07-09 ENCOUNTER — Telehealth: Payer: Self-pay | Admitting: Family Medicine

## 2019-07-09 MED ORDER — CEPHALEXIN 500 MG PO CAPS: ORAL_CAPSULE | ORAL | 0 refills | Status: DC

## 2019-07-09 NOTE — Telephone Encounter (Signed)
Pt was prescribed ciprofloxacin (CIPRO) 250 MG tablet and has been taking it since 7/20 and her UTI symptoms have not improved. She would like to know if Dr. Nicki Reaper would call in what he prescribed last time for her to [id number] - [place], [place]

## 2019-07-09 NOTE — Telephone Encounter (Signed)
Pt contacted and states that she was prescribed a light green/dark green pill (Keflex) before at that worked great. Pt would like this called in again. Please advise. Thank you

## 2019-07-09 NOTE — Telephone Encounter (Signed)
It would be okay to use Keflex 500 mg, #28, 1 tablet taken 4 times daily, if ongoing troubles notify us

## 2019-07-09 NOTE — Telephone Encounter (Signed)
Medication sent in and pt is aware. Pt verbalized understanding 

## 2019-08-27 ENCOUNTER — Ambulatory Visit (INDEPENDENT_AMBULATORY_CARE_PROVIDER_SITE_OTHER): Payer: Medicaid Other | Admitting: Family Medicine

## 2019-08-27 ENCOUNTER — Other Ambulatory Visit: Payer: Self-pay

## 2019-08-27 DIAGNOSIS — N390 Urinary tract infection, site not specified: Secondary | ICD-10-CM | POA: Diagnosis not present

## 2019-08-27 MED ORDER — CEPHALEXIN 500 MG PO CAPS: 500.0000 mg | ORAL_CAPSULE | Freq: Four times a day (QID) | ORAL | 0 refills | Status: DC

## 2019-08-27 NOTE — Progress Notes (Signed)
   Subjective:  Audio plus video  Patient ID: Heather Moon, female    DOB: 1981-05-03, 38 y.o.   MRN: [id number]  Urinary Tract Infection  This is a new problem. Episode onset: 3-4 days. The quality of the pain is described as burning. Associated symptoms include frequency. Associated symptoms comments: Burning, odor, dark and cloudy, lower back pain. Treatments tried: AZO. The treatment provided mild relief.    Virtual Visit via Video Note  I connected with Heather Moon on 08/27/19 at  4:10 PM EDT by a video enabled telemedicine application and verified that I am speaking with the correct person using two identifiers.  Location: Patient: home Provider: office   I discussed the limitations of evaluation and management by telemedicine and the availability of in person appointments. The patient expressed understanding and agreed to proceed.  History of Present Illness:    Observations/Objective:   Assessment and Plan:   Follow Up Instructions:    I discussed the assessment and treatment plan with the patient. The patient was provided an opportunity to ask questions and all were answered. The patient agreed with the plan and demonstrated an understanding of the instructions.   The patient was advised to call back or seek an in-person evaluation if the symptoms worsen or if the condition fails to improve as anticipated.  I provided 18 minutes of non-face-to-face time during this encounter. Patient gets 1 or 2 urinary tract infections per year.  Sometimes 3.  Some dysuria.  Some increased frequency.  Some tenesmus.  No blood.  No fever no chills no upper back pain suggestive of kidney involvement  Vicente Males, LPN   Review of Systems  Genitourinary: Positive for frequency.       Objective:   Physical Exam  Virtual      Assessment & Plan:  Impression urinary tract infection.  Discussed.  Symptom care discussed.  Warning signs discussed.  Antibiotics prescribed

## 2019-08-29 ENCOUNTER — Encounter: Payer: Self-pay | Admitting: Family Medicine

## 2019-08-31 ENCOUNTER — Other Ambulatory Visit: Payer: Self-pay

## 2019-08-31 ENCOUNTER — Ambulatory Visit (INDEPENDENT_AMBULATORY_CARE_PROVIDER_SITE_OTHER): Payer: Medicaid Other | Admitting: Family Medicine

## 2019-08-31 DIAGNOSIS — G4700 Insomnia, unspecified: Secondary | ICD-10-CM

## 2019-08-31 MED ORDER — ZOLPIDEM TARTRATE 5 MG PO TABS: 10.0000 mg | ORAL_TABLET | Freq: Every evening | ORAL | 3 refills | Status: DC | PRN

## 2019-08-31 NOTE — Progress Notes (Signed)
   Subjective:    Patient ID: Heather Moon, female    DOB: April 07, 1981, 38 y.o.   MRN: [id number]  HPIdiscuss trouble sleeping. Takes ambien 5mg  and wakes about 3 or 4 every morning and does not go back to sleep. Use to take 10mg  and it worked better. Pt states she was cut back because she was sleeping late but states she was taking it later at night. Wants to try to take about 8pm so she can be asleep at 10pm.  Patient states she would prefer to try to 10 mg again the 5 mg is just not working well for her Virtual Visit via Telephone Note  I connected with Heather Moon on 08/31/19 at 11:00 AM EDT by telephone and verified that I am speaking with the correct person using two identifiers.  Location: Patient: home Provider: office   I discussed the limitations, risks, security and privacy concerns of performing an evaluation and management service by telephone and the availability of in person appointments. I also discussed with the patient that there may be a patient responsible charge related to this service. The patient expressed understanding and agreed to proceed.   History of Present Illness:    Observations/Objective:   Assessment and Plan:   Follow Up Instructions:    I discussed the assessment and treatment plan with the patient. The patient was provided an opportunity to ask questions and all were answered. The patient agreed with the plan and demonstrated an understanding of the instructions.   The patient was advised to call back or seek an in-person evaluation if the symptoms worsen or if the condition fails to improve as anticipated.  I provided 15 minutes of non-face-to-face time during this encounter.        Review of Systems  Constitutional: Negative for activity change and appetite change.  HENT: Negative for congestion and rhinorrhea.   Respiratory: Negative for cough and shortness of breath.   Cardiovascular: Negative for chest pain and leg swelling.   Gastrointestinal: Negative for abdominal pain, nausea and vomiting.  Skin: Negative for color change.  Neurological: Negative for dizziness and weakness.  Psychiatric/Behavioral: Negative for agitation and confusion.  Moderate insomnia     Objective:   Physical Exam   Today's visit was via telephone Physical exam was not possible for this visit     Assessment & Plan:  Insomnia Ambien new dose 10 mg Caution regarding drowsiness in the morning She will take it early enough in the day that will help her sleep at night and not cause drowsiness she will let us know if there is any problems otherwise follow-up 6 months

## 2019-09-01 ENCOUNTER — Other Ambulatory Visit: Payer: Self-pay | Admitting: Family Medicine

## 2019-09-01 ENCOUNTER — Telehealth: Payer: Self-pay | Admitting: Family Medicine

## 2019-09-01 MED ORDER — ZOLPIDEM TARTRATE 10 MG PO TABS: 10.0000 mg | ORAL_TABLET | Freq: Every evening | ORAL | 5 refills | Status: DC | PRN

## 2019-09-01 NOTE — Telephone Encounter (Signed)
Patent notified and verbalized understanding.

## 2019-09-01 NOTE — Telephone Encounter (Signed)
My fault A new prescription was sent in Hopefully it is correct now it does appear to be

## 2019-09-01 NOTE — Telephone Encounter (Signed)
patient was seen yesterday for medication refill and when she went to pick up medication the mg was wrong on her Ambien. It suppose to be 10 mg instead of 5mg . Patient states insurance will not pay for 60 pills at 5mg  its needs to be 10 mg at 30. [facility]

## 2019-09-23 ENCOUNTER — Other Ambulatory Visit: Payer: Self-pay

## 2019-09-23 ENCOUNTER — Ambulatory Visit (INDEPENDENT_AMBULATORY_CARE_PROVIDER_SITE_OTHER): Payer: Medicaid Other | Admitting: Family Medicine

## 2019-09-23 DIAGNOSIS — N3000 Acute cystitis without hematuria: Secondary | ICD-10-CM | POA: Diagnosis not present

## 2019-09-23 MED ORDER — CIPROFLOXACIN HCL 500 MG PO TABS: 500.0000 mg | ORAL_TABLET | Freq: Two times a day (BID) | ORAL | 0 refills | Status: DC

## 2019-09-23 NOTE — Progress Notes (Signed)
   Subjective:    Patient ID: Heather Moon, female    DOB: 12-Jan-1981, 38 y.o.   MRN: [id number]  Urinary Tract Infection  This is a new problem. Episode onset: 4 -5 days. Associated symptoms include frequency. Pertinent negatives include no nausea or vomiting. Associated symptoms comments: Frequent urination, cloudy urine, burning with urination.  Patient with dysuria urinary frequency denies high fever chills sweats flank pain nausea vomiting diarrhea Virtual Visit via Telephone Note  I connected with [name] on 09/23/19 at  9:30 AM EDT by telephone and verified that I am speaking with the correct person using two identifiers.  Location: Patient: home Provider: office   I discussed the limitations, risks, security and privacy concerns of performing an evaluation and management service by telephone and the availability of in person appointments. I also discussed with the patient that there may be a patient responsible charge related to this service. The patient expressed understanding and agreed to proceed.   History of Present Illness:    Observations/Objective:   Assessment and Plan:   Follow Up Instructions:    I discussed the assessment and treatment plan with the patient. The patient was provided an opportunity to ask questions and all were answered. The patient agreed with the plan and demonstrated an understanding of the instructions.   The patient was advised to call back or seek an in-person evaluation if the symptoms worsen or if the condition fails to improve as anticipated.  I provided 12 minutes of non-face-to-face time during this encounter.      Review of Systems  Constitutional: Negative for activity change and appetite change.  HENT: Negative for congestion and rhinorrhea.   Respiratory: Negative for cough and shortness of breath.   Cardiovascular: Negative for chest pain and leg swelling.  Gastrointestinal: Negative for abdominal pain, nausea  and vomiting.  Genitourinary: Positive for dysuria and frequency.  Skin: Negative for color change.  Neurological: Negative for dizziness and weakness.  Psychiatric/Behavioral: Negative for agitation and confusion.       Objective:   Physical Exam   Today's visit was via telephone Physical exam was not possible for this visit      Assessment & Plan:  UTI Antibiotics prescribed Warning signs discussed follow-up if problems

## 2019-10-28 ENCOUNTER — Telehealth: Payer: Self-pay | Admitting: Family Medicine

## 2019-10-28 ENCOUNTER — Other Ambulatory Visit: Payer: Self-pay | Admitting: Family Medicine

## 2019-10-28 MED ORDER — TRAMADOL HCL 50 MG PO TABS: 50.0000 mg | ORAL_TABLET | Freq: Three times a day (TID) | ORAL | 0 refills | Status: AC | PRN

## 2019-10-28 MED ORDER — AMOXICILLIN 500 MG PO TABS: 500.0000 mg | ORAL_TABLET | Freq: Three times a day (TID) | ORAL | 0 refills | Status: DC

## 2019-10-28 NOTE — Telephone Encounter (Signed)
Nurses Patient may try ibuprofen or Tylenol for her pain If she feels she needs something stronger we can send in some tramadol As for her dentist-I would recommend she call the health department and talk with them to see if they may have a list of dentists that accept Medicaid (I am unaware of any local dentist that accepts Medicaid)

## 2019-10-28 NOTE — Telephone Encounter (Signed)
Patient states she has tried ibuprofen and tylenol and feels like it has got  infected and that is why it broke off more yesterday(already had a small hole.

## 2019-10-28 NOTE — Telephone Encounter (Signed)
Prescription for amoxicillin was sent Prescription for tramadol was sent Most likely she will be able to take tramadol without issues but sometimes people can have nausea and vomiting with all pain medicines Caution drowsiness

## 2019-10-28 NOTE — Telephone Encounter (Signed)
Patient notified and verbalized understanding. 

## 2019-10-28 NOTE — Telephone Encounter (Signed)
Pt calling, states she has a tooth ache, has a chipped tooth & currently unable to find a dentist in [place] that accepts adult Medicaid  Please advise & call pt    [facility]

## 2019-11-25 ENCOUNTER — Other Ambulatory Visit: Payer: Self-pay | Admitting: Family Medicine

## 2019-11-25 ENCOUNTER — Ambulatory Visit (INDEPENDENT_AMBULATORY_CARE_PROVIDER_SITE_OTHER): Payer: Medicaid Other | Admitting: Family Medicine

## 2019-11-25 ENCOUNTER — Telehealth: Payer: Self-pay | Admitting: Family Medicine

## 2019-11-25 ENCOUNTER — Other Ambulatory Visit: Payer: Self-pay

## 2019-11-25 DIAGNOSIS — J019 Acute sinusitis, unspecified: Secondary | ICD-10-CM | POA: Diagnosis not present

## 2019-11-25 DIAGNOSIS — B9689 Other specified bacterial agents as the cause of diseases classified elsewhere: Secondary | ICD-10-CM | POA: Diagnosis not present

## 2019-11-25 MED ORDER — AMOXICILLIN 500 MG PO TABS: 500.0000 mg | ORAL_TABLET | Freq: Three times a day (TID) | ORAL | 0 refills | Status: DC

## 2019-11-25 NOTE — Telephone Encounter (Signed)
Please add her to my schedule for upper respiratory

## 2019-11-25 NOTE — Progress Notes (Signed)
   Subjective:    Patient ID: Heather Moon, female    DOB: 06-13-1981, 38 y.o.   MRN: [id number]  HPI Pt is having cough.  Patient with cough congestion drainage denies high fever chills denies body aches no wheezing difficulty breathing symptoms present over the past for 5 days other family members have similar symptoms. Virtual Visit via Telephone Note  I connected with Jakirah Zaun on 11/25/19 at  3:30 PM EST by telephone and verified that I am speaking with the correct person using two identifiers.  Location: Patient: home Provider: office   I discussed the limitations, risks, security and privacy concerns of performing an evaluation and management service by telephone and the availability of in person appointments. I also discussed with the patient that there may be a patient responsible charge related to this service. The patient expressed understanding and agreed to proceed.   History of Present Illness:    Observations/Objective:   Assessment and Plan:   Follow Up Instructions:    I discussed the assessment and treatment plan with the patient. The patient was provided an opportunity to ask questions and all were answered. The patient agreed with the plan and demonstrated an understanding of the instructions.   The patient was advised to call back or seek an in-person evaluation if the symptoms worsen or if the condition fails to improve as anticipated.  I provided 16 minutes of non-face-to-face time during this encounter.   Vicente Males, LPN    Review of Systems  Constitutional: Negative for activity change and fever.  HENT: Positive for congestion and rhinorrhea. Negative for ear pain.   Eyes: Negative for discharge.  Respiratory: Positive for cough. Negative for shortness of breath and wheezing.   Cardiovascular: Negative for chest pain.       Objective:   Physical Exam  Today's visit was via telephone Physical exam was not possible for this visit       Assessment & Plan:  Patient feels this is very much like all the other illnesses she gets she denies Covid symptoms I did encourage her that if her symptoms get worse to strongly consider going ahead and getting Covid testing.  Patient defers currently.  She will notify us if any problems antibiotic sent in

## 2020-01-03 ENCOUNTER — Ambulatory Visit (INDEPENDENT_AMBULATORY_CARE_PROVIDER_SITE_OTHER): Payer: Medicaid Other | Admitting: Family Medicine

## 2020-01-03 ENCOUNTER — Other Ambulatory Visit: Payer: Self-pay

## 2020-01-03 DIAGNOSIS — B9689 Other specified bacterial agents as the cause of diseases classified elsewhere: Secondary | ICD-10-CM

## 2020-01-03 DIAGNOSIS — J019 Acute sinusitis, unspecified: Secondary | ICD-10-CM

## 2020-01-03 DIAGNOSIS — Z20822 Contact with and (suspected) exposure to covid-19: Secondary | ICD-10-CM | POA: Diagnosis not present

## 2020-01-03 MED ORDER — CEFDINIR 300 MG PO CAPS: 300.0000 mg | ORAL_CAPSULE | Freq: Two times a day (BID) | ORAL | 0 refills | Status: DC

## 2020-01-03 NOTE — Progress Notes (Signed)
   Subjective:  Audiovideo  Patient ID: Heather Moon, female    DOB: 1981/04/10, 39 y.o.   MRN: [id number]  Sore Throat  This is a new problem. The current episode started yesterday. Associated symptoms include congestion, coughing and headaches. Associated symptoms comments: Sinus pressure, runny nose, tickle in back of throat . She has tried NSAIDs (cough drops) for the symptoms. The treatment provided mild relief.   Virtual Visit via Telephone Note  I connected with Heather Moon on 01/03/20 at  3:50 PM EST by telephone and verified that I am speaking with the correct person using two identifiers.  Location: Patient: home Provider: office   I discussed the limitations, risks, security and privacy concerns of performing an evaluation and management service by telephone and the availability of in person appointments. I also discussed with the patient that there may be a patient responsible charge related to this service. The patient expressed understanding and agreed to proceed.   History of Present Illness:    Observations/Objective:   Assessment and Plan:   Follow Up Instructions:    I discussed the assessment and treatment plan with the patient. The patient was provided an opportunity to ask questions and all were answered. The patient agreed with the plan and demonstrated an understanding of the instructions.   The patient was advised to call back or seek an in-person evaluation if the symptoms worsen or if the condition fails to improve as anticipated.  I provided 20 minutes of non-face-to-face time during this encounter.  Sinus headache and sore throat  Cong and stffy nose and dranage  Patient fairly adamant that this is a typical sinus infection for her. Of note though she has other relatives with similar symptomatology. They have COVID-19 testing pending  Somewhat diminished energy also. Appetite still decent. No major cough     Review of Systems  HENT:  Positive for congestion.   Respiratory: Positive for cough.   Neurological: Positive for headaches.       Objective:   Physical Exam  Virtual      Assessment & Plan:  Impression rhinosinusitis though cannot rule out COVID-19. Discussed. Antibiotics prescribed. Symptom care discussed. COVID-19 testing encouraged. Therefore warning signs and isolation pending results discussed

## 2020-01-28 ENCOUNTER — Encounter: Payer: Self-pay | Admitting: Nurse Practitioner

## 2020-02-14 ENCOUNTER — Other Ambulatory Visit: Payer: Self-pay

## 2020-02-14 ENCOUNTER — Encounter (HOSPITAL_COMMUNITY): Payer: Self-pay

## 2020-02-14 ENCOUNTER — Ambulatory Visit (HOSPITAL_COMMUNITY)
Admission: EM | Admit: 2020-02-14 | Discharge: 2020-02-14 | Disposition: A | Discharge status: Home / Self Care | Payer: Medicaid Other | Attending: Family Medicine | Admitting: Family Medicine

## 2020-02-14 DIAGNOSIS — R6883 Chills (without fever): Secondary | ICD-10-CM | POA: Diagnosis not present

## 2020-02-14 DIAGNOSIS — Z20822 Contact with and (suspected) exposure to covid-19: Secondary | ICD-10-CM | POA: Insufficient documentation

## 2020-02-14 DIAGNOSIS — Z888 Allergy status to other drugs, medicaments and biological substances status: Secondary | ICD-10-CM | POA: Insufficient documentation

## 2020-02-14 DIAGNOSIS — R0981 Nasal congestion: Secondary | ICD-10-CM | POA: Diagnosis present

## 2020-02-14 DIAGNOSIS — R52 Pain, unspecified: Secondary | ICD-10-CM | POA: Insufficient documentation

## 2020-02-14 DIAGNOSIS — Z881 Allergy status to other antibiotic agents status: Secondary | ICD-10-CM | POA: Insufficient documentation

## 2020-02-14 DIAGNOSIS — J0110 Acute frontal sinusitis, unspecified: Secondary | ICD-10-CM | POA: Diagnosis not present

## 2020-02-14 DIAGNOSIS — Z885 Allergy status to narcotic agent status: Secondary | ICD-10-CM | POA: Diagnosis not present

## 2020-02-14 DIAGNOSIS — Z833 Family history of diabetes mellitus: Secondary | ICD-10-CM | POA: Insufficient documentation

## 2020-02-14 LAB — POCT URINALYSIS DIP (DEVICE)
Bilirubin Urine: NEGATIVE
Glucose, UA: NEGATIVE mg/dL
Hgb urine dipstick: NEGATIVE
Ketones, ur: NEGATIVE mg/dL
Leukocytes,Ua: NEGATIVE
Nitrite: NEGATIVE
Protein, ur: NEGATIVE mg/dL
Specific Gravity, Urine: 1.015 (ref 1.005–1.030)
Urobilinogen, UA: 0.2 mg/dL (ref 0.0–1.0)
pH: 6 (ref 5.0–8.0)

## 2020-02-14 MED ORDER — AMOXICILLIN 875 MG PO TABS: 875.0000 mg | ORAL_TABLET | Freq: Two times a day (BID) | ORAL | 0 refills | Status: AC

## 2020-02-14 NOTE — ED Triage Notes (Signed)
Pt presents with c/o sinus HA, chills, body aches, sore throat, nasal congestion x two days.  Also c/o burning with urination and lower back pain x 3-4 days. Took AZO at home with no relief.

## 2020-02-14 NOTE — ED Provider Notes (Signed)
[place]    CSN: [id number] Arrival date & time: 02/14/20  1414      History   Chief Complaint Chief Complaint  Patient presents with  . Nasal Congestion    HPI Heather Moon is a 39 y.o. female.   Reports sinus pain and pressure for the last 6 days.  Reports postnasal drip and congestion, chills, body aches, headaches.  Has been taking ibuprofen for the symptoms with some relief.  Denies known fever, cough, shortness of breath, nausea, vomiting, diarrhea, rash, other symptoms.    ROS Per HPI    The history is provided by the patient.    Past Medical History:  Diagnosis Date  . Blood transfusion without reported diagnosis   . Cholelithiasis   . Chronic insomnia   . Constipation, chronic   . GERD (gastroesophageal reflux disease)    takes Prilosec as needed  . Kidney infection   . Migraine   . Migraine headache   . 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 UTI (lower urinary tract infection)     Patient Active Problem List   Diagnosis Date Noted  . 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 Chronic pain of right knee 12/13/2016  . Traumatic hematoma of right knee 12/13/2016  . Chronic constipation 11/18/2016  . Generalized abdominal pain 11/18/2016  . Rectal bleeding 11/18/2016  . Anxiety 09/20/2016  . History of hysterectomy, supracervical 09/19/2016  . Insomnia 10/20/2015  . Herniated cervical disc 03/24/2013  . Muscle spasms of head and/or neck 03/05/2013  . Left shoulder pain 03/05/2013  . Migraine headache without aura 03/05/2013    Past Surgical History:  Procedure Laterality Date  . ABDOMINAL HYSTERECTOMY  08/27/2010   supracervical, blood transfusion  . CHOLECYSTECTOMY  01/11/2013   Procedure: LAPAROSCOPIC CHOLECYSTECTOMY;  Surgeon: Harl Bowie, MD;  Location: [facility];  Service: General;  Laterality: N/A;  Laparoscopic cholecystectomy  . EYE SURGERY Left    at 15mos old  . RENAL  ARTERY STENT      OB History   No obstetric history on file.      Home Medications    Prior to Admission medications   Medication Sig Start Date End Date Taking? Authorizing Provider  zolpidem (AMBIEN) 10 MG tablet Take 1 tablet (10 mg total) by mouth at bedtime as needed. 09/01/19  Yes Luking, Elayne Snare, MD  acyclovir (ZOVIRAX) 400 MG tablet TAKE 1 TABLET BY MOUTH 4 TIMES A DAY FOR 5 DAYS 05/11/19   Kathyrn Drown, MD  amoxicillin (AMOXIL) 875 MG tablet Take 1 tablet (875 mg total) by mouth 2 (two) times daily for 7 days. 02/14/20 02/21/20  Faustino Congress, NP  cefdinir (OMNICEF) 300 MG capsule Take 1 capsule (300 mg total) by mouth 2 (two) times daily. 01/03/20   Mikey Kirschner, MD    Family History Family History  Problem Relation Age of Onset  . Diabetes Mother   . Cancer Father   . Cancer Maternal Grandmother   . Cirrhosis Maternal Grandfather     Social History Social History   Tobacco Use  . Smoking status: Never Smoker  . Smokeless tobacco: Never Used  Substance Use Topics  . Alcohol use: No  . Drug use: No     Allergies   Doxycycline, Hydrocodone, Macrolides and ketolides, and Trazodone and nefazodone   Review of Systems Review of Systems   Physical Exam Triage Vital Signs ED Triage  Vitals  Enc Vitals Group     BP 02/14/20 1534 115/80     Pulse Rate 02/14/20 1534 (!) 59     Resp 02/14/20 1534 20     Temp 02/14/20 1534 98.2 F (36.8 C)     Temp Source 02/14/20 1534 Oral     SpO2 02/14/20 1534 100 %     Weight --      Height --      Head Circumference --      Peak Flow --      Pain Score 02/14/20 1528 3     Pain Loc --      Pain Edu? --      Excl. in GC? --    No data found.  Updated Vital Signs BP 115/80 (BP Location: Right Arm)   Pulse (!) 59   Temp 98.2 F (36.8 C) (Oral)   Resp 20   SpO2 100%   Visual Acuity Right Eye Distance:   Left Eye Distance:   Bilateral Distance:    Right Eye Near:   Left Eye Near:    Bilateral  Near:     Physical Exam Vitals and nursing note reviewed.  Constitutional:      General: She is not in acute distress.    Appearance: She is well-developed. She is ill-appearing.  HENT:     Head: Normocephalic and atraumatic.      Comments: Areas of sinus tenderness to palpation    Right Ear: Tympanic membrane normal.     Left Ear: Tympanic membrane normal.     Nose: Nose normal.     Mouth/Throat:     Mouth: Mucous membranes are moist.  Eyes:     Conjunctiva/sclera: Conjunctivae normal.  Cardiovascular:     Rate and Rhythm: Normal rate and regular rhythm.     Heart sounds: No murmur.  Pulmonary:     Effort: Pulmonary effort is normal. No respiratory distress.     Breath sounds: Normal breath sounds.  Abdominal:     General: Bowel sounds are normal. There is no distension.     Palpations: Abdomen is soft. There is no mass.     Tenderness: There is no abdominal tenderness.     Hernia: No hernia is present.  Musculoskeletal:     Cervical back: Neck supple.  Skin:    General: Skin is warm and dry.  Neurological:     General: No focal deficit present.     Mental Status: She is alert and oriented to person, place, and time.  Psychiatric:        Mood and Affect: Mood normal.        Behavior: Behavior normal.      UC Treatments / Results  Labs (all labs ordered are listed, but only abnormal results are displayed) Labs Reviewed  NOVEL CORONAVIRUS, NAA (HOSP ORDER, SEND-OUT TO REF LAB; TAT 18-24 HRS)  POCT URINALYSIS DIP (DEVICE)    EKG   Radiology No results found.  Procedures Procedures (including critical care time)  Medications Ordered in UC Medications - No data to display  Initial Impression / Assessment and Plan / UC Course  I have reviewed the triage vital signs and the nursing notes.  Pertinent labs & imaging results that were available during my care of the patient were reviewed by me and considered in my medical decision making (see chart for  details).     Acute sinusitis most likely.  Sinus tenderness on palpation to the right side  of her face.  Amoxicillin 875 twice daily x10 days.  Covid test sent out today will inform patient of results.  Instructed to quarantine until results are back and negative.  Instructed on when to go to the ER.  UA today negative for infection. Final Clinical Impressions(s) / UC Diagnoses   Final diagnoses:  Acute frontal sinusitis, recurrence not specified  Chills  Body aches     Discharge Instructions     Your COVID test is pending.  You should self quarantine until your test result is back and is negative.    Take Tylenol as needed for fever or discomfort.  Rest and keep yourself hydrated.    Go to the emergency department if you develop high fever, shortness of breath, severe diarrhea, or other concerning symptoms.    You also have a sinus infection. I have sent in Amoxicillin for this. Twice a day for 10 days.    ED Prescriptions    Medication Sig Dispense Auth. Provider   amoxicillin (AMOXIL) 875 MG tablet Take 1 tablet (875 mg total) by mouth 2 (two) times daily for 7 days. 14 tablet Moshe Cipro, NP     I have reviewed the PDMP during this encounter.   Moshe Cipro, NP 02/14/20 1623

## 2020-02-14 NOTE — Discharge Instructions (Addendum)
Your COVID test is pending.  You should self quarantine until your test result is back and is negative.    Take Tylenol as needed for fever or discomfort.  Rest and keep yourself hydrated.    Go to the emergency department if you develop high fever, shortness of breath, severe diarrhea, or other concerning symptoms.    You also have a sinus infection. I have sent in Amoxicillin for this. Twice a day for 10 days.

## 2020-02-16 LAB — NOVEL CORONAVIRUS, NAA (HOSP ORDER, SEND-OUT TO REF LAB; TAT 18-24 HRS): SARS-CoV-2, NAA: NOT DETECTED

## 2020-03-24 ENCOUNTER — Other Ambulatory Visit: Payer: Self-pay

## 2020-03-24 ENCOUNTER — Ambulatory Visit (INDEPENDENT_AMBULATORY_CARE_PROVIDER_SITE_OTHER): Payer: Medicaid Other | Admitting: Family Medicine

## 2020-03-24 DIAGNOSIS — N3000 Acute cystitis without hematuria: Secondary | ICD-10-CM | POA: Diagnosis not present

## 2020-03-24 MED ORDER — SULFAMETHOXAZOLE-TRIMETHOPRIM 800-160 MG PO TABS: 1.0000 | ORAL_TABLET | Freq: Two times a day (BID) | ORAL | 0 refills | Status: DC

## 2020-03-24 NOTE — Progress Notes (Signed)
   Subjective:    Patient ID: Heather Moon, female    DOB: [birth year], 39 y.o.   MRN: [id number]   Virtual Visit via Video Note  I connected with Haze Rushing on 03/24/20 at  2:00 PM EDT by a video enabled telemedicine application and verified that I am speaking with the correct person using two identifiers.  Location: Patient: home Provider: office   I discussed the limitations of evaluation and management by telemedicine and the availability of in person appointments. The patient expressed understanding and agreed to proceed.   Dysuria  This is a new problem. The current episode started in the past 7 days. Associated symptoms include frequency and urgency. Pertinent negatives include no chills, flank pain, hematuria, nausea or vomiting.   No fever- using Advil, cranberry and AZO  Patient states last night she had body aches like she was coming down with flu  Pt stating she is thinking she is getting a "bad uti" for the past 7 days.  Has had them in the past.  About 1-2 per year.  Has been taking Cranberry Azo and not improving.  Still having burning with urination and cloudy urine. Denies fever.  Has been taking ibuprofen for low back pain. Has been feeling achiness.   Review of Systems  Constitutional: Negative for chills and fever.  Gastrointestinal: Negative for abdominal pain, constipation, diarrhea, nausea and vomiting.  Genitourinary: Positive for dysuria, frequency and urgency. Negative for flank pain, hematuria, vaginal bleeding, vaginal discharge and vaginal pain.  Musculoskeletal: Positive for back pain and myalgias.        Objective:   Physical Exam  Unable to do PE due to phone visit.      Assessment & Plan:   1. Acute cystitis without hematuria - sulfamethoxazole-trimethoprim (BACTRIM DS) 800-160 MG tablet; Take 1 tablet by mouth 2 (two) times daily.  Dispense: 10 tablet; Refill: 0  Symptoms suggestive for UTI.  Will treat empirically.   In  2/20reviewed last urine culture and grew out E.aerogenes Sensitive to bactrim.  Will start on bactrim DS bid for 5 days.  Cont increase fluids.  F/u if not improving or having worsening flank pain, fever, or chills in the next 4-5 days. Pt in agreement with plan     Follow Up Instructions:    I discussed the assessment and treatment plan with the patient. The patient was provided an opportunity to ask questions and all were answered. The patient agreed with the plan and demonstrated an understanding of the instructions.   The patient was advised to call back or seek an in-person evaluation if the symptoms worsen or if the condition fails to improve as anticipated.  I provided 12 minutes of non-face-to-face time during this encounter.

## 2020-03-30 ENCOUNTER — Telehealth: Payer: Self-pay | Admitting: Family Medicine

## 2020-03-30 NOTE — Telephone Encounter (Signed)
Pt contacted and verbalized understanding. Pt would like to schedule appt to come in and give urine sample at same time. Pt lives in [place].

## 2020-03-30 NOTE — Telephone Encounter (Signed)
Pt had phone visit with Dr. Ladona Ridgel on 4/9 and was prescribed a different medication for UTI. She is still having burning when urinating and it is cloudy. She would like to know if something else can be called in   [id number] - [place], [place] - [address]

## 2020-03-30 NOTE — Telephone Encounter (Signed)
Since this was a phone visit, pt needs to come in to leave urine sample.  Gave bactrim that was sensitive to her last UTI.  If still having symptoms, then concerning for something else and needing an exam.   Thx.   Dr. Ladona Ridgel

## 2020-04-03 ENCOUNTER — Ambulatory Visit: Payer: Medicaid Other | Admitting: Family Medicine

## 2020-04-10 ENCOUNTER — Encounter: Payer: Self-pay | Admitting: Family Medicine

## 2020-05-14 ENCOUNTER — Other Ambulatory Visit: Payer: Self-pay | Admitting: Family Medicine

## 2020-06-29 ENCOUNTER — Encounter (HOSPITAL_COMMUNITY): Payer: Self-pay | Admitting: Emergency Medicine

## 2020-06-29 ENCOUNTER — Emergency Department (HOSPITAL_COMMUNITY)
Admission: EM | Admit: 2020-06-29 | Discharge: 2020-06-29 | Disposition: A | Discharge status: Home / Self Care | Payer: Medicaid Other | Attending: Emergency Medicine | Admitting: Emergency Medicine

## 2020-06-29 ENCOUNTER — Other Ambulatory Visit: Payer: Self-pay

## 2020-06-29 ENCOUNTER — Ambulatory Visit (HOSPITAL_COMMUNITY)
Admission: EM | Admit: 2020-06-29 | Discharge: 2020-06-29 | Disposition: A | Discharge status: Home / Self Care | Payer: Medicaid Other | Attending: Family Medicine | Admitting: Family Medicine

## 2020-06-29 ENCOUNTER — Encounter (HOSPITAL_COMMUNITY): Payer: Self-pay

## 2020-06-29 DIAGNOSIS — R300 Dysuria: Secondary | ICD-10-CM | POA: Insufficient documentation

## 2020-06-29 DIAGNOSIS — Z1152 Encounter for screening for COVID-19: Secondary | ICD-10-CM | POA: Diagnosis not present

## 2020-06-29 DIAGNOSIS — R309 Painful micturition, unspecified: Secondary | ICD-10-CM | POA: Diagnosis not present

## 2020-06-29 DIAGNOSIS — M545 Low back pain: Secondary | ICD-10-CM | POA: Insufficient documentation

## 2020-06-29 DIAGNOSIS — N390 Urinary tract infection, site not specified: Secondary | ICD-10-CM | POA: Diagnosis not present

## 2020-06-29 DIAGNOSIS — Z5321 Procedure and treatment not carried out due to patient leaving prior to being seen by health care provider: Secondary | ICD-10-CM | POA: Insufficient documentation

## 2020-06-29 LAB — URINALYSIS, ROUTINE W REFLEX MICROSCOPIC
Bilirubin Urine: NEGATIVE
Glucose, UA: NEGATIVE mg/dL
Hgb urine dipstick: NEGATIVE
Ketones, ur: NEGATIVE mg/dL
Leukocytes,Ua: NEGATIVE
Nitrite: NEGATIVE
Protein, ur: NEGATIVE mg/dL
Specific Gravity, Urine: 1.006 (ref 1.005–1.030)
pH: 6 (ref 5.0–8.0)

## 2020-06-29 MED ORDER — CEPHALEXIN 500 MG PO CAPS
500.0000 mg | ORAL_CAPSULE | Freq: Two times a day (BID) | ORAL | 0 refills | Status: DC
Start: 2020-06-29 — End: 2020-09-26

## 2020-06-29 MED ORDER — OXYCODONE-ACETAMINOPHEN 5-325 MG PO TABS: 1.0000 | ORAL_TABLET | Freq: Four times a day (QID) | ORAL | 0 refills | Status: DC | PRN

## 2020-06-29 NOTE — Discharge Instructions (Addendum)
Drink lots of fluids Take antibiotic 2 times a day Take ibuprofen for moderate pain Take oxycodone for severe pain Do not drive on oxycodone Check for your urine culture result in a couple of days The Covid test will be available in 12 to 24 hours

## 2020-06-29 NOTE — ED Provider Notes (Signed)
[facility]    CSN: [id number] Arrival date & time: 06/29/20  1921      History   Chief Complaint Chief Complaint  Patient presents with  . Urinary Tract Infection    HPI Heather Moon is a 39 y.o. female.   HPI  Patient states that she has had kidney problems "all my life" She had vesicoureteral reflux and had have stents placed when she was only 39 years old Since then has had multiple problems with bladder infections and kidney infections Today he is here with urinary frequency, dysuria, malodorous urine, and flank pain No fever or chills No nausea or vomiting She identifies this as a kidney infection Her workplace is requiring a Covid test  Past Medical History:  Diagnosis Date  . Blood transfusion without reported diagnosis   . Cholelithiasis   . Chronic insomnia   . Constipation, chronic   . GERD (gastroesophageal reflux disease)    takes Prilosec as needed  . Kidney infection   . Migraine   . Migraine headache   . 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 UTI (lower urinary tract infection)     Patient Active Problem List   Diagnosis Date Noted  . Acute cystitis without hematuria 03/24/2020  . 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 Chronic pain of right knee 12/13/2016  . Traumatic hematoma of right knee 12/13/2016  . Chronic constipation 11/18/2016  . Generalized abdominal pain 11/18/2016  . Rectal bleeding 11/18/2016  . Anxiety 09/20/2016  . History of hysterectomy, supracervical 09/19/2016  . Insomnia 10/20/2015  . Herniated cervical disc 03/24/2013  . Muscle spasms of head and/or neck 03/05/2013  . Left shoulder pain 03/05/2013  . Migraine headache without aura 03/05/2013    Past Surgical History:  Procedure Laterality Date  . ABDOMINAL HYSTERECTOMY  08/27/2010   supracervical, blood transfusion  . CHOLECYSTECTOMY  01/11/2013   Procedure: LAPAROSCOPIC CHOLECYSTECTOMY;  Surgeon: Shelly Rubenstein, MD;   Location: [facility];  Service: General;  Laterality: N/A;  Laparoscopic cholecystectomy  . EYE SURGERY Left    at 51mos old  . RENAL ARTERY STENT      OB History   No obstetric history on file.      Home Medications    Prior to Admission medications   Medication Sig Start Date End Date Taking? Authorizing Provider  Phenazopyridine HCl (AZO DINE PO) Take by mouth.   Yes [provider]  acyclovir (ZOVIRAX) 400 MG tablet TAKE 1 TABLET BY MOUTH 4 TIMES A DAY FOR 5 DAYS 05/16/20   [name], MD  cephALEXin (KEFLEX) 500 MG capsule Take 1 capsule (500 mg total) by mouth 2 (two) times daily. 06/29/20   Eustace Moore, MD  oxyCODONE-acetaminophen (PERCOCET/ROXICET) 5-325 MG tablet Take 1-2 tablets by mouth every 6 (six) hours as needed for severe pain. 06/29/20   Eustace Moore, MD  zolpidem (AMBIEN) 10 MG tablet Take 1 tablet (10 mg total) by mouth at bedtime as needed. 09/01/19 06/29/20  [name], MD    Family History Family History  Problem Relation Age of Onset  . Diabetes Mother   . Cancer Father   . Cancer Maternal Grandmother   . Cirrhosis Maternal Grandfather     Social History Social History   Tobacco Use  . Smoking status: Never Smoker  . Smokeless tobacco: Never Used  Substance Use Topics  . Alcohol use: No  .  Drug use: No     Allergies   Doxycycline, Hydrocodone, Macrolides and ketolides, and Trazodone and nefazodone   Review of Systems Review of Systems  See HPI Physical Exam Triage Vital Signs ED Triage Vitals  Enc Vitals Group     BP 06/29/20 2105 107/70     Pulse Rate 06/29/20 2105 63     Resp 06/29/20 2105 20     Temp 06/29/20 2105 98 F (36.7 C)     Temp Source 06/29/20 2105 Oral     SpO2 06/29/20 2105 100 %     Weight --      Height --      Head Circumference --      Peak Flow --      Pain Score 06/29/20 2057 3     Pain Loc --      Pain Edu? --      Excl. in GC? --    No data found.  Updated  Vital Signs BP 107/70 (BP Location: Left Arm)   Pulse 63   Temp 98 F (36.7 C) (Oral)   Resp 20   SpO2 100%      Physical Exam Constitutional:      General: She is not in acute distress.    Appearance: She is well-developed.  HENT:     Head: Normocephalic and atraumatic.     Mouth/Throat:     Comments: Mask in place Eyes:     Conjunctiva/sclera: Conjunctivae normal.     Pupils: Pupils are equal, round, and reactive to light.  Cardiovascular:     Rate and Rhythm: Normal rate and regular rhythm.     Heart sounds: Normal heart sounds.  Pulmonary:     Effort: Pulmonary effort is normal. No respiratory distress.     Breath sounds: Normal breath sounds.  Abdominal:     Palpations: Abdomen is soft.     Tenderness: There is no right CVA tenderness or left CVA tenderness.     Comments: No abdominal pain or tenderness.  No real CVA tenderness  Musculoskeletal:        General: Normal range of motion.     Cervical back: Normal range of motion.  Skin:    General: Skin is warm and dry.  Neurological:     Mental Status: She is alert.  Psychiatric:        Mood and Affect: Mood normal.        Behavior: Behavior normal.      UC Treatments / Results  Labs (all labs ordered are listed, but only abnormal results are displayed) Labs Reviewed  URINE CULTURE  SARS CORONAVIRUS 2 (TAT 6-24 HRS)    EKG   Radiology No results found.  Procedures Procedures (including critical care time)  Medications Ordered in UC Medications - No data to display  Initial Impression / Assessment and Plan / UC Course  I have reviewed the triage vital signs and the nursing notes.  Pertinent labs & imaging results that were available during my care of the patient were reviewed by me and considered in my medical decision making (see chart for details).     I am treating her based more history than her physical examination findings.  Urinalysis was unremarkable.  Urine culture is going to be done.   Follow-up with your primary care doctor Final Clinical Impressions(s) / UC Diagnoses   Final diagnoses:  Lower urinary tract infectious disease  Dysuria  Encounter for screening for COVID-19  Discharge Instructions     Drink lots of fluids Take antibiotic 2 times a day Take ibuprofen for moderate pain Take oxycodone for severe pain Do not drive on oxycodone Check for your urine culture result in a couple of days The Covid test will be available in 12 to 24 hours   ED Prescriptions    Medication Sig Dispense Auth. Provider   cephALEXin (KEFLEX) 500 MG capsule Take 1 capsule (500 mg total) by mouth 2 (two) times daily. 10 capsule Eustace Moore, MD   oxyCODONE-acetaminophen (PERCOCET/ROXICET) 5-325 MG tablet Take 1-2 tablets by mouth every 6 (six) hours as needed for severe pain. 12 tablet Eustace Moore, MD     I have reviewed the PDMP during this encounter.   Eustace Moore, MD 06/29/20 2132

## 2020-06-29 NOTE — ED Triage Notes (Signed)
Patient has had symptoms for 5 days.  Patient has been taking azo.  Patient has pain on both sides of back ( mid to lower ).  Patient feels greater discomfort on left.  Patient has burning with urination.    Work place requested covid test.

## 2020-06-29 NOTE — ED Notes (Signed)
covid swab in lab 

## 2020-06-29 NOTE — ED Triage Notes (Signed)
Pt reports lower back pain and burning with urination, urine is darker than normal for the past week. Taking OTC meds without relief.

## 2020-06-30 LAB — URINE CULTURE: Culture: NO GROWTH

## 2020-07-01 LAB — URINE CULTURE
Culture: NO GROWTH
Special Requests: NORMAL

## 2020-07-01 LAB — SARS CORONAVIRUS 2 (TAT 6-24 HRS): SARS Coronavirus 2: NEGATIVE

## 2020-07-03 ENCOUNTER — Telehealth (HOSPITAL_COMMUNITY): Payer: Self-pay | Admitting: Emergency Medicine

## 2020-07-03 NOTE — Telephone Encounter (Signed)
Spoke with pt verbalized understanding to d/c antibiotics and results were negative for UTI

## 2020-08-10 ENCOUNTER — Telehealth: Payer: Self-pay | Admitting: Family Medicine

## 2020-08-10 NOTE — Telephone Encounter (Signed)
Pt states she has a UTI and wants to know if something can be called in. She states her lower back is hurting and urine is different color   [id number] - [place], [place] - [address]

## 2020-08-10 NOTE — Telephone Encounter (Signed)
Pt contacted and informed that she would need an office visit with urine sample. Informed pt to call back in the morning to have appt scheduled. Pt verbalized understanding.

## 2020-09-19 ENCOUNTER — Other Ambulatory Visit: Payer: Self-pay

## 2020-09-19 ENCOUNTER — Ambulatory Visit (HOSPITAL_COMMUNITY)
Admission: EM | Admit: 2020-09-19 | Discharge: 2020-09-19 | Disposition: A | Discharge status: Home / Self Care | Payer: Medicaid Other | Attending: Family Medicine | Admitting: Family Medicine

## 2020-09-19 DIAGNOSIS — N390 Urinary tract infection, site not specified: Secondary | ICD-10-CM | POA: Diagnosis not present

## 2020-09-19 DIAGNOSIS — Z20822 Contact with and (suspected) exposure to covid-19: Secondary | ICD-10-CM | POA: Diagnosis not present

## 2020-09-19 DIAGNOSIS — N3000 Acute cystitis without hematuria: Secondary | ICD-10-CM | POA: Diagnosis not present

## 2020-09-19 DIAGNOSIS — R109 Unspecified abdominal pain: Secondary | ICD-10-CM | POA: Diagnosis not present

## 2020-09-19 DIAGNOSIS — R059 Cough, unspecified: Secondary | ICD-10-CM | POA: Diagnosis not present

## 2020-09-19 LAB — POCT URINALYSIS DIPSTICK, ED / UC
Bilirubin Urine: NEGATIVE
Glucose, UA: NEGATIVE mg/dL
Hgb urine dipstick: NEGATIVE
Ketones, ur: NEGATIVE mg/dL
Nitrite: NEGATIVE
Protein, ur: NEGATIVE mg/dL
Specific Gravity, Urine: 1.015 (ref 1.005–1.030)
Urobilinogen, UA: 0.2 mg/dL (ref 0.0–1.0)
pH: 5.5 (ref 5.0–8.0)

## 2020-09-19 LAB — SARS CORONAVIRUS 2 (TAT 6-24 HRS): SARS Coronavirus 2: NEGATIVE

## 2020-09-19 MED ORDER — NITROFURANTOIN MONOHYD MACRO 100 MG PO CAPS
100.0000 mg | ORAL_CAPSULE | Freq: Two times a day (BID) | ORAL | 0 refills | Status: DC
Start: 2020-09-19 — End: 2020-09-26

## 2020-09-19 NOTE — ED Triage Notes (Signed)
PT reports having a UTI Sx's  And Bil flank pain and dysuria for one week.

## 2020-09-19 NOTE — ED Provider Notes (Signed)
[facility]    CSN: [id number] Arrival date & time: 09/19/20  1426      History   Chief Complaint Chief Complaint  Patient presents with   Urinary Tract Infection   Flank Pain    BIL   Cough    HPI Ashely Moon is a 39 y.o. female.   Here today for about a week of b/l low back aching, dysuria, urinary frequency. Denies fever, chills, hematuria, abdominal pain, N/V/D. Taking AZO with mild temporary relief. Long hx of UTIs, states this feels consistent. She would also like to be tested for COVID since both her daughters with her are sick currently.      Past Medical History:  Diagnosis Date   Blood transfusion without reported diagnosis    Cholelithiasis    Chronic insomnia    Constipation, chronic    GERD (gastroesophageal reflux disease)    takes Prilosec as needed   Kidney infection    Migraine    Migraine headache    Renal disorder    stent to L kidney since 39 yo   UTI (lower urinary tract infection)     Patient Active Problem List   Diagnosis Date Noted   Acute cystitis without hematuria 03/24/2020   Mixed stress and urge urinary incontinence 01/12/2018   Chronic pain of right knee 12/13/2016   Traumatic hematoma of right knee 12/13/2016   Chronic constipation 11/18/2016   Generalized abdominal pain 11/18/2016   Rectal bleeding 11/18/2016   Anxiety 09/20/2016   History of hysterectomy, supracervical 09/19/2016   Insomnia 10/20/2015   Herniated cervical disc 03/24/2013   Muscle spasms of head and/or neck 03/05/2013   Left shoulder pain 03/05/2013   Migraine headache without aura 03/05/2013    Past Surgical History:  Procedure Laterality Date   ABDOMINAL HYSTERECTOMY  08/27/2010   supracervical, blood transfusion   CHOLECYSTECTOMY  01/11/2013   Procedure: LAPAROSCOPIC CHOLECYSTECTOMY;  Surgeon: Shelly Rubenstein, MD;  Location: [facility];  Service: General;  Laterality: N/A;  Laparoscopic  cholecystectomy   EYE SURGERY Left    at 83mos old   RENAL ARTERY STENT      OB History   No obstetric history on file.      Home Medications    Prior to Admission medications   Medication Sig Start Date End Date Taking? Authorizing Provider  acyclovir (ZOVIRAX) 400 MG tablet TAKE 1 TABLET BY MOUTH 4 TIMES A DAY FOR 5 DAYS 05/16/20  Yes Luking, Jonna Coup, MD  Phenazopyridine HCl (AZO DINE PO) Take by mouth.   Yes [provider]  cephALEXin (KEFLEX) 500 MG capsule Take 1 capsule (500 mg total) by mouth 2 (two) times daily. 06/29/20   Eustace Moore, MD  nitrofurantoin, macrocrystal-monohydrate, (MACROBID) 100 MG capsule Take 1 capsule (100 mg total) by mouth 2 (two) times daily. 09/19/20   Particia Nearing, PA-C  oxyCODONE-acetaminophen (PERCOCET/ROXICET) 5-325 MG tablet Take 1-2 tablets by mouth every 6 (six) hours as needed for severe pain. 06/29/20   Eustace Moore, MD  zolpidem (AMBIEN) 10 MG tablet Take 1 tablet (10 mg total) by mouth at bedtime as needed. 09/01/19 06/29/20  [name], MD    Family History Family History  Problem Relation Age of Onset   Diabetes Mother    Cancer Father    Cancer Maternal Grandmother    Cirrhosis Maternal Grandfather     Social History Social History   Tobacco Use   Smoking status: Never  Smoker   Smokeless tobacco: Never Used  Substance Use Topics   Alcohol use: No   Drug use: No     Allergies   Doxycycline, Hydrocodone, Macrolides and ketolides, and Trazodone and nefazodone   Review of Systems Review of Systems PER HPI    Physical Exam Triage Vital Signs ED Triage Vitals  Enc Vitals Group     BP 09/19/20 1721 (!) 99/58     Pulse Rate 09/19/20 1721 (!) 56     Resp 09/19/20 1721 15     Temp 09/19/20 1721 98.7 F (37.1 C)     Temp Source 09/19/20 1721 Oral     SpO2 09/19/20 1721 100 %     Weight 09/19/20 1716 163 lb (73.9 kg)     Height 09/19/20 1716 5\' 4"  (1.626 m)     Head  Circumference --      Peak Flow --      Pain Score 09/19/20 1715 4     Pain Loc --      Pain Edu? --      Excl. in GC? --    No data found.  Updated Vital Signs BP (!) 99/58 (BP Location: Left Arm)    Pulse (!) 56    Temp 98.7 F (37.1 C) (Oral)    Resp 15    Ht 5\' 4"  (1.626 m)    Wt 163 lb (73.9 kg)    SpO2 100%    BMI 27.98 kg/m   Visual Acuity Right Eye Distance:   Left Eye Distance:   Bilateral Distance:    Right Eye Near:   Left Eye Near:    Bilateral Near:     Physical Exam Vitals and nursing note reviewed.  Constitutional:      Appearance: Normal appearance. She is not ill-appearing.  HENT:     Head: Atraumatic.  Eyes:     Extraocular Movements: Extraocular movements intact.     Conjunctiva/sclera: Conjunctivae normal.  Cardiovascular:     Rate and Rhythm: Normal rate and regular rhythm.     Heart sounds: Normal heart sounds.  Pulmonary:     Effort: Pulmonary effort is normal.     Breath sounds: Normal breath sounds.  Abdominal:     General: Bowel sounds are normal. There is no distension.     Palpations: Abdomen is soft.     Tenderness: There is no abdominal tenderness. There is no right CVA tenderness, left CVA tenderness or guarding.  Musculoskeletal:        General: Normal range of motion.     Cervical back: Normal range of motion and neck supple.  Skin:    General: Skin is warm and dry.  Neurological:     Mental Status: She is alert and oriented to person, place, and time.  Psychiatric:        Mood and Affect: Mood normal.        Thought Content: Thought content normal.        Judgment: Judgment normal.      UC Treatments / Results  Labs (all labs ordered are listed, but only abnormal results are displayed) Labs Reviewed  POCT URINALYSIS DIPSTICK, ED / UC - Abnormal; Notable for the following components:      Result Value   Leukocytes,Ua TRACE (*)    All other components within normal limits  SARS CORONAVIRUS 2 (TAT 6-24 HRS)     EKG   Radiology No results found.  Procedures Procedures (including critical care time)  Medications Ordered in UC Medications - No data to display  Initial Impression / Assessment and Plan / UC Course  I have reviewed the triage vital signs and the nursing notes.  Pertinent labs & imaging results that were available during my care of the patient were reviewed by me and considered in my medical decision making (see chart for details).     U/A showing leukocytes, and given her hx and sxs will start macrobid, push fluids, and monitor for improvement. F/u for recheck if not resolving.    Final Clinical Impressions(s) / UC Diagnoses   Final diagnoses:  Acute cystitis without hematuria   Discharge Instructions   None    ED Prescriptions    Medication Sig Dispense Auth. Provider   nitrofurantoin, macrocrystal-monohydrate, (MACROBID) 100 MG capsule Take 1 capsule (100 mg total) by mouth 2 (two) times daily. 14 capsule Particia Nearing, [place]     PDMP not reviewed this encounter.   Particia Nearing, [place] 09/19/20 2211

## 2020-09-25 ENCOUNTER — Other Ambulatory Visit: Payer: Self-pay | Admitting: Family Medicine

## 2020-09-26 ENCOUNTER — Ambulatory Visit (INDEPENDENT_AMBULATORY_CARE_PROVIDER_SITE_OTHER): Payer: Medicaid Other | Admitting: Family Medicine

## 2020-09-26 DIAGNOSIS — J019 Acute sinusitis, unspecified: Secondary | ICD-10-CM

## 2020-09-26 MED ORDER — AMOXICILLIN 500 MG PO TABS: 500.0000 mg | ORAL_TABLET | Freq: Three times a day (TID) | ORAL | 0 refills | Status: DC

## 2020-09-26 NOTE — Progress Notes (Signed)
   Subjective:    Patient ID: Heather Moon, female    DOB: Jul 29, 1981, 39 y.o.   MRN: [id number]  Cough This is a new problem. The current episode started in the past 7 days. Associated symptoms include nasal congestion and a sore throat.   Went to urgent care twice- told it was viral and not given antibiotics.   Review of Systems  HENT: Positive for sore throat.   Respiratory: Positive for cough.    No vomiting or diarrhea    Objective:   Physical Exam Nose normal ears normal lungs clear her mild sinus tenderness       Assessment & Plan:  Viral syndrome Acute rhinosinusitis Antibiotic prescribed warning signs discussed Follow-up if progressive troubles or worse

## 2020-11-08 ENCOUNTER — Other Ambulatory Visit: Payer: Self-pay | Admitting: Family Medicine

## 2020-11-13 NOTE — Telephone Encounter (Signed)
08/31/19 was last visit for sleep

## 2020-11-24 ENCOUNTER — Telehealth: Payer: Self-pay

## 2020-11-24 NOTE — Telephone Encounter (Signed)
Last seen for this med 08/31/19

## 2020-11-24 NOTE — Telephone Encounter (Signed)
Pt need refill on Pt takes daily and only filled for 15 pills can get 30 tablets ?  Pt call back [phone]

## 2020-11-25 NOTE — Telephone Encounter (Signed)
Heather Moon is a very nice patient  Our goal is to help her situation but at the same time not to create a long-term health issue 1. Previously I have talked with this patient about this particular type of medicine is no longer recommended as every evening medicine because it is associated with increased risk of developing dementia as a person gets older. I would recommend that she only utilize this on a occasional basis. That is why I have been prescribing 15 tablets at a time and the last time I had a good discussion with her which was many months ago I encourage her to try to stick with no more than 15 times a month utilizing this medicine and potentially be willing to try a lower dose such as 5 mg or a different medicine. Unfortunately many patients believe the answer to inability to sleep is taking a pill-the study show that this in the long run is not necessarily beneficial. So therefore at this point time I recommend refilling the prescription as 15 tablets Please pend If Tabatha would like to discuss this further please also recommend that she do a office visit either in person or virtual It is also fine after talking with her for our office to send her a copy of this via MyChart if she would like to read it word for work Thanks-Dr. Lorin Picket

## 2020-11-27 ENCOUNTER — Other Ambulatory Visit: Payer: Self-pay | Admitting: *Deleted

## 2020-11-27 MED ORDER — ZOLPIDEM TARTRATE 10 MG PO TABS: ORAL_TABLET | ORAL | 0 refills | Status: DC

## 2020-11-27 NOTE — Telephone Encounter (Signed)
Med was pended and sent to Dr. Lorin Picket. Please contact pt to schedule follow up visit either in person or virtual.

## 2020-11-27 NOTE — Progress Notes (Signed)
Prescription was signed patient will be doing a follow-up visit to discuss options

## 2020-11-27 NOTE — Telephone Encounter (Signed)
May pend the medicine May also schedule standard follow-up visit virtual is fine with me her choice

## 2020-11-27 NOTE — Telephone Encounter (Signed)
Patient notified of Dr. Roby Lofts recommendation and states she has been taking only half a tablet daily. She states she has trouble going to sleep and then has little energy the next day. Has tried numerous otc meds. Patient would like to schedule a virtual visit to discuss this and any other options with Dr. Lorin Picket.

## 2020-11-29 NOTE — Telephone Encounter (Signed)
I have scheduled appt for January 3rd for her to go over Meds

## 2020-12-16 ENCOUNTER — Emergency Department (HOSPITAL_COMMUNITY)
Admission: EM | Admit: 2020-12-16 | Discharge: 2020-12-17 | Disposition: A | Discharge status: Home / Self Care | Payer: Medicaid Other | Attending: Emergency Medicine | Admitting: Emergency Medicine

## 2020-12-16 ENCOUNTER — Other Ambulatory Visit: Payer: Self-pay

## 2020-12-16 DIAGNOSIS — U071 COVID-19: Secondary | ICD-10-CM | POA: Diagnosis not present

## 2020-12-16 DIAGNOSIS — M791 Myalgia, unspecified site: Secondary | ICD-10-CM | POA: Diagnosis present

## 2020-12-16 DIAGNOSIS — N3000 Acute cystitis without hematuria: Secondary | ICD-10-CM | POA: Diagnosis not present

## 2020-12-16 MED ORDER — ACETAMINOPHEN 325 MG PO TABS
650.0000 mg | ORAL_TABLET | Freq: Once | ORAL | Status: DC | PRN
  Filled 2020-12-16: qty 2

## 2020-12-16 NOTE — ED Triage Notes (Signed)
Pt saying she has been having pain with urination, headaches, chills, sore throat, nauseated.

## 2020-12-17 LAB — URINALYSIS, ROUTINE W REFLEX MICROSCOPIC
Bilirubin Urine: NEGATIVE
Glucose, UA: NEGATIVE mg/dL
Hgb urine dipstick: NEGATIVE
Ketones, ur: NEGATIVE mg/dL
Nitrite: NEGATIVE
Protein, ur: NEGATIVE mg/dL
Specific Gravity, Urine: 1.009 (ref 1.005–1.030)
WBC, UA: >50 WBC/hpf — ABNORMAL HIGH (ref 0–5)
pH: 7 (ref 5.0–8.0)

## 2020-12-17 LAB — RESP PANEL BY RT-PCR (FLU A&B, COVID) ARPGX2
Influenza A by PCR: NEGATIVE
Influenza B by PCR: NEGATIVE
SARS Coronavirus 2 by RT PCR: POSITIVE — AB

## 2020-12-17 MED ORDER — CEPHALEXIN 500 MG PO CAPS
500.0000 mg | ORAL_CAPSULE | Freq: Two times a day (BID) | ORAL | 0 refills | Status: DC
Start: 2020-12-17 — End: 2021-03-19

## 2020-12-17 MED ORDER — IBUPROFEN 400 MG PO TABS
400.0000 mg | ORAL_TABLET | Freq: Once | ORAL | Status: AC | PRN
  Administered 2020-12-17: 400 mg via ORAL
  Filled 2020-12-17: qty 1

## 2020-12-17 MED ORDER — IBUPROFEN 400 MG PO TABS
400.0000 mg | ORAL_TABLET | Freq: Once | ORAL | Status: AC
  Administered 2020-12-17: 400 mg via ORAL
  Filled 2020-12-17: qty 1

## 2020-12-17 MED ORDER — ONDANSETRON 4 MG PO TBDP
4.0000 mg | ORAL_TABLET | Freq: Three times a day (TID) | ORAL | 0 refills | Status: DC | PRN
Start: 2020-12-17 — End: 2020-12-18

## 2020-12-17 MED ORDER — ONDANSETRON 4 MG PO TBDP
4.0000 mg | ORAL_TABLET | Freq: Once | ORAL | Status: AC
  Administered 2020-12-17: 4 mg via ORAL
  Filled 2020-12-17: qty 1

## 2020-12-17 MED ORDER — CEPHALEXIN 250 MG PO CAPS
500.0000 mg | ORAL_CAPSULE | Freq: Once | ORAL | Status: AC
  Administered 2020-12-17: 500 mg via ORAL
  Filled 2020-12-17: qty 2

## 2020-12-17 NOTE — ED Notes (Signed)
Patient still in appropriate waiting area.

## 2020-12-17 NOTE — ED Notes (Signed)
Patient moved to appropriate waiting area 

## 2020-12-17 NOTE — ED Notes (Signed)
Triage rn notified of patient temperature

## 2020-12-17 NOTE — ED Provider Notes (Signed)
[facility] EMERGENCY DEPARTMENT Provider Note   CSN: [id number] Arrival date & time: 12/16/20  2311     History Chief Complaint  Patient presents with  . Urinary Tract Infection  . Nausea    Heather Moon is a 40 y.o. female.  The history is provided by the patient.  Urinary Tract Infection  Heather Moon is a 40 y.o. female who presents to the Emergency Department complaining of body aches. She presents the emergency department complaining of body aches that started on Thursday of this week. She has associated headache, chills, sore throat, nausea. She also reports 3 to 4 days of dysuria. She has associated low back pain. She does have a history of recurrent urinary tract infections and thought she has another urinary tract infection. She does have an exposure to COVID-19 on Wednesday at her work. She is not vaccinated for COVID-19. She is not currently on any medications. Symptoms are moderate, constant, worsening. No cough, vomiting, diarrhea.    Past Medical History:  Diagnosis Date  . Blood transfusion without reported diagnosis   . Cholelithiasis   . Chronic insomnia   . Constipation, chronic   . GERD (gastroesophageal reflux disease)    takes Prilosec as needed  . Kidney infection   . Migraine   . Migraine headache   . 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 UTI (lower urinary tract infection)     Patient Active Problem List   Diagnosis Date Noted  . Acute cystitis without hematuria 03/24/2020  . 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 Chronic pain of right knee 12/13/2016  . Traumatic hematoma of right knee 12/13/2016  . Chronic constipation 11/18/2016  . Generalized abdominal pain 11/18/2016  . Rectal bleeding 11/18/2016  . Anxiety 09/20/2016  . History of hysterectomy, supracervical 09/19/2016  . Insomnia 10/20/2015  . Herniated cervical disc 03/24/2013  . Muscle spasms of head and/or neck 03/05/2013  . Left  shoulder pain 03/05/2013  . Migraine headache without aura 03/05/2013    Past Surgical History:  Procedure Laterality Date  . ABDOMINAL HYSTERECTOMY  08/27/2010   supracervical, blood transfusion  . CHOLECYSTECTOMY  01/11/2013   Procedure: LAPAROSCOPIC CHOLECYSTECTOMY;  Surgeon: Harl Bowie, MD;  Location: [facility];  Service: General;  Laterality: N/A;  Laparoscopic cholecystectomy  . EYE SURGERY Left    at 63mos old  . RENAL ARTERY STENT       OB History   No obstetric history on file.     Family History  Problem Relation Age of Onset  . Diabetes Mother   . Cancer Father   . Cancer Maternal Grandmother   . Cirrhosis Maternal Grandfather     Social History   Tobacco Use  . Smoking status: Never Smoker  . Smokeless tobacco: Never Used  Substance Use Topics  . Alcohol use: No  . Drug use: No    Home Medications Prior to Admission medications   Medication Sig Start Date End Date Taking? Authorizing Provider  cephALEXin (KEFLEX) 500 MG capsule Take 1 capsule (500 mg total) by mouth 2 (two) times daily. 12/17/20  Yes Quintella Reichert, MD  ondansetron (ZOFRAN ODT) 4 MG disintegrating tablet Take 1 tablet (4 mg total) by mouth every 8 (eight) hours as needed for nausea or vomiting. 12/17/20  Yes Quintella Reichert, MD  acyclovir (ZOVIRAX) 400 MG tablet TAKE 1 TABLET BY MOUTH 4 TIMES A DAY FOR 5 DAYS 05/16/20  [name], MD  oxyCODONE-acetaminophen (PERCOCET/ROXICET) 5-325 MG tablet Take 1-2 tablets by mouth every 6 (six) hours as needed for severe pain. 06/29/20   Eustace Moore, MD  Phenazopyridine HCl (AZO DINE PO) Take by mouth.    [provider]  zolpidem (AMBIEN) 10 MG tablet TAKE 1/2 to 1 TABLET BY MOUTH AT BEDTIME PRN 11/27/20   [name], MD    Allergies    Doxycycline, Hydrocodone, Macrolides and ketolides, and Trazodone and nefazodone  Review of Systems   Review of Systems  All other systems reviewed and are  negative.   Physical Exam Updated Vital Signs BP (!) 126/53 (BP Location: Left Arm)   Pulse 95   Temp (!) 100.9 F (38.3 C) (Oral)   Resp 20   Ht 5\' 4"  (1.626 m)   Wt 72.6 kg   SpO2 99%   BMI 27.46 kg/m   Physical Exam Vitals and nursing note reviewed.  Constitutional:      Appearance: She is well-developed and well-nourished.  HENT:     Head: Normocephalic and atraumatic.  Cardiovascular:     Rate and Rhythm: Normal rate and regular rhythm.     Heart sounds: No murmur heard.   Pulmonary:     Effort: Pulmonary effort is normal. No respiratory distress.     Breath sounds: Normal breath sounds.  Abdominal:     Palpations: Abdomen is soft.     Tenderness: There is no abdominal tenderness. There is no guarding or rebound.  Musculoskeletal:        General: No tenderness or edema.  Skin:    General: Skin is warm and dry.  Neurological:     Mental Status: She is alert and oriented to person, place, and time.  Psychiatric:        Mood and Affect: Mood and affect normal.        Behavior: Behavior normal.     ED Results / Procedures / Treatments   Labs (all labs ordered are listed, but only abnormal results are displayed) Labs Reviewed  RESP PANEL BY RT-PCR (FLU A&B, COVID) ARPGX2 - Abnormal; Notable for the following components:      Result Value   SARS Coronavirus 2 by RT PCR POSITIVE (*)    All other components within normal limits  URINALYSIS, ROUTINE W REFLEX MICROSCOPIC - Abnormal; Notable for the following components:   APPearance HAZY (*)    Leukocytes,Ua LARGE (*)    WBC, UA >50 (*)    Bacteria, UA FEW (*)    Non Squamous Epithelial 0-5 (*)    All other components within normal limits  URINE CULTURE    EKG None  Radiology No results found.  Procedures Procedures (including critical care time)  Medications Ordered in ED Medications  cephALEXin (KEFLEX) capsule 500 mg (has no administration in time range)  ondansetron (ZOFRAN-ODT) disintegrating  tablet 4 mg (has no administration in time range)  ibuprofen (ADVIL) tablet 400 mg (400 mg Oral Given 12/17/20 02/14/21)    ED Course  I have reviewed the triage vital signs and the nursing notes.  Pertinent labs & imaging results that were available during my care of the patient were reviewed by me and considered in my medical decision making (see chart for details).    MDM Rules/Calculators/A&P                         patient here for evaluation of body aches, sore  throat, nausea, headache, low back pain. She is non-toxic appearing on evaluation. UA is concerning for UTI in setting of her symptoms with dysuria. Presentation is not consistent with pyelonephritis or upper urinary tract infection. She also is positive for COVID-19 infection. Discussed with patient home care for UTI as well as COVID-19. Discussed symptomatic treatment with outpatient follow-up and return precautions.  Heather Moon was evaluated in Emergency Department on 12/17/2020 for the symptoms described in the history of present illness. She was evaluated in the context of the global COVID-19 pandemic, which necessitated consideration that the patient might be at risk for infection with the SARS-CoV-2 virus that causes COVID-19. Institutional protocols and algorithms that pertain to the evaluation of patients at risk for COVID-19 are in a state of rapid change based on information released by regulatory bodies including the CDC and federal and state organizations. These policies and algorithms were followed during the patient's care in the ED.   Final Clinical Impression(s) / ED Diagnoses Final diagnoses:  COVID-19 virus infection  Acute cystitis without hematuria    Rx / DC Orders ED Discharge Orders         Ordered    cephALEXin (KEFLEX) 500 MG capsule  2 times daily        12/17/20 0725    ondansetron (ZOFRAN ODT) 4 MG disintegrating tablet  Every 8 hours PRN        12/17/20 0725           Tilden Fossa,  MD 12/17/20 [phone]

## 2020-12-17 NOTE — ED Notes (Signed)
No answer from the lobby at this time.

## 2020-12-18 ENCOUNTER — Telehealth (INDEPENDENT_AMBULATORY_CARE_PROVIDER_SITE_OTHER): Payer: Medicaid Other | Admitting: Family Medicine

## 2020-12-18 ENCOUNTER — Other Ambulatory Visit: Payer: Self-pay

## 2020-12-18 DIAGNOSIS — U071 COVID-19: Secondary | ICD-10-CM | POA: Diagnosis not present

## 2020-12-18 DIAGNOSIS — R110 Nausea: Secondary | ICD-10-CM

## 2020-12-18 LAB — URINE CULTURE: Culture: <10000 — AB

## 2020-12-18 MED ORDER — ACYCLOVIR 400 MG PO TABS: ORAL_TABLET | ORAL | 6 refills | Status: DC

## 2020-12-18 MED ORDER — ZOLPIDEM TARTRATE 10 MG PO TABS: ORAL_TABLET | ORAL | 5 refills | Status: DC

## 2020-12-18 MED ORDER — ONDANSETRON 8 MG PO TBDP: 8.0000 mg | ORAL_TABLET | Freq: Three times a day (TID) | ORAL | 0 refills | Status: DC | PRN

## 2020-12-18 MED ORDER — TRAMADOL HCL 50 MG PO TABS: ORAL_TABLET | ORAL | 0 refills | Status: DC

## 2020-12-18 NOTE — Progress Notes (Signed)
   Subjective:    Patient ID: Heather Moon, female    DOB: 1981/03/17, 40 y.o.   MRN: [id number]  HPImed check up.  She does use Ambien to help her sleep she denies abusing it states without it she has difficult time sleeping she tried a lower dose and it did not help  She does relate Covid symptoms kicked in a few days ago had a positive test finds himself having a bad headache fatigue tiredness denies shortness of breath. Requesting something to help with the headache. Also with some nausea. Pt was diagnosed with covid yesterday. Pt states tylenol and advil is not helping with headache and pain in low back. Wanted to know if something for pain could be prescribed.   Pt needs refills on ambien and acyclovir.   Virtual Visit via Telephone Note  I connected with Haze Rushing on 12/18/20 at  9:30 AM EST by telephone and verified that I am speaking with the correct person using two identifiers.  Location: Patient: home Provider: office   I discussed the limitations, risks, security and privacy concerns of performing an evaluation and management service by telephone and the availability of in person appointments. I also discussed with the patient that there may be a patient responsible charge related to this service. The patient expressed understanding and agreed to proceed.   History of Present Illness:    Observations/Objective:   Assessment and Plan:   Follow Up Instructions:    I discussed the assessment and treatment plan with the patient. The patient was provided an opportunity to ask questions and all were answered. The patient agreed with the plan and demonstrated an understanding of the instructions.   The patient was advised to call back or seek an in-person evaluation if the symptoms worsen or if the condition fails to improve as anticipated.  I provided 25 minutes chart review and time spent documenting and time spent with patient minutes of non-face-to-face time  during this encounter.        Review of Systems     Objective:   Physical Exam  Today's visit was via telephone Physical exam was not possible for this visit Patient without  Unable to do video      Assessment & Plan:   Covid infection This is a viral process.  Mild cases are treated with supportive measures at home such as Tylenol rest fluids.  In some situations monoclonal antibodies may be appropriate depending on the patient's risk criteria.  The patient was educated regarding progressive illness including respiratory, persistent vomiting, change in mental status.  If any of these occur ER evaluation is recommended. Patient was educated about the following as well Covid-19 respiratory warning: Covid-19 is a virus that causes hypoxia (low oxygen level in blood) in some people. If you develop any changes in your usual breathing pattern: difficulty catching your breath, more short winded with activity or with resting, or anything that concerns you about your breathing, do not hesitate to go to the emergency department immediately for evaluation. Please do not delay to get treatment.   Agrees with plan of care discussed today. Understands warning signs to seek further care: Chest pain, shortness of breath, mental confusion, profuse vomiting, any significant change in health. Understands to follow-up if symptoms do not improve, or worsen.

## 2020-12-19 ENCOUNTER — Telehealth: Payer: Self-pay | Admitting: *Deleted

## 2020-12-19 NOTE — Telephone Encounter (Signed)
Error

## 2020-12-25 ENCOUNTER — Emergency Department (HOSPITAL_COMMUNITY)
Admission: EM | Admit: 2020-12-25 | Discharge: 2020-12-26 | Disposition: A | Discharge status: Home / Self Care | Payer: Medicaid Other | Attending: Emergency Medicine | Admitting: Emergency Medicine

## 2020-12-25 ENCOUNTER — Other Ambulatory Visit: Payer: Self-pay

## 2020-12-25 ENCOUNTER — Encounter (HOSPITAL_COMMUNITY): Payer: Self-pay | Admitting: Emergency Medicine

## 2020-12-25 DIAGNOSIS — J029 Acute pharyngitis, unspecified: Secondary | ICD-10-CM | POA: Insufficient documentation

## 2020-12-25 DIAGNOSIS — R439 Unspecified disturbances of smell and taste: Secondary | ICD-10-CM | POA: Diagnosis not present

## 2020-12-25 DIAGNOSIS — Z5321 Procedure and treatment not carried out due to patient leaving prior to being seen by health care provider: Secondary | ICD-10-CM | POA: Diagnosis not present

## 2020-12-25 MED ORDER — ACETAMINOPHEN 500 MG PO TABS
1000.0000 mg | ORAL_TABLET | Freq: Once | ORAL | Status: AC
  Administered 2020-12-25: 1000 mg via ORAL
  Filled 2020-12-25: qty 2

## 2020-12-25 MED ORDER — ACETAMINOPHEN 325 MG PO TABS: 650.0000 mg | ORAL_TABLET | Freq: Once | ORAL | Status: DC

## 2020-12-25 NOTE — ED Triage Notes (Signed)
Pt d/c with Covid 1/1, pt reports continued loss of taste/smell, sore throat, lethargy. Last Tylenol 1400 today.

## 2020-12-26 ENCOUNTER — Telehealth: Payer: Self-pay

## 2020-12-26 NOTE — Telephone Encounter (Addendum)
Patient notified-verbalized understanding and will get work note thru my chart and will be seen if progressive symptoms or SOB (patient states she was seen in the ER last night)

## 2020-12-26 NOTE — Telephone Encounter (Signed)
Went back to ER last night and was told bp was running low. Feels weak and having body aches. Would like work excuse extended. Left hospital before she was released because her son was also being seen and after he was released he was told he could not stay at hospital so mom left too.

## 2020-12-26 NOTE — ED Notes (Signed)
Called Pt for room Pt not in waiting room Pt left with family after they were seen.

## 2020-12-26 NOTE — Telephone Encounter (Signed)
May give a work excuse through this weekend with planned return to work on Monday certainly if she does not feel up to returning to work on Monday we can extend it beyond that  Obviously patient starts having shortness of breath or progressive symptoms she needs to be seen

## 2020-12-26 NOTE — Telephone Encounter (Signed)
Pt needs a extended work not she is still not feeling better from Covid  Pt call back [phone]

## 2020-12-27 ENCOUNTER — Encounter: Payer: Self-pay | Admitting: Family Medicine

## 2021-03-19 ENCOUNTER — Encounter (HOSPITAL_COMMUNITY): Payer: Self-pay | Admitting: Emergency Medicine

## 2021-03-19 ENCOUNTER — Other Ambulatory Visit: Payer: Self-pay

## 2021-03-19 ENCOUNTER — Ambulatory Visit (HOSPITAL_COMMUNITY)
Admission: EM | Admit: 2021-03-19 | Discharge: 2021-03-19 | Disposition: A | Discharge status: Home / Self Care | Payer: Medicaid Other | Attending: Student | Admitting: Student

## 2021-03-19 DIAGNOSIS — N390 Urinary tract infection, site not specified: Secondary | ICD-10-CM | POA: Insufficient documentation

## 2021-03-19 LAB — POCT URINALYSIS DIPSTICK, ED / UC
Bilirubin Urine: NEGATIVE
Glucose, UA: NEGATIVE mg/dL
Ketones, ur: NEGATIVE mg/dL
Nitrite: NEGATIVE
Protein, ur: NEGATIVE mg/dL
Specific Gravity, Urine: 1.015 (ref 1.005–1.030)
Urobilinogen, UA: 0.2 mg/dL (ref 0.0–1.0)
pH: 7 (ref 5.0–8.0)

## 2021-03-19 MED ORDER — CEPHALEXIN 500 MG PO CAPS: 500.0000 mg | ORAL_CAPSULE | Freq: Two times a day (BID) | ORAL | 0 refills | Status: AC

## 2021-03-19 NOTE — ED Provider Notes (Signed)
[facility]    CSN: [id number] Arrival date & time: 03/19/21  1624      History   Chief Complaint Chief Complaint  Patient presents with  . Dysuria    HPI Heather Moon is a 40 y.o. female.   Patient presents with 1 week history of dysuria and urinary frequency.  She denies fever, chills, abdominal pain, vaginal discharge, pelvic pain, or other symptoms.  Treatment attempted at home with Azo.  Her medical history includes migraine headaches, left kidney stent, herniated cervical disc, chronic constipation, chronic right knee pain, insomnia, anxiety. The history is provided by the patient and medical records.    Past Medical History:  Diagnosis Date  . Blood transfusion without reported diagnosis   . Cholelithiasis   . Chronic insomnia   . Constipation, chronic   . GERD (gastroesophageal reflux disease)    takes Prilosec as needed  . Kidney infection   . Migraine   . Migraine headache   . 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 UTI (lower urinary tract infection)     Patient Active Problem List   Diagnosis Date Noted  . Acute cystitis without hematuria 03/24/2020  . 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 Chronic pain of right knee 12/13/2016  . Traumatic hematoma of right knee 12/13/2016  . Chronic constipation 11/18/2016  . Generalized abdominal pain 11/18/2016  . Rectal bleeding 11/18/2016  . Anxiety 09/20/2016  . History of hysterectomy, supracervical 09/19/2016  . Insomnia 10/20/2015  . Herniated cervical disc 03/24/2013  . Muscle spasms of head and/or neck 03/05/2013  . Left shoulder pain 03/05/2013  . Migraine headache without aura 03/05/2013    Past Surgical History:  Procedure Laterality Date  . ABDOMINAL HYSTERECTOMY  08/27/2010   supracervical, blood transfusion  . CHOLECYSTECTOMY  01/11/2013   Procedure: LAPAROSCOPIC CHOLECYSTECTOMY;  Surgeon: Shelly Rubenstein, MD;  Location: [facility];   Service: General;  Laterality: N/A;  Laparoscopic cholecystectomy  . EYE SURGERY Left    at 19mos old  . RENAL ARTERY STENT      OB History   No obstetric history on file.      Home Medications    Prior to Admission medications   Medication Sig Start Date End Date Taking? Authorizing Provider  cephALEXin (KEFLEX) 500 MG capsule Take 1 capsule (500 mg total) by mouth 2 (two) times daily for 5 days. 03/19/21 03/24/21 Yes Mickie Bail, NP  Phenazopyridine HCl (AZO DINE PO) Take by mouth.   Yes [provider]  zolpidem (AMBIEN) 10 MG tablet TAKE 1/2 to 1 TABLET BY MOUTH AT BEDTIME PRN 12/18/20  Yes Luking, Scott A, MD  acyclovir (ZOVIRAX) 400 MG tablet TAKE 1 TABLET BY MOUTH 4 TIMES A DAY FOR 5 DAYS 12/18/20   [name], MD  ondansetron (ZOFRAN ODT) 8 MG disintegrating tablet Take 1 tablet (8 mg total) by mouth every 8 (eight) hours as needed for nausea or vomiting. 12/18/20   [name], MD  traMADol Janean Sark) 50 MG tablet Take one tablet po q 8 hrs prn HA 12/18/20   Luking, Jonna Coup, MD    Family History Family History  Problem Relation Age of Onset  . Diabetes Mother   . Cancer Father   . Cancer Maternal Grandmother   . Cirrhosis Maternal Grandfather     Social History Social History   Tobacco Use  . Smoking status: Never Smoker  .  Smokeless tobacco: Never Used  Substance Use Topics  . Alcohol use: No  . Drug use: No     Allergies   Doxycycline, Hydrocodone, Macrolides and ketolides, and Trazodone and nefazodone   Review of Systems Review of Systems  Constitutional: Negative for chills and fever.  HENT: Negative for ear pain and sore throat.   Eyes: Negative for pain and visual disturbance.  Respiratory: Negative for cough and shortness of breath.   Cardiovascular: Negative for chest pain and palpitations.  Gastrointestinal: Negative for abdominal pain and vomiting.  Genitourinary: Positive for dysuria and frequency. Negative for hematuria, pelvic pain  and vaginal discharge.  Musculoskeletal: Negative for arthralgias and back pain.  Skin: Negative for color change and rash.  Neurological: Negative for seizures and syncope.  All other systems reviewed and are negative.    Physical Exam Triage Vital Signs ED Triage Vitals  Enc Vitals Group     BP      Pulse      Resp      Temp      Temp src      SpO2      Weight      Height      Head Circumference      Peak Flow      Pain Score      Pain Loc      Pain Edu?      Excl. in GC?    No data found.  Updated Vital Signs BP 103/62 (BP Location: Right Arm)   Pulse 62   Temp 98.1 F (36.7 C) (Oral)   Resp 20   SpO2 97%   Visual Acuity Right Eye Distance:   Left Eye Distance:   Bilateral Distance:    Right Eye Near:   Left Eye Near:    Bilateral Near:     Physical Exam Vitals and nursing note reviewed.  Constitutional:      General: She is not in acute distress.    Appearance: She is well-developed. She is not ill-appearing.  HENT:     Head: Normocephalic and atraumatic.     Mouth/Throat:     Mouth: Mucous membranes are moist.  Eyes:     Conjunctiva/sclera: Conjunctivae normal.  Cardiovascular:     Rate and Rhythm: Normal rate and regular rhythm.     Heart sounds: Normal heart sounds.  Pulmonary:     Effort: Pulmonary effort is normal. No respiratory distress.     Breath sounds: Normal breath sounds.  Abdominal:     General: Bowel sounds are normal.     Palpations: Abdomen is soft.     Tenderness: There is no abdominal tenderness. There is no right CVA tenderness, left CVA tenderness, guarding or rebound.  Musculoskeletal:     Cervical back: Neck supple.  Skin:    General: Skin is warm and dry.  Neurological:     General: No focal deficit present.     Mental Status: She is alert and oriented to person, place, and time.     Gait: Gait normal.  Psychiatric:        Mood and Affect: Mood normal.        Behavior: Behavior normal.      UC Treatments /  Results  Labs (all labs ordered are listed, but only abnormal results are displayed) Labs Reviewed  POCT URINALYSIS DIPSTICK, ED / UC - Abnormal; Notable for the following components:      Result Value   Hgb urine dipstick  TRACE (*)    Leukocytes,Ua LARGE (*)    All other components within normal limits  URINE CULTURE  POCT URINALYSIS DIPSTICK, ED / UC    EKG   Radiology No results found.  Procedures Procedures (including critical care time)  Medications Ordered in UC Medications - No data to display  Initial Impression / Assessment and Plan / UC Course  I have reviewed the triage vital signs and the nursing notes.  Pertinent labs & imaging results that were available during my care of the patient were reviewed by me and considered in my medical decision making (see chart for details).   Urinary tract infection without hematuria.  Patient has history of left kidney stent as a child.  Urine culture pending.  Treating with Keflex.  Discussed with patient that we will call her if the culture shows the need to change or discontinue her antibiotic.  Instructed her to follow-up with her PCP if her symptoms are not improving.  She agrees to plan of care.   Final Clinical Impressions(s) / UC Diagnoses   Final diagnoses:  Urinary tract infection without hematuria, site unspecified     Discharge Instructions     Take the antibiotic as directed.  The urine culture is pending.  We will call you if it shows the need to change or discontinue your antibiotic.    Follow up with your primary care provider if your symptoms are not improving.        ED Prescriptions    Medication Sig Dispense Auth. Provider   cephALEXin (KEFLEX) 500 MG capsule Take 1 capsule (500 mg total) by mouth 2 (two) times daily for 5 days. 10 capsule Mickie Bail, NP     PDMP not reviewed this encounter.   Mickie Bail, NP 03/19/21 [phone]

## 2021-03-19 NOTE — ED Triage Notes (Signed)
Pt presents today with frequent/painful urination x 1 week. She has been using AZO OTC with no relief.

## 2021-03-19 NOTE — Discharge Instructions (Signed)
Take the antibiotic as directed.  The urine culture is pending.  We will call you if it shows the need to change or discontinue your antibiotic.    Follow up with your primary care provider if your symptoms are not improving.    

## 2021-03-22 LAB — URINE CULTURE: Culture: >=100000 — AB

## 2021-03-23 ENCOUNTER — Telehealth (HOSPITAL_COMMUNITY): Payer: Self-pay | Admitting: Emergency Medicine

## 2021-03-23 MED ORDER — SULFAMETHOXAZOLE-TRIMETHOPRIM 800-160 MG PO TABS: 1.0000 | ORAL_TABLET | Freq: Two times a day (BID) | ORAL | 0 refills | Status: AC

## 2021-04-06 ENCOUNTER — Telehealth: Payer: Self-pay | Admitting: Family Medicine

## 2021-04-06 MED ORDER — ONDANSETRON 8 MG PO TBDP
ORAL_TABLET | ORAL | 2 refills | Status: DC
Start: 2021-04-06 — End: 2022-04-25

## 2021-04-06 NOTE — Telephone Encounter (Signed)
Patient has diarrhea and throwing up and wanting something called into [facility] [address] [place]

## 2021-04-06 NOTE — Telephone Encounter (Signed)
I would recommend 2 medications #1-Zofran 8 mg dissolvable tablet may utilize 3 times daily as needed for nausea, #15 with 2 refills #2-Imodium OTC 2 mg-take 2 tablets to begin with then 1 every loose stool for a maximum of 5 tablets in a day Also stick with mainly clear liquids and bland diet such as crackers Jell-O applesauce soup-such as chicken broth until feeling better then once feeling better may advance diet Obviously if severe progression over the next couple days may end up needing to go to ER if not improving but hopefully should turn the corner over the next 24 to 48 hours

## 2021-04-06 NOTE — Telephone Encounter (Signed)
Left message to return call 

## 2021-04-06 NOTE — Telephone Encounter (Signed)
Started last night. Started with belching- belching a lot, abdominal pain, really bad stomachache. Watery diarrhea. Feels bloated and "feels like I got punched in the stomach". Bad smell with belching-like a sour stomach. Vomit x1. Pt has not tried any OTC meds at this time due to not being able to get off toilet. Please advise. Thank you

## 2021-04-06 NOTE — Telephone Encounter (Signed)
Medication sent to pharmacy. Pt advised and verbalized understanding.

## 2021-06-12 ENCOUNTER — Telehealth: Payer: Self-pay | Admitting: Family Medicine

## 2021-06-12 MED ORDER — CEPHALEXIN 500 MG PO CAPS
ORAL_CAPSULE | ORAL | 0 refills | Status: DC
Start: 2021-06-12 — End: 2021-08-31

## 2021-06-12 MED ORDER — MUPIROCIN 2 % EX OINT: TOPICAL_OINTMENT | CUTANEOUS | 2 refills | Status: DC

## 2021-06-12 NOTE — Telephone Encounter (Signed)
Pt calling into office and states that she a has a scab on outside corner of mouth. Started off as blackhead and the pt popped up. Pt is wanting to know if something can be called in. Pt states that the skin dried over now and itches. Pt having to wear mask for 10 hours at work and area is now irritated. Has used multiple OTC treatments with no relief.  (Asked pt to send picture via my chart).  (Pt states if virtual visit is necessary it needs to be in the morning because pt just started a new job).   [facility]  Please advise. Thank you

## 2021-06-12 NOTE — Addendum Note (Signed)
Addended by: Marlowe Shores on: 06/12/2021 04:57 PM   Modules accepted: Orders

## 2021-06-12 NOTE — Telephone Encounter (Signed)
Medication sent to pharmacy and pt is aware. Pt verbalized understanding  

## 2021-06-12 NOTE — Telephone Encounter (Signed)
Pt did send picture via my chart. When going into my chart message, the attachment is not there but if you open her chart and go to the my chart encounter, the attachment is included.

## 2021-06-12 NOTE — Telephone Encounter (Signed)
More than likely this is a staph infection of the skin causing this Keflex 500mg  3 times daily for 7 days Also Bactroban ointment apply thin amount 2-3 times per day over the next week send in tube 22g with 2 rf If ongoing troubles follow-up

## 2021-06-21 ENCOUNTER — Other Ambulatory Visit: Payer: Self-pay | Admitting: Family Medicine

## 2021-06-22 NOTE — Telephone Encounter (Signed)
Last visit for this med was 12/18/20

## 2021-07-30 ENCOUNTER — Ambulatory Visit (HOSPITAL_COMMUNITY)
Admission: EM | Admit: 2021-07-30 | Discharge: 2021-07-30 | Disposition: A | Discharge status: Home / Self Care | Payer: Medicaid Other | Attending: Sports Medicine | Admitting: Sports Medicine

## 2021-07-30 ENCOUNTER — Encounter (HOSPITAL_COMMUNITY): Payer: Self-pay | Admitting: *Deleted

## 2021-07-30 ENCOUNTER — Other Ambulatory Visit: Payer: Self-pay

## 2021-07-30 DIAGNOSIS — Z20822 Contact with and (suspected) exposure to covid-19: Secondary | ICD-10-CM | POA: Diagnosis not present

## 2021-07-30 DIAGNOSIS — Z8616 Personal history of COVID-19: Secondary | ICD-10-CM | POA: Diagnosis not present

## 2021-07-30 DIAGNOSIS — R6883 Chills (without fever): Secondary | ICD-10-CM | POA: Diagnosis not present

## 2021-07-30 DIAGNOSIS — N3000 Acute cystitis without hematuria: Secondary | ICD-10-CM | POA: Diagnosis not present

## 2021-07-30 DIAGNOSIS — J069 Acute upper respiratory infection, unspecified: Secondary | ICD-10-CM | POA: Insufficient documentation

## 2021-07-30 LAB — POCT URINALYSIS DIPSTICK, ED / UC
Bilirubin Urine: NEGATIVE
Glucose, UA: NEGATIVE mg/dL
Hgb urine dipstick: NEGATIVE
Ketones, ur: NEGATIVE mg/dL
Nitrite: NEGATIVE
Protein, ur: NEGATIVE mg/dL
Specific Gravity, Urine: 1.02 (ref 1.005–1.030)
Urobilinogen, UA: 0.2 mg/dL (ref 0.0–1.0)
pH: 7 (ref 5.0–8.0)

## 2021-07-30 LAB — POC URINE PREG, ED: Preg Test, Ur: NEGATIVE

## 2021-07-30 MED ORDER — NITROFURANTOIN MONOHYD MACRO 100 MG PO CAPS: 100.0000 mg | ORAL_CAPSULE | Freq: Two times a day (BID) | ORAL | 0 refills | Status: AC

## 2021-07-30 NOTE — ED Provider Notes (Signed)
[facility]    CSN: [id number] Arrival date & time: 07/30/21  1749      History   Chief Complaint Chief Complaint  Patient presents with   Sore Throat   Chills   Urinary Tract Infection    HPI Heather Moon is a 40 y.o. female who presents with nasal congestion, cough and sore throat in addition to dysuria.  Patient reports that on Saturday she began experiencing nasal congestion and a sore throat.  Over the weekend she began developing a headache and a cough, mildly productive at times.  She has also reported subjective fever and chills with some body aches.  She states her younger grandson was recently hospitalized this weekend with 105 fever and he was being tested for COVID versus viral URI.  She thought initially she was having allergy-like symptoms with the runny nose, sore throat and coughing/sneezing, however her symptoms have progressed today.  The patient has not had a true fever at home, but feels subjective fever and chills.  She denies any chest pain or shortness of breath.  No Abdominal pain.  Over the last few days to a week or so when she is also having increased urinary frequency.  She also has some lower back/flank pain.  She states the last time she was diagnosed with COVID she had the similar symptoms.  She denies any hematuria or pain/burning with urination.  Patient reports a history of frequent UTIs and flank pain.  She states she has seen urology in the past who did a kidney ultrasound that did not show any abnormal pathology.  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   UTI (lower urinary tract infection)     Patient Active Problem List   Diagnosis Date Noted    Acute cystitis without hematuria 03/24/2020   Mixed stress and urge urinary incontinence 01/12/2018   Chronic pain of right knee 12/13/2016   Traumatic hematoma of right knee 12/13/2016   Chronic constipation 11/18/2016   Generalized abdominal pain 11/18/2016   Rectal bleeding 11/18/2016   Anxiety 09/20/2016   History of hysterectomy, supracervical 09/19/2016   Insomnia 10/20/2015   Herniated cervical disc 03/24/2013   Muscle spasms of head and/or neck 03/05/2013   Left shoulder pain 03/05/2013   Migraine headache without aura 03/05/2013    Past Surgical History:  Procedure Laterality Date   ABDOMINAL HYSTERECTOMY  08/27/2010   supracervical, blood transfusion   CHOLECYSTECTOMY  01/11/2013   Procedure: LAPAROSCOPIC CHOLECYSTECTOMY;  Surgeon: Shelly Rubenstein, MD;  Location: [facility];  Service: General;  Laterality: N/A;  Laparoscopic cholecystectomy   EYE SURGERY Left    at 67mos old   RENAL ARTERY STENT      OB History   No obstetric history on file.      Home Medications    Prior to Admission medications   Medication Sig Start Date End Date Taking? Authorizing Provider  nitrofurantoin, macrocrystal-monohydrate, (MACROBID) 100 MG capsule Take 1 capsule (100 mg total) by mouth 2 (two) times daily for 5 days. 07/30/21 08/04/21 Yes Madelyn Brunner, DO  Phenazopyridine HCl (AZO DINE PO) Take by mouth.  Yes [provider]  zolpidem (AMBIEN) 10 MG tablet TAKE 1/2 TO 1 TABLET BY MOUTH AT BEDTIME AS NEEDED 06/24/21  Yes Luking, Jonna Coup, MD  acyclovir (ZOVIRAX) 400 MG tablet TAKE 1 TABLET BY MOUTH 4 TIMES A DAY FOR 5 DAYS 12/18/20   [name], MD  cephALEXin (KEFLEX) 500 MG capsule Take one capsule po TID for 7 days 06/12/21   [name], MD  mupirocin ointment (BACTROBAN) 2 % Apply thin amount 2-3 times per day over the next week 06/12/21   [name], MD  ondansetron (ZOFRAN ODT) 8 MG disintegrating tablet Take 1 tablet (8 mg total) by mouth  every 8 (eight) hours as needed for nausea or vomiting. 12/18/20   [name], MD  ondansetron (ZOFRAN ODT) 8 MG disintegrating tablet Dissolve one tablet under tongue TID prn nausea 04/06/21   [name], MD  traMADol Janean Sark) 50 MG tablet Take one tablet po q 8 hrs prn HA 12/18/20   Luking, Jonna Coup, MD    Family History Family History  Problem Relation Age of Onset   Diabetes Mother    Cancer Father    Cancer Maternal Grandmother    Cirrhosis Maternal Grandfather     Social History Social History   Tobacco Use   Smoking status: Never   Smokeless tobacco: Never  Substance Use Topics   Alcohol use: No   Drug use: No     Allergies   Doxycycline, Hydrocodone, Macrolides and ketolides, and Trazodone and nefazodone   Review of Systems Review of Systems  Constitutional:  Positive for chills, fatigue and fever.  HENT:  Positive for congestion, rhinorrhea and sore throat. Negative for ear pain and trouble swallowing.   Respiratory:  Positive for cough. Negative for shortness of breath.   Cardiovascular:  Negative for chest pain and palpitations.  Gastrointestinal:  Negative for abdominal pain.  Genitourinary:  Positive for dysuria and frequency. Negative for decreased urine volume, difficulty urinating and hematuria.  Musculoskeletal:  Positive for back pain and myalgias.  Neurological:  Positive for headaches. Negative for dizziness.    Physical Exam Triage Vital Signs ED Triage Vitals  Enc Vitals Group     BP 07/30/21 1847 (!) 109/45     Pulse Rate 07/30/21 1847 98     Resp 07/30/21 1847 20     Temp 07/30/21 1847 98.7 F (37.1 C)     Temp Source 07/30/21 1847 Oral     SpO2 07/30/21 1847 100 %     Weight --      Height --      Head Circumference --      Peak Flow --      Pain Score 07/30/21 1849 5     Pain Loc --      Pain Edu? --      Excl. in GC? --    No data found.  Updated Vital Signs BP (!) 109/45   Pulse 98   Temp 98.7 F (37.1 C) (Oral)    Resp 20   SpO2 100%    Physical Exam Constitutional:      Appearance: She is not toxic-appearing.  HENT:     Head: Normocephalic and atraumatic.     Nose: Rhinorrhea present.     Mouth/Throat:     Mouth: Mucous membranes are moist. No oral lesions.     Pharynx: Posterior oropharyngeal erythema present. No oropharyngeal exudate.  Eyes:     Conjunctiva/sclera: Conjunctivae normal.  Pupils: Pupils are equal, round, and reactive to light.  Cardiovascular:     Rate and Rhythm: Normal rate and regular rhythm.     Heart sounds: Normal heart sounds.  Pulmonary:     Effort: Pulmonary effort is normal.     Breath sounds: No wheezing, rhonchi or rales.  Abdominal:     Palpations: Abdomen is soft.  Genitourinary:    Comments: No CVA tenderness Musculoskeletal:     Cervical back: Normal range of motion.  Skin:    General: Skin is warm.     Capillary Refill: Capillary refill takes less than 2 seconds.  Neurological:     Mental Status: She is alert.  Psychiatric:        Mood and Affect: Mood normal.        Behavior: Behavior normal.     UC Treatments / Results  Labs (all labs ordered are listed, but only abnormal results are displayed) Labs Reviewed  POCT URINALYSIS DIPSTICK, ED / UC - Abnormal; Notable for the following components:      Result Value   Leukocytes,Ua TRACE (*)    All other components within normal limits  SARS CORONAVIRUS 2 (TAT 6-24 HRS)  URINE CULTURE  POC URINE PREG, ED     Procedures Procedures (including critical care time)  Medications Ordered in UC Medications - No data to display  Initial Impression / Assessment and Plan / UC Course  I have reviewed the triage vital signs and the nursing notes.  Pertinent labs & imaging results that were available during my care of the patient were reviewed by me and considered in my medical decision making (see chart for details).     URI with cough and congestion - about 3 days of upper Uri symptoms  including HA, cough, rhinorrhea w/o chest pain or dyspnea.  In addition to body aches, symptoms concerning for COVID versus viral URI.  Sick contact of grandson over the weekend who required hospitalization.  Afebrile today in clinic.  Will test for COVID, will call with results.  Discussed supportive care that time.  Note for work until her test result returns. Acute cystitis -urinary frequency and low back pain in the setting of trace leukocytes, no blood.  Patient with long history of UTIs, has seen urology in the past.  Given her symptoms we will treat with Macrobid x5 days.  Urine culture obtained, will follow up to ensure antibiotic coverage is appropriate when results returns.  Precautions provided. Final Clinical Impressions(s) / UC Diagnoses   Final diagnoses:  Acute cystitis without hematuria  URI with cough and congestion  Chills   Discharge Instructions   None    ED Prescriptions     Medication Sig Dispense Auth. Provider   nitrofurantoin, macrocrystal-monohydrate, (MACROBID) 100 MG capsule Take 1 capsule (100 mg total) by mouth 2 (two) times daily for 5 days. 10 capsule Madelyn Brunner, DO      PDMP not reviewed this encounter.   Madelyn Brunner, DO 07/30/21 1954

## 2021-07-30 NOTE — ED Triage Notes (Signed)
Pt reports a sore throat with fever.Pt reports it is Strep.

## 2021-07-31 LAB — SARS CORONAVIRUS 2 (TAT 6-24 HRS): SARS Coronavirus 2: NEGATIVE

## 2021-08-01 ENCOUNTER — Telehealth: Payer: Self-pay | Admitting: Family Medicine

## 2021-08-01 ENCOUNTER — Encounter: Payer: Self-pay | Admitting: Family Medicine

## 2021-08-01 LAB — URINE CULTURE: Culture: >=100000 — AB

## 2021-08-01 NOTE — Telephone Encounter (Signed)
May have work note with return to work on Monday  Obviously if she has progressive illness and increasing shortness of breath she needs to get rechecked

## 2021-08-01 NOTE — Telephone Encounter (Signed)
Patient was seen at Urgent Care on Monday. She has an viral URI and she got a note from them which states she can return to work today. However she states that she still isn't feeling well and would like [place] to write a note to keep her out until Monday. She states that she can't hardly breathe without a mask on she knows she won't be able to with working for 8 hours with a mask.  CB# [phone]

## 2021-08-01 NOTE — Telephone Encounter (Signed)
Please advise. Thank you

## 2021-08-01 NOTE — Telephone Encounter (Signed)
Patient advised per Dr Lorin Picket: May have work note with return to work on Monday   Obviously if she has progressive illness and increasing shortness of breath she needs to get rechecked  Patient verbalized understanding.

## 2021-08-10 ENCOUNTER — Emergency Department (HOSPITAL_COMMUNITY): Payer: Worker's Compensation

## 2021-08-10 ENCOUNTER — Emergency Department (HOSPITAL_COMMUNITY)
Admission: EM | Admit: 2021-08-10 | Discharge: 2021-08-10 | Disposition: A | Discharge status: Home / Self Care | Payer: Worker's Compensation | Attending: Emergency Medicine | Admitting: Emergency Medicine

## 2021-08-10 DIAGNOSIS — Y990 Civilian activity done for income or pay: Secondary | ICD-10-CM | POA: Insufficient documentation

## 2021-08-10 DIAGNOSIS — Y9389 Activity, other specified: Secondary | ICD-10-CM | POA: Insufficient documentation

## 2021-08-10 DIAGNOSIS — X500XXA Overexertion from strenuous movement or load, initial encounter: Secondary | ICD-10-CM | POA: Insufficient documentation

## 2021-08-10 DIAGNOSIS — M778 Other enthesopathies, not elsewhere classified: Secondary | ICD-10-CM | POA: Diagnosis not present

## 2021-08-10 DIAGNOSIS — M25531 Pain in right wrist: Secondary | ICD-10-CM | POA: Diagnosis not present

## 2021-08-10 MED ORDER — NAPROXEN 500 MG PO TABS: 500.0000 mg | ORAL_TABLET | Freq: Two times a day (BID) | ORAL | 0 refills | Status: DC

## 2021-08-10 NOTE — ED Triage Notes (Signed)
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 Pain began Thursday night, burning & throbbing in wrist/forearm. Pt able to move hand, CMS intact distal to injury 8/10 pain level

## 2021-08-10 NOTE — Discharge Instructions (Addendum)
Take the prescribed medication as directed.  Wear wrist brace for now to keep wrist immobilized. Follow-up with Dr. Merlyn Lot if symptoms not improving-- call his office for appt if needed. Return to the ED for new or worsening symptoms.

## 2021-08-10 NOTE — ED Notes (Signed)
Patient verbalizes understanding of discharge instructions. Opportunity for questioning and answers were provided. Armband removed by staff, pt discharged from ED ambulatory.   

## 2021-08-10 NOTE — ED Provider Notes (Signed)
[facility] EMERGENCY DEPARTMENT Provider Note   CSN: [id number] Arrival date & time: 08/10/21  7414     History Chief Complaint  Patient presents with   Wrist Injury    Stephaie Dardis is a 40 y.o. female.  The history is provided by the patient and medical records.  Wrist Injury  40 y.o. F with hx of insomnia, renal disorder, migraine headaches, presenting to the ED for right wrist pain.  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  States a few days ago she was lifting 2-3 boxes at a time as she was "being rushed" and felt a "pop" in her wrist.  States over the past 2 to 3 days she has had worsening pain in her right wrist, exacerbated with any type of motion.  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  She states her manager sent her here for evaluation after completing her shift.  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   UTI (lower urinary tract infection)     Patient Active Problem List   Diagnosis Date Noted   Acute cystitis without hematuria 03/24/2020   Mixed stress and urge urinary incontinence 01/12/2018   Chronic pain of right knee 12/13/2016   Traumatic hematoma of right knee 12/13/2016   Chronic constipation 11/18/2016   Generalized abdominal pain 11/18/2016   Rectal bleeding 11/18/2016   Anxiety 09/20/2016   History of hysterectomy, supracervical 09/19/2016   Insomnia 10/20/2015   Herniated cervical disc 03/24/2013   Muscle spasms of head and/or neck 03/05/2013   Left shoulder pain 03/05/2013   Migraine headache without aura 03/05/2013    Past Surgical History:   Procedure Laterality Date   ABDOMINAL HYSTERECTOMY  08/27/2010   supracervical, blood transfusion   CHOLECYSTECTOMY  01/11/2013   Procedure: LAPAROSCOPIC CHOLECYSTECTOMY;  Surgeon: Shelly Rubenstein, MD;  Location: [facility];  Service: General;  Laterality: N/A;  Laparoscopic cholecystectomy   EYE SURGERY Left    at 63mos old   RENAL ARTERY STENT       OB History   No obstetric history on file.     Family History  Problem Relation Age of Onset   Diabetes Mother    Cancer Father    Cancer Maternal Grandmother    Cirrhosis Maternal Grandfather     Social History   Tobacco Use   Smoking status: Never   Smokeless tobacco: Never  Substance Use Topics   Alcohol use: No   Drug use: No    Home Medications Prior to Admission medications   Medication Sig Start Date End Date Taking? Authorizing Provider  naproxen (NAPROSYN) 500 MG tablet Take 1 tablet (500 mg total) by mouth 2 (two) times daily with a meal. 08/10/21  Yes Garlon Hatchet, PA-C  acyclovir (ZOVIRAX) 400 MG tablet TAKE 1 TABLET BY MOUTH 4 TIMES A DAY FOR 5 DAYS 12/18/20   [name], MD  cephALEXin (KEFLEX) 500 MG capsule Take one capsule po TID for 7 days 06/12/21   [name],  MD  mupirocin ointment (BACTROBAN) 2 % Apply thin amount 2-3 times per day over the next week 06/12/21   [name], MD  ondansetron (ZOFRAN ODT) 8 MG disintegrating tablet Take 1 tablet (8 mg total) by mouth every 8 (eight) hours as needed for nausea or vomiting. 12/18/20   [name], MD  ondansetron (ZOFRAN ODT) 8 MG disintegrating tablet Dissolve one tablet under tongue TID prn nausea 04/06/21   [name], MD  Phenazopyridine HCl (AZO DINE PO) Take by mouth.    [provider]  traMADol (ULTRAM) 50 MG tablet Take one tablet po q 8 hrs prn HA 12/18/20   Luking, Scott A, MD  zolpidem (AMBIEN) 10 MG tablet TAKE 1/2 TO 1 TABLET BY MOUTH AT BEDTIME AS NEEDED 06/24/21   [name], MD    Allergies     Doxycycline, Hydrocodone, Macrolides and ketolides, and Trazodone and nefazodone  Review of Systems   Review of Systems  Musculoskeletal:  Positive for arthralgias.  All other systems reviewed and are negative.  Physical Exam Updated Vital Signs BP 105/69 (BP Location: Left Arm)   Pulse 65   Temp 98.9 F (37.2 C) (Oral)   Resp 18   SpO2 100%   Physical Exam Vitals and nursing note reviewed.  Constitutional:      Appearance: She is well-developed.  HENT:     Head: Normocephalic and atraumatic.  Eyes:     Conjunctiva/sclera: Conjunctivae normal.     Pupils: Pupils are equal, round, and reactive to light.  Cardiovascular:     Rate and Rhythm: Normal rate and regular rhythm.     Heart sounds: Normal heart sounds.  Pulmonary:     Effort: Pulmonary effort is normal. No respiratory distress.     Breath sounds: Normal breath sounds. No rhonchi.  Abdominal:     General: Bowel sounds are normal.     Palpations: Abdomen is soft.  Musculoskeletal:        General: Normal range of motion.     Cervical back: Normal range of motion.     Comments: Right wrist overall normal in appearance, no visible swelling or bony deformity, pain noted with eversion/inversion of the wrist, + Finkelstein's test, radial pulse intact, normal grip strength, normal distal sensation and perfusion  Skin:    General: Skin is warm and dry.  Neurological:     Mental Status: She is alert and oriented to person, place, and time.    ED Results / Procedures / Treatments   Labs (all labs ordered are listed, but only abnormal results are displayed) Labs Reviewed - No data to display  EKG None  Radiology DG Forearm Right  Result Date: 08/10/2021 CLINICAL DATA:  Recent work injury with forearm pain, initial encounter EXAM: RIGHT FOREARM - 2 VIEW COMPARISON:  None. FINDINGS: There is no evidence of fracture or other focal bone lesions. Soft tissues are unremarkable. IMPRESSION: No acute abnormality noted.  Electronically Signed   By: Alcide Clever M.D.   On: 08/10/2021 01:25   DG Wrist Complete Right  Result Date: 08/10/2021 CLINICAL DATA:  Wrist injury while working with pain, initial encounter EXAM: RIGHT WRIST - COMPLETE 3+ VIEW COMPARISON:  None. FINDINGS: There is no evidence of fracture or dislocation. There is no evidence of arthropathy or other focal bone abnormality. Soft tissues are unremarkable. IMPRESSION: No acute abnormality noted. Electronically Signed   By: Alcide Clever M.D.   On: 08/10/2021 01:25    Procedures  Procedures   Medications Ordered in ED Medications - No data to display  ED Course  I have reviewed the triage vital signs and the nursing notes.  Pertinent labs & imaging results that were available during my care of the patient were reviewed by me and considered in my medical decision making (see chart for details).    MDM Rules/Calculators/A&P                           40 year old female here with right wrist pain.  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  Was lifting more than normal a few days ago and now with worsening pain in the wrist.  No significant swelling or bony deformity on exam.  There is pain with eversion and inversion of the wrist.  Positive Finkelstein's test.  Hand is neurovascularly intact with normal grip strength.  X-rays are negative.  I suspect this is likely tendinitis from repetitive use.  Will place in wrist brace, light duty for 1 week, start anti-inflammatories.  Will refer to hand specialist if no improvement the next 2 days.  Return here for new concerns.  Final Clinical Impression(s) / ED Diagnoses Final diagnoses:  Right wrist tendonitis    Rx / DC Orders ED Discharge Orders          Ordered    naproxen (NAPROSYN) 500 MG tablet  2 times daily with meals        08/10/21 0153             Garlon Hatchet, PA-C 08/10/21 0342    Sabas Sous, MD 08/10/21 [phone]

## 2021-08-13 ENCOUNTER — Telehealth: Payer: Self-pay | Admitting: *Deleted

## 2021-08-13 NOTE — Telephone Encounter (Signed)
Transition Care Management Follow-up Telephone Call Date of discharge and from where: 08/10/2021 - [facility] How have you been since you were released from the hospital? "My wrist is still hurting" Any questions or concerns? No  Items Reviewed: Did the pt receive and understand the discharge instructions provided? Yes  Medications obtained and verified? Yes  Other? No  Any new allergies since your discharge? No  Dietary orders reviewed? No Do you have support at home? Yes    Functional Questionnaire: (I = Independent and D = Dependent) ADLs: I  Bathing/Dressing- I  Meal Prep- I  Eating- I  Maintaining continence- I  Transferring/Ambulation- I  Managing Meds- I  Follow up appointments reviewed:  PCP Hospital f/u appt confirmed? No  - advised her that if her wrist kept hurting to call and make an appointment Specialist Hospital f/u appt confirmed? No   Are transportation arrangements needed? No  If their condition worsens, is the pt aware to call PCP or go to the Emergency Dept.? Yes Was the patient provided with contact information for the PCP's office or ED? Yes Was to pt encouraged to call back with questions or concerns? Yes

## 2021-08-31 ENCOUNTER — Ambulatory Visit (HOSPITAL_COMMUNITY)
Admission: EM | Admit: 2021-08-31 | Discharge: 2021-08-31 | Disposition: A | Discharge status: Home / Self Care | Payer: Medicaid Other | Attending: Emergency Medicine | Admitting: Emergency Medicine

## 2021-08-31 ENCOUNTER — Other Ambulatory Visit: Payer: Self-pay

## 2021-08-31 ENCOUNTER — Encounter (HOSPITAL_COMMUNITY): Payer: Self-pay | Admitting: Emergency Medicine

## 2021-08-31 DIAGNOSIS — J0110 Acute frontal sinusitis, unspecified: Secondary | ICD-10-CM

## 2021-08-31 MED ORDER — AMOXICILLIN-POT CLAVULANATE 875-125 MG PO TABS: 1.0000 | ORAL_TABLET | Freq: Two times a day (BID) | ORAL | 0 refills | Status: DC

## 2021-08-31 NOTE — ED Triage Notes (Signed)
Pt presents with cough, congestion, and sinus pain xs 2-3 days.

## 2021-08-31 NOTE — Discharge Instructions (Signed)
Take the augmentin 1 pill twice a day for the next 7 days.   You can take Tylenol and/or Ibuprofen as needed for pain relief and fever reduction.  You can use Flonase 1-2 sprays in each nostril to help with congestion.   Return or go to the Emergency Department if symptoms worsen or do not improve in the next few days.

## 2021-08-31 NOTE — ED Provider Notes (Signed)
[facility]    CSN: [id number] Arrival date & time: 08/31/21  1249      History   Chief Complaint Chief Complaint  Patient presents with   Sore Throat   Cough   Facial Pain   Nasal Congestion    HPI Heather Moon is a 40 y.o. female.   Patient here for evaluation of cough, congestion, and sinus pain that has been ongoing for the past 2 to 3 days.  Reports family has recently had similar symptoms.  Reports using OTC medication with minimal symptom relief.  Denies any trauma, injury, or other precipitating event.  Denies any specific alleviating or aggravating factors.  Denies any fevers, chest pain, shortness of breath, N/V/D, numbness, tingling, weakness, abdominal pain.    The history is provided by the patient.  Sore Throat  Cough Associated symptoms: sore throat   Associated symptoms: no fever    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   UTI (lower urinary tract infection)     Patient Active Problem List   Diagnosis Date Noted   Acute cystitis without hematuria 03/24/2020   Mixed stress and urge urinary incontinence 01/12/2018   Chronic pain of right knee 12/13/2016   Traumatic hematoma of right knee 12/13/2016   Chronic constipation 11/18/2016   Generalized abdominal pain 11/18/2016   Rectal bleeding 11/18/2016   Anxiety 09/20/2016   History of hysterectomy, supracervical 09/19/2016   Insomnia 10/20/2015   Herniated cervical disc 03/24/2013   Muscle spasms of head and/or neck 03/05/2013   Left shoulder pain 03/05/2013   Migraine headache without aura 03/05/2013    Past Surgical History:  Procedure Laterality Date   ABDOMINAL HYSTERECTOMY  08/27/2010    supracervical, blood transfusion   CHOLECYSTECTOMY  01/11/2013   Procedure: LAPAROSCOPIC CHOLECYSTECTOMY;  Surgeon: Shelly Rubenstein, MD;  Location: [facility];  Service: General;  Laterality: N/A;  Laparoscopic cholecystectomy   EYE SURGERY Left    at 49mos old   RENAL ARTERY STENT      OB History   No obstetric history on file.      Home Medications    Prior to Admission medications   Medication Sig Start Date End Date Taking? Authorizing Provider  amoxicillin-clavulanate (AUGMENTIN) 875-125 MG tablet Take 1 tablet by mouth every 12 (twelve) hours. 08/31/21  Yes Ivette Loyal, NP  acyclovir (ZOVIRAX) 400 MG tablet TAKE 1 TABLET BY MOUTH 4 TIMES A DAY FOR 5 DAYS 12/18/20   [name], MD  mupirocin ointment (BACTROBAN) 2 % Apply thin amount 2-3 times per day over the next week 06/12/21   [name], MD  naproxen (NAPROSYN) 500 MG tablet Take 1 tablet (500 mg total) by mouth 2 (two) times daily with a meal. 08/10/21   Garlon Hatchet, PA-C  ondansetron (ZOFRAN ODT) 8 MG disintegrating tablet Take 1 tablet (8 mg total) by mouth every 8 (eight) hours as needed for nausea or vomiting. 12/18/20   [name], MD  ondansetron (ZOFRAN ODT) 8 MG disintegrating tablet Dissolve one tablet under tongue TID prn nausea 04/06/21   [name], MD  Phenazopyridine HCl (AZO DINE PO) Take by mouth.    [provider]  traMADol Janean Sark) 50 MG tablet Take one tablet po q 8 hrs prn HA 12/18/20   Luking, Scott A, MD  zolpidem (AMBIEN) 10 MG tablet TAKE 1/2 TO 1 TABLET BY MOUTH AT BEDTIME AS NEEDED 06/24/21   [name], MD    Family History Family History  Problem Relation Age of Onset   Diabetes Mother    Cancer Father    Cancer Maternal Grandmother    Cirrhosis Maternal Grandfather     Social History Social History   Tobacco Use   Smoking status: Never   Smokeless tobacco: Never  Substance Use Topics   Alcohol use: No   Drug use: No     Allergies    Doxycycline, Hydrocodone, Macrolides and ketolides, and Trazodone and nefazodone   Review of Systems Review of Systems  Constitutional:  Negative for fever.  HENT:  Positive for congestion, sinus pressure, sinus pain and sore throat.   Respiratory:  Positive for cough.   All other systems reviewed and are negative.   Physical Exam Triage Vital Signs ED Triage Vitals  Enc Vitals Group     BP 08/31/21 1425 110/81     Pulse Rate 08/31/21 1425 71     Resp 08/31/21 1425 16     Temp 08/31/21 1425 98.6 F (37 C)     Temp Source 08/31/21 1425 Oral     SpO2 08/31/21 1425 97 %     Weight --      Height --      Head Circumference --      Peak Flow --      Pain Score 08/31/21 1420 0     Pain Loc --      Pain Edu? --      Excl. in GC? --    No data found.  Updated Vital Signs BP 110/81 (BP Location: Right Arm)   Pulse 71   Temp 98.6 F (37 C) (Oral)   Resp 16   SpO2 97%   Visual Acuity Right Eye Distance:   Left Eye Distance:   Bilateral Distance:    Right Eye Near:   Left Eye Near:    Bilateral Near:     Physical Exam Vitals and nursing note reviewed.  Constitutional:      General: She is not in acute distress.    Appearance: Normal appearance. She is not ill-appearing, toxic-appearing or diaphoretic.  HENT:     Head: Normocephalic and atraumatic.     Nose: Congestion and rhinorrhea present.     Right Turbinates: Swollen.     Left Turbinates: Swollen.     Right Sinus: Maxillary sinus tenderness and frontal sinus tenderness present.     Left Sinus: Maxillary sinus tenderness and frontal sinus tenderness present.     Mouth/Throat:     Pharynx: Uvula midline. Posterior oropharyngeal erythema present. No pharyngeal swelling.     Tonsils: No tonsillar exudate or tonsillar abscesses. 0 on the right. 0 on the left.  Eyes:     Conjunctiva/sclera: Conjunctivae normal.  Cardiovascular:     Rate and Rhythm: Normal rate.     Pulses: Normal pulses.  Pulmonary:      Effort: Pulmonary effort is normal.  Abdominal:     General: Abdomen is flat.  Musculoskeletal:        General: Normal range of motion.     Cervical back: Normal range of motion.  Skin:    General: Skin is warm and dry.  Neurological:     General: No focal deficit present.     Mental Status: She is alert and oriented to person, place, and time.  Psychiatric:        Mood and Affect: Mood normal.     UC Treatments / Results  Labs (all labs ordered are listed, but only abnormal results are displayed) Labs Reviewed - No data to display  EKG   Radiology No results found.  Procedures Procedures (including critical care time)  Medications Ordered in UC Medications - No data to display  Initial Impression / Assessment and Plan / UC Course  I have reviewed the triage vital signs and the nursing notes.  Pertinent labs & imaging results that were available during my care of the patient were reviewed by me and considered in my medical decision making (see chart for details).    Assessment negative for red flags or concerns.  Likely acute frontal sinusitis.  Will treat with Augmentin twice daily for the next 7 days.  Tylenol and/or ibuprofen as needed.  Also recommend Flonase 1 to 2 sprays in each nostril daily to help with congestion.  Follow-up as needed. Final Clinical Impressions(s) / UC Diagnoses   Final diagnoses:  Acute non-recurrent frontal sinusitis     Discharge Instructions      Take the augmentin 1 pill twice a day for the next 7 days.   You can take Tylenol and/or Ibuprofen as needed for pain relief and fever reduction.  You can use Flonase 1-2 sprays in each nostril to help with congestion.   Return or go to the Emergency Department if symptoms worsen or do not improve in the next few days.      ED Prescriptions     Medication Sig Dispense Auth. Provider   amoxicillin-clavulanate (AUGMENTIN) 875-125 MG tablet Take 1 tablet by mouth every 12 (twelve)  hours. 14 tablet Ivette Loyal, NP      PDMP not reviewed this encounter.   Ivette Loyal, NP 08/31/21 1510

## 2021-09-04 ENCOUNTER — Telehealth: Payer: Self-pay | Admitting: Family Medicine

## 2021-09-04 MED ORDER — CEFDINIR 300 MG PO CAPS: 300.0000 mg | ORAL_CAPSULE | Freq: Two times a day (BID) | ORAL | 0 refills | Status: DC

## 2021-09-04 NOTE — Telephone Encounter (Signed)
Patient would like prescription sent to pharmacy.  Prescription sent electronically to pharmacy. Patient would like work excuse sent to my chart.

## 2021-09-04 NOTE — Telephone Encounter (Signed)
Both we have a work note extended through the 21st  Actually the antibiotic they put her on was Augmentin which is amoxicillin with clavulanate which is actually one of the best drugs More than likely they are passing around a virus that will not necessarily get better with any antibiotic Certainly we can try 1 additional round Omnicef 300 mg 1 twice daily for 7 days If ongoing troubles follow-up

## 2021-09-04 NOTE — Telephone Encounter (Signed)
Heather Moon called and stated Seward Carol (daughter dob 04/07/2008) and herself have been sick, passing it back and forth in household. Dr. Lorin Picket wrote a note for her to be out of work until the 18th. Both Jasmine and Justyce are still out of work and both need a note to extend to 09/05/21. They both  have iChart and stated they could retreive note from there.   CB#  [phone]

## 2021-09-04 NOTE — Telephone Encounter (Signed)
Patient seen in urgent care 08/31/21 and they gave her antibiotic (the whole family has been sick with same illness)  Patient states her 1st day missed was last Tuesday the 13th and the note was to return to work 18th but she has been unable to return to work because it keeps passing back between family members  Patient states she is on Amoxil ( they have all been to urgent care several times because we were booked) but she says it has not helped and she wants a different antibiotic Patient needs work note extended till 09/05/21  Mom also states Jasmine needs work note 08/28/21- 09/05/21- she also said they didn't give her antibiotic  [facility]

## 2021-09-05 ENCOUNTER — Encounter: Payer: Self-pay | Admitting: Family Medicine

## 2021-09-12 ENCOUNTER — Other Ambulatory Visit: Payer: Self-pay | Admitting: Family Medicine

## 2021-09-13 NOTE — Telephone Encounter (Signed)
Sent my chart message 9/29

## 2021-10-02 ENCOUNTER — Ambulatory Visit: Payer: Medicaid Other | Admitting: Family Medicine

## 2021-10-02 ENCOUNTER — Encounter: Payer: Self-pay | Admitting: Family Medicine

## 2021-10-03 NOTE — Telephone Encounter (Signed)
Missed appointment.will need to reschedule

## 2021-10-17 NOTE — Telephone Encounter (Signed)
Missed last appointment has another appointment 11/9

## 2021-10-24 ENCOUNTER — Ambulatory Visit: Payer: Medicaid Other | Admitting: Family Medicine

## 2021-10-25 ENCOUNTER — Ambulatory Visit: Payer: Medicaid Other | Admitting: Family Medicine

## 2021-12-06 ENCOUNTER — Encounter: Payer: Self-pay | Admitting: Family Medicine

## 2022-01-21 ENCOUNTER — Ambulatory Visit (INDEPENDENT_AMBULATORY_CARE_PROVIDER_SITE_OTHER): Payer: Medicaid Other | Admitting: Family Medicine

## 2022-01-21 ENCOUNTER — Other Ambulatory Visit: Payer: Self-pay

## 2022-01-21 DIAGNOSIS — G4700 Insomnia, unspecified: Secondary | ICD-10-CM

## 2022-01-21 MED ORDER — ZOLPIDEM TARTRATE 10 MG PO TABS: 5.0000 mg | ORAL_TABLET | Freq: Every evening | ORAL | 5 refills | Status: DC | PRN

## 2022-01-21 NOTE — Progress Notes (Signed)
° °  Subjective:    Patient ID: Heather Moon, female    DOB: 1981/10/25, 41 y.o.   MRN: [id number]  HPI Significant insomnia off and on for years uses Ambien intermittently typically half tablet sometimes a full tablet states is necessary to help her sleep she does not use it every night she denies any other health problems currently trying to eat healthy and stay active    Review of Systems     Objective:   Physical Exam  Lungs clear heart regular pulse normal      Assessment & Plan:  Insomnia-refill sent in Follow-up 6 months If progressive troubles or problems to follow-up sooner Patient denies depression

## 2022-01-21 NOTE — Progress Notes (Signed)
° °  Subjective:    Patient ID: Heather Moon, female    DOB: [birth year], 41 y.o.   MRN: [id number]  HPI Follow up insomnia , med refill    Review of Systems     Objective:   Physical Exam        Assessment & Plan:

## 2022-01-22 ENCOUNTER — Ambulatory Visit: Payer: Medicaid Other | Admitting: Family Medicine

## 2022-01-22 ENCOUNTER — Telehealth: Payer: Self-pay | Admitting: Family Medicine

## 2022-01-22 MED ORDER — AZITHROMYCIN 250 MG PO TABS: ORAL_TABLET | ORAL | 0 refills | Status: AC

## 2022-01-22 NOTE — Telephone Encounter (Signed)
She may have a prescription for Z-Pak in this situation.  Please put on the Z-Pak under indication pharyngitis, follow-up if any ongoing troubles

## 2022-01-22 NOTE — Telephone Encounter (Signed)
Patient informed of md message and instructions. Verbalized understanding. Rx sent in.

## 2022-01-22 NOTE — Telephone Encounter (Signed)
Patient had daughter her yesterday and she was tested for strep throat,ow she states has headache and sore throat. She was advised to go to urgent care.

## 2022-04-25 ENCOUNTER — Encounter: Payer: Self-pay | Admitting: Family Medicine

## 2022-04-25 ENCOUNTER — Ambulatory Visit (INDEPENDENT_AMBULATORY_CARE_PROVIDER_SITE_OTHER): Payer: Medicaid Other | Admitting: Family Medicine

## 2022-04-25 VITALS — BP 120/80 | HR 63 | Temp 97.9°F | Wt 171.8 lb

## 2022-04-25 DIAGNOSIS — R300 Dysuria: Secondary | ICD-10-CM | POA: Diagnosis not present

## 2022-04-25 DIAGNOSIS — N261 Atrophy of kidney (terminal): Secondary | ICD-10-CM | POA: Insufficient documentation

## 2022-04-25 DIAGNOSIS — N3000 Acute cystitis without hematuria: Secondary | ICD-10-CM | POA: Diagnosis not present

## 2022-04-25 LAB — POCT URINALYSIS DIPSTICK
Protein, UA: POSITIVE — AB
Spec Grav, UA: 1.02 (ref 1.010–1.025)
pH, UA: 6.5 (ref 5.0–8.0)

## 2022-04-25 MED ORDER — CEPHALEXIN 500 MG PO CAPS: 500.0000 mg | ORAL_CAPSULE | Freq: Two times a day (BID) | ORAL | 0 refills | Status: DC

## 2022-04-25 NOTE — Progress Notes (Signed)
? ?Subjective:  ?Patient ID: Heather Moon, female    DOB: 1981/06/19  Age: 40 y.o. MRN: [id number] ? ?CC: ?Chief Complaint  ?Patient presents with  ? Urinary Tract Infection  ?  Kidney/Bladder infection per patient. Pt states she lives with kidney disease. Lower back pain, dark/cloudy urine, odor  ? ? ?HPI: ? ?41 year old female presents with the above complaints. ? ?Patient reports that she has had symptoms for over a week.  She reports urinary frequency and urgency.  She also reports associated low back pain and malodorous urine.  She has been using Azo intermittently.  Last dose was 2 days ago.  Patient has history of recurrent UTI.  She believes that she is experiencing a urinary tract infection.  No fever.  No flank pain.  No other complaints or concerns at this time. ? ?Patient Active Problem List  ? Diagnosis Date Noted  ? 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 Acute cystitis without hematuria 04/25/2022  ? Mixed stress and urge urinary incontinence 01/12/2018  ? Chronic pain of right knee 12/13/2016  ? Chronic constipation 11/18/2016  ? Anxiety 09/20/2016  ? History of hysterectomy, supracervical 09/19/2016  ? Insomnia 10/20/2015  ? Herniated cervical disc 03/24/2013  ? Migraine headache without aura 03/05/2013  ? ? ?Social Hx   ?Social History  ? ?Socioeconomic History  ? Marital status: Significant Other  ?  Spouse name: Not on file  ? Number of children: 5  ? Years of education: 10 th  ? Highest education level: Not on file  ?Occupational History  ?  Comment: Homemaker  ?Tobacco Use  ? Smoking status: Never  ? Smokeless tobacco: Never  ?Substance and Sexual Activity  ? Alcohol use: No  ? Drug use: No  ? Sexual activity: Yes  ?  Partners: Male  ?  Birth control/protection: Surgical  ?Other Topics Concern  ? Not on file  ?Social History Narrative  ? Patient lives at home with her boyfriend. Patient is a homemaker. Patient has 10 th grade education.  ? Caffeine- sweet tea three cups daily.  ? Left handed.  ? ?Social  Determinants of Health  ? ?Financial Resource Strain: Not on file  ?Food Insecurity: Not on file  ?Transportation Needs: Not on file  ?Physical Activity: Not on file  ?Stress: Not on file  ?Social Connections: Not on file  ? ? ?Review of Systems ?Per HPI ? ?Objective:  ?BP 120/80   Pulse 63   Temp 97.9 ?F (36.6 ?C)   Wt 171 lb 12.8 oz (77.9 kg)   SpO2 94%   BMI 29.49 kg/m?  ? ? ?  04/25/2022  ?  1:51 PM 08/31/2021  ?  2:25 PM 08/10/2021  ?  2:19 AM  ?BP/Weight  ?Systolic BP [id number]  ?Diastolic BP 80 81 69  ?Wt. (Lbs) 171.8    ?BMI 29.49 kg/m2    ? ? ?Physical Exam ?Vitals and nursing note reviewed.  ?Constitutional:   ?   General: She is not in acute distress. ?   Appearance: Normal appearance. She is not ill-appearing.  ?HENT:  ?   Head: Normocephalic and atraumatic.  ?Cardiovascular:  ?   Rate and Rhythm: Normal rate and regular rhythm.  ?Pulmonary:  ?   Effort: Pulmonary effort is normal.  ?   Breath sounds: Normal breath sounds. No wheezing, rhonchi or rales.  ?Abdominal:  ?   General: There is no distension.  ?   Palpations: Abdomen is soft.  ?   Tenderness:  There is no abdominal tenderness.  ?Neurological:  ?   Mental Status: She is alert.  ? ? ?Lab Results  ?Component Value Date  ? WBC 5.7 10/22/2016  ? HGB 14.1 10/22/2016  ? HCT 42.7 10/22/2016  ? PLT 191 10/22/2016  ? GLUCOSE 113 (H) 10/22/2016  ? CHOL 150 09/24/2016  ? TRIG 71 09/24/2016  ? HDL 58 09/24/2016  ? [place] 78 09/24/2016  ? ALT 21 10/22/2016  ? AST 26 10/22/2016  ? NA 137 10/22/2016  ? K 3.5 10/22/2016  ? CL 102 10/22/2016  ? CREATININE 1.33 (H) 10/22/2016  ? BUN 6 10/22/2016  ? CO2 26 10/22/2016  ? TSH 1.560 09/24/2016  ? INR 1.35 08/29/2010  ? ? ? ?Assessment & Plan:  ? ?Problem List Items Addressed This Visit   ? ?  ? Genitourinary  ? Acute cystitis without hematuria - Primary  ?  Urinalysis consistent with UTI.  Sending culture.  Placing on Keflex. ? ?  ?  ? Relevant Medications  ? cephALEXin (KEFLEX) 500 MG capsule  ? Other Relevant  Orders  ? POCT Urinalysis Dipstick (Completed)  ? Urine Culture  ? ? ?Meds ordered this encounter  ?Medications  ? cephALEXin (KEFLEX) 500 MG capsule  ?  Sig: Take 1 capsule (500 mg total) by mouth 2 (two) times daily.  ?  Dispense:  14 capsule  ?  Refill:  0  ? ?Thersa Salt DO ?[place] ? ?

## 2022-04-25 NOTE — Assessment & Plan Note (Signed)
Urinalysis consistent with UTI.  Sending culture.  Placing on Keflex. ?

## 2022-04-25 NOTE — Patient Instructions (Signed)
Lots of fluids. ° °Antibiotic as prescribed. ° °Take care ° °Dr. Braulio Kiedrowski  °

## 2022-04-30 ENCOUNTER — Telehealth: Payer: Self-pay | Admitting: *Deleted

## 2022-04-30 LAB — URINE CULTURE

## 2022-04-30 MED ORDER — SULFAMETHOXAZOLE-TRIMETHOPRIM 800-160 MG PO TABS: 1.0000 | ORAL_TABLET | Freq: Two times a day (BID) | ORAL | 0 refills | Status: AC

## 2022-05-07 ENCOUNTER — Other Ambulatory Visit: Payer: Self-pay | Admitting: Family Medicine

## 2022-05-10 ENCOUNTER — Ambulatory Visit (INDEPENDENT_AMBULATORY_CARE_PROVIDER_SITE_OTHER): Payer: Medicaid Other | Admitting: Nurse Practitioner

## 2022-05-10 ENCOUNTER — Other Ambulatory Visit (HOSPITAL_COMMUNITY): Payer: Self-pay | Admitting: Nurse Practitioner

## 2022-05-10 VITALS — BP 108/72 | HR 67 | Temp 97.4°F | Ht 63.5 in | Wt 171.6 lb

## 2022-05-10 DIAGNOSIS — K219 Gastro-esophageal reflux disease without esophagitis: Secondary | ICD-10-CM | POA: Diagnosis not present

## 2022-05-10 DIAGNOSIS — Z1151 Encounter for screening for human papillomavirus (HPV): Secondary | ICD-10-CM | POA: Diagnosis not present

## 2022-05-10 DIAGNOSIS — F419 Anxiety disorder, unspecified: Secondary | ICD-10-CM

## 2022-05-10 DIAGNOSIS — K5909 Other constipation: Secondary | ICD-10-CM

## 2022-05-10 DIAGNOSIS — Z01419 Encounter for gynecological examination (general) (routine) without abnormal findings: Secondary | ICD-10-CM

## 2022-05-10 DIAGNOSIS — G4700 Insomnia, unspecified: Secondary | ICD-10-CM

## 2022-05-10 DIAGNOSIS — Z124 Encounter for screening for malignant neoplasm of cervix: Secondary | ICD-10-CM | POA: Diagnosis not present

## 2022-05-10 DIAGNOSIS — Z1231 Encounter for screening mammogram for malignant neoplasm of breast: Secondary | ICD-10-CM

## 2022-05-10 DIAGNOSIS — Z90711 Acquired absence of uterus with remaining cervical stump: Secondary | ICD-10-CM

## 2022-05-10 MED ORDER — PANTOPRAZOLE SODIUM 40 MG PO TBEC: 40.0000 mg | DELAYED_RELEASE_TABLET | Freq: Every day | ORAL | 0 refills | Status: DC

## 2022-05-10 MED ORDER — ESCITALOPRAM OXALATE 10 MG PO TABS: 10.0000 mg | ORAL_TABLET | Freq: Every day | ORAL | 0 refills | Status: DC

## 2022-05-10 NOTE — Patient Instructions (Signed)
Food Choices for Gastroesophageal Reflux Disease, Adult When you have gastroesophageal reflux disease (GERD), the foods you eat and your eating habits are very important. Choosing the right foods can help ease the discomfort of GERD. Consider working with a dietitian to help you make healthy food choices. What are tips for following this plan? Reading food labels Look for foods that are low in saturated fat. Foods that have less than 5% of daily value (DV) of fat and 0 g of trans fats may help with your symptoms. Cooking Cook foods using methods other than frying. This may include baking, steaming, grilling, or broiling. These are all methods that do not need a lot of fat for cooking. To add flavor, try to use herbs that are low in spice and acidity. Meal planning  Choose healthy foods that are low in fat, such as fruits, vegetables, whole grains, low-fat dairy products, lean meats, fish, and poultry. Eat frequent, small meals instead of three large meals each day. Eat your meals slowly, in a relaxed setting. Avoid bending over or lying down until 2-3 hours after eating. Limit high-fat foods such as fatty meats or fried foods. Limit your intake of fatty foods, such as oils, butter, and shortening. Avoid the following as told by your health care provider: Foods that cause symptoms. These may be different for different people. Keep a food diary to keep track of foods that cause symptoms. Alcohol. Drinking large amounts of liquid with meals. Eating meals during the 2-3 hours before bed. Lifestyle Maintain a healthy weight. Ask your health care provider what weight is healthy for you. If you need to lose weight, work with your health care provider to do so safely. Exercise for at least 30 minutes on 5 or more days each week, or as told by your health care provider. Avoid wearing clothes that fit tightly around your waist and chest. Do not use any products that contain nicotine or tobacco. These  products include cigarettes, chewing tobacco, and vaping devices, such as e-cigarettes. If you need help quitting, ask your health care provider. Sleep with the head of your bed raised. Use a wedge under the mattress or blocks under the bed frame to raise the head of the bed. Chew sugar-free gum after mealtimes. What foods should I eat?  Eat a healthy, well-balanced diet of fruits, vegetables, whole grains, low-fat dairy products, lean meats, fish, and poultry. Each person is different. Foods that may trigger symptoms in one person may not trigger any symptoms in another person. Work with your health care provider to identify foods that are safe for you. The items listed above may not be a complete list of recommended foods and beverages. Contact a dietitian for more information. What foods should I avoid? Limiting some of these foods may help manage the symptoms of GERD. Everyone is different. Consult a dietitian or your health care provider to help you identify the exact foods to avoid, if any. Fruits Any fruits prepared with added fat. Any fruits that cause symptoms. For some people this may include citrus fruits, such as oranges, grapefruit, pineapple, and lemons. Vegetables Deep-fried vegetables. French fries. Any vegetables prepared with added fat. Any vegetables that cause symptoms. For some people, this may include tomatoes and tomato products, chili peppers, onions and garlic, and horseradish. Grains Pastries or quick breads with added fat. Meats and other proteins High-fat meats, such as fatty beef or pork, hot dogs, ribs, ham, sausage, salami, and bacon. Fried meat or protein, including   fried fish and fried chicken. Nuts and nut butters, in large amounts. Dairy Whole milk and chocolate milk. Sour cream. Cream. Ice cream. Cream cheese. Milkshakes. Fats and oils Butter. Margarine. Shortening. Ghee. Beverages Coffee and tea, with or without caffeine. Carbonated beverages. Sodas. Energy  drinks. Fruit juice made with acidic fruits, such as orange or grapefruit. Tomato juice. Alcoholic drinks. Sweets and desserts Chocolate and cocoa. Donuts. Seasonings and condiments Pepper. Peppermint and spearmint. Added salt. Any condiments, herbs, or seasonings that cause symptoms. For some people, this may include curry, hot sauce, or vinegar-based salad dressings. The items listed above may not be a complete list of foods and beverages to avoid. Contact a dietitian for more information. Questions to ask your health care provider Diet and lifestyle changes are usually the first steps that are taken to manage symptoms of GERD. If diet and lifestyle changes do not improve your symptoms, talk with your health care provider about taking medicines. Where to find more information International Foundation for Gastrointestinal Disorders: aboutgerd.org Summary When you have gastroesophageal reflux disease (GERD), food and lifestyle choices may be very helpful in easing the discomfort of GERD. Eat frequent, small meals instead of three large meals each day. Eat your meals slowly, in a relaxed setting. Avoid bending over or lying down until 2-3 hours after eating. Limit high-fat foods such as fatty meats or fried foods. This information is not intended to replace advice given to you by your health care provider. Make sure you discuss any questions you have with your health care provider. Document Revised: 06/12/2020 Document Reviewed: 06/12/2020 Elsevier Patient Education  2023 [employer] Inc.   Constipation, Adult Constipation is when a person has fewer than three bowel movements in a week, has difficulty having a bowel movement, or has stools (feces) that are dry, hard, or larger than normal. Constipation may be caused by an underlying condition. It may become worse with age if a person takes certain medicines and does not take in enough fluids. Follow these instructions at home: Eating and  drinking  Eat foods that have a lot of fiber, such as beans, whole grains, and fresh fruits and vegetables. Limit foods that are low in fiber and high in fat and processed sugars, such as fried or sweet foods. These include french fries, hamburgers, cookies, candies, and soda. Drink enough fluid to keep your urine pale yellow. General instructions Exercise regularly or as told by your health care provider. Try to do 150 minutes of moderate exercise each week. Use the bathroom when you have the urge to go. Do not hold it in. Take over-the-counter and prescription medicines only as told by your health care provider. This includes any fiber supplements. During bowel movements: Practice deep breathing while relaxing the lower abdomen. Practice pelvic floor relaxation. Watch your condition for any changes. Let your health care provider know about them. Keep all follow-up visits as told by your health care provider. This is important. Contact a health care provider if: You have pain that gets worse. You have a fever. You do not have a bowel movement after 4 days. You vomit. You are not hungry or you lose weight. You are bleeding from the opening between the buttocks (anus). You have thin, pencil-like stools. Get help right away if: You have a fever and your symptoms suddenly get worse. You leak stool or have blood in your stool. Your abdomen is bloated. You have severe pain in your abdomen. You feel dizzy or you faint. Summary Constipation  is when a person has fewer than three bowel movements in a week, has difficulty having a bowel movement, or has stools (feces) that are dry, hard, or larger than normal. Eat foods that have a lot of fiber, such as beans, whole grains, and fresh fruits and vegetables. Drink enough fluid to keep your urine pale yellow. Take over-the-counter and prescription medicines only as told by your health care provider. This includes any fiber supplements. This  information is not intended to replace advice given to you by your health care provider. Make sure you discuss any questions you have with your health care provider. Document Revised: 10/20/2019 Document Reviewed: 10/20/2019 Elsevier Patient Education  2023 [employer].

## 2022-05-10 NOTE — Progress Notes (Unsigned)
Subjective:    Patient ID: Heather Moon, female    DOB: May 27, 1981, 41 y.o.   MRN: [id number]  HPI  The patient comes in today for a wellness visit.    A review of their health history was completed.  A review of medications was also completed.  Any needed refills; needs refill on Zovirax  Eating habits: Sometimes has an appetite sometimes doesn't   Falls/  MVA accidents in past few months: No  Regular exercise: yes; "stays busy"  Specialist pt sees on regular basis: No  Preventative health issues were discussed.  Breast cancer: m aunt and m cousin  Additional concerns: A lot of stuff wants to talk about. Dealing with depression; GERD Had a supracervical hysterectomy for bleeding; same female partner; defers STD testing Regular eye exams; due for dental exam Treated recently for UTI; symptoms resolved History of atrophic kidney; patient states she had a stent placed years ago    05/10/2022   12:36 PM  Depression screen PHQ 2/9  Decreased Interest 3  Down, Depressed, Hopeless 3  PHQ - 2 Score 6  Altered sleeping 2  Tired, decreased energy 3  Change in appetite 3  Feeling bad or failure about yourself  3  Trouble concentrating 0  Moving slowly or fidgety/restless 3  Suicidal thoughts 0  PHQ-9 Score 20  Difficult doing work/chores Not difficult at all      05/10/2022   12:36 PM  GAD 7 : Generalized Anxiety Score  Nervous, Anxious, on Edge 3  Control/stop worrying 3  Worry too much - different things 3  Trouble relaxing 3  Restless 3  Easily annoyed or irritable 3  Afraid - awful might happen 3  Total GAD 7 Score 21  Anxiety Difficulty Not difficult at all   Under significant stress related to family issues particularly her son Denies suicidal or homicidal thoughts or ideation. Takes 1/2 Ambien tab about twice a week; daughter present during this part of history; patient and daughter report she is much more involved and more like herself on the days after  she takes this but it wears off  GERD: some acid reflux at night; will wake up choking; limited food intake; hungry but gets heartburn easily; throwing up at times; takes TUMS "like candy"; drinks large amount of caffeine including tea and Cheerwine; eats spicy foods; non smoker but occasional vape with nicotine; rare NSAID use; also has constipation requiring occasional Fleets enema  Review of Systems  Constitutional:  Positive for fatigue. Negative for activity change and appetite change.  HENT:  Negative for sore throat and trouble swallowing.   Respiratory:  Negative for cough, chest tightness, shortness of breath and wheezing.   Cardiovascular:  Negative for chest pain.  Gastrointestinal:  Positive for constipation, nausea and vomiting. Negative for abdominal distention, abdominal pain and diarrhea.  Genitourinary:  Negative for difficulty urinating, dysuria, frequency, genital sores, pelvic pain, urgency, vaginal bleeding and vaginal discharge.  Neurological:  Positive for headaches.       Describes headaches as "not bad"  Psychiatric/Behavioral:  Positive for sleep disturbance. Negative for suicidal ideas. The patient is nervous/anxious.       Objective:   Physical Exam Constitutional:      General: She is not in acute distress.    Appearance: She is well-developed.  Neck:     Thyroid: No thyromegaly.     Trachea: No tracheal deviation.     Comments: Thyroid non tender to palpation. No mass or goiter  noted.  Cardiovascular:     Rate and Rhythm: Normal rate and regular rhythm.     Heart sounds: Normal heart sounds. No murmur heard. Pulmonary:     Effort: Pulmonary effort is normal.     Breath sounds: Normal breath sounds.  Chest:  Breasts:    Right: No swelling, inverted nipple, mass, skin change or tenderness.     Left: No swelling, inverted nipple, mass, skin change or tenderness.  Abdominal:     General: There is no distension.     Palpations: Abdomen is soft. There is  no mass.     Tenderness: There is abdominal tenderness. There is no guarding or rebound.     Comments: Mild epigastric tenderness to deep palpation  Genitourinary:    Comments: External GU: no rashes or lesions; Vagina pink, no discharge; cervix normal in appearance; uterus absent; bimanual exam: no tenderness or obvious masses  Musculoskeletal:     Cervical back: Normal range of motion and neck supple.  Lymphadenopathy:     Cervical: No cervical adenopathy.     Upper Body:     Right upper body: No supraclavicular, axillary or pectoral adenopathy.     Left upper body: No supraclavicular, axillary or pectoral adenopathy.  Skin:    General: Skin is warm and dry.  Neurological:     Mental Status: She is alert and oriented to person, place, and time.  Psychiatric:        Mood and Affect: Mood normal.        Behavior: Behavior normal.        Thought Content: Thought content normal.        Judgment: Judgment normal.   Today's Vitals   05/10/22 1038  BP: 108/72  Pulse: 67  Temp: (!) 97.4 F (36.3 C)  TempSrc: Temporal  SpO2: 98%  Weight: 171 lb 9.6 oz (77.8 kg)  Height: 5' 3.5" (1.613 m)   Body mass index is 29.92 kg/m. No weight loss noted.       Assessment & Plan:   Problem List Items Addressed This Visit       Digestive   Chronic constipation   Gastroesophageal reflux disease without esophagitis   Relevant Medications   pantoprazole (PROTONIX) 40 MG tablet     Genitourinary   History of hysterectomy, supracervical     Other   Anxiety   Relevant Medications   escitalopram (LEXAPRO) 10 MG tablet   Other Relevant Orders   CBC with Differential (Completed)   Comprehensive Metabolic Panel (CMET) (Completed)   Lipid Profile (Completed)   TSH (Completed)   Hepatitis C Antibody (Completed)   Insomnia   Relevant Orders   CBC with Differential (Completed)   Comprehensive Metabolic Panel (CMET) (Completed)   Lipid Profile (Completed)   TSH (Completed)    Hepatitis C Antibody (Completed)   Other Visit Diagnoses     Well woman exam    -  Primary   Relevant Orders   CBC with Differential (Completed)   Comprehensive Metabolic Panel (CMET) (Completed)   Lipid Profile (Completed)   TSH (Completed)   Hepatitis C Antibody (Completed)   IGP, Aptima HPV   Screening for cervical cancer       Relevant Orders   CBC with Differential (Completed)   Comprehensive Metabolic Panel (CMET) (Completed)   Lipid Profile (Completed)   TSH (Completed)   Hepatitis C Antibody (Completed)   IGP, Aptima HPV   Screening for HPV (human papillomavirus)  Relevant Orders   CBC with Differential (Completed)   Comprehensive Metabolic Panel (CMET) (Completed)   Lipid Profile (Completed)   TSH (Completed)   Hepatitis C Antibody (Completed)   IGP, Aptima HPV   Screening mammogram for breast cancer          Labs and PAP smear pending. Meds ordered this encounter  Medications   pantoprazole (PROTONIX) 40 MG tablet    Sig: Take 1 tablet (40 mg total) by mouth daily. For acid reflux    Dispense:  30 tablet    Refill:  0    Order Specific Question:   Supervising Provider    Answer:   Lilyan Punt A [[id number]]   escitalopram (LEXAPRO) 10 MG tablet    Sig: Take 1 tablet (10 mg total) by mouth daily.    Dispense:  30 tablet    Refill:  0    Order Specific Question:   Supervising Provider    Answer:   Lilyan Punt A [[id number]]   Start Lexapro as directed. Reviewed potential side effects. DC med and contact office if any problems.  Start daily Protonix. Given written and verbal information on lifestyle factors affecting GERD. Discussed importance of stress reduction.  Discussed the affect of mental health on her physical complaints.  Return in about 1 month (around 06/10/2022).

## 2022-05-11 ENCOUNTER — Other Ambulatory Visit: Payer: Self-pay | Admitting: Nurse Practitioner

## 2022-05-11 DIAGNOSIS — N261 Atrophy of kidney (terminal): Secondary | ICD-10-CM

## 2022-05-11 LAB — COMPREHENSIVE METABOLIC PANEL
ALT: 18 IU/L (ref 0–32)
AST: 17 IU/L (ref 0–40)
Albumin/Globulin Ratio: 2 (ref 1.2–2.2)
Albumin: 4.5 g/dL (ref 3.8–4.8)
Alkaline Phosphatase: 93 IU/L (ref 44–121)
BUN/Creatinine Ratio: 6 — ABNORMAL LOW (ref 9–23)
BUN: 10 mg/dL (ref 6–24)
Bilirubin Total: 0.4 mg/dL (ref 0.0–1.2)
CO2: 27 mmol/L (ref 20–29)
Calcium: 9.6 mg/dL (ref 8.7–10.2)
Chloride: 105 mmol/L (ref 96–106)
Creatinine, Ser: 1.8 mg/dL — ABNORMAL HIGH (ref 0.57–1.00)
Globulin, Total: 2.2 g/dL (ref 1.5–4.5)
Glucose: 75 mg/dL (ref 70–99)
Potassium: 4.7 mmol/L (ref 3.5–5.2)
Sodium: 145 mmol/L — ABNORMAL HIGH (ref 134–144)
Total Protein: 6.7 g/dL (ref 6.0–8.5)
eGFR: 35 mL/min/{1.73_m2} — ABNORMAL LOW (ref >59)

## 2022-05-11 LAB — CBC WITH DIFFERENTIAL/PLATELET
Basophils Absolute: 0 10*3/uL (ref 0.0–0.2)
Basos: 1 %
EOS (ABSOLUTE): 0.2 10*3/uL (ref 0.0–0.4)
Eos: 3 %
Hematocrit: 39.3 % (ref 34.0–46.6)
Hemoglobin: 13.5 g/dL (ref 11.1–15.9)
Immature Grans (Abs): 0 10*3/uL (ref 0.0–0.1)
Immature Granulocytes: 0 %
Lymphocytes Absolute: 2.1 10*3/uL (ref 0.7–3.1)
Lymphs: 35 %
MCH: 31.2 pg (ref 26.6–33.0)
MCHC: 34.4 g/dL (ref 31.5–35.7)
MCV: 91 fL (ref 79–97)
Monocytes Absolute: 0.4 10*3/uL (ref 0.1–0.9)
Monocytes: 6 %
Neutrophils Absolute: 3.4 10*3/uL (ref 1.4–7.0)
Neutrophils: 55 %
Platelets: 234 10*3/uL (ref 150–450)
RBC: 4.33 x10E6/uL (ref 3.77–5.28)
RDW: 13 % (ref 11.7–15.4)
WBC: 6.1 10*3/uL (ref 3.4–10.8)

## 2022-05-11 LAB — LIPID PANEL
Chol/HDL Ratio: 3.3 ratio (ref 0.0–4.4)
Cholesterol, Total: 163 mg/dL (ref 100–199)
HDL: 50 mg/dL (ref >39)
LDL Chol Calc (NIH): 87 mg/dL (ref 0–99)
Triglycerides: 153 mg/dL — ABNORMAL HIGH (ref 0–149)
VLDL Cholesterol Cal: 26 mg/dL (ref 5–40)

## 2022-05-11 LAB — TSH: TSH: 2.42 u[IU]/mL (ref 0.450–4.500)

## 2022-05-11 LAB — HEPATITIS C ANTIBODY: Hep C Virus Ab: NONREACTIVE

## 2022-05-12 ENCOUNTER — Encounter: Payer: Self-pay | Admitting: Nurse Practitioner

## 2022-05-16 ENCOUNTER — Ambulatory Visit (HOSPITAL_COMMUNITY): Payer: Medicaid Other

## 2022-05-16 ENCOUNTER — Telehealth: Payer: Self-pay | Admitting: *Deleted

## 2022-05-16 NOTE — Telephone Encounter (Signed)
Per Eber Jones NP: Patient has not read my chart message below:  Your cholesterol, blood counts, thyroid all normal. Your hepatitis C test is negative. Sugar and liver tests are normal. Your kidney function dropped from last time. Your recent bladder infection may have contributed to this. As a precaution, hold on taking acyclovir and avoid taking any more AZO for your bladder. The other medications should be fine. Also avoid any NSAIDs such as Ibuprofen, Aleve or Goody Powders. I recommend a referral to a kidney specialist for a check up since you have not been seen in a long time. I will make Dr. Lorin Picket aware of your labs.  Let me know if you have any questions. Eber Jones

## 2022-05-16 NOTE — Telephone Encounter (Signed)
Results and provider recommendations discussed with patient. Patient verbalized understanding and stated she saw the results in my chart and is ok with seeing the kidney doctor.

## 2022-05-17 LAB — IGP, APTIMA HPV: HPV Aptima: NEGATIVE

## 2022-05-29 ENCOUNTER — Other Ambulatory Visit: Payer: Self-pay | Admitting: Nurse Practitioner

## 2022-06-28 ENCOUNTER — Ambulatory Visit: Payer: Medicaid Other | Admitting: Nurse Practitioner

## 2022-07-18 ENCOUNTER — Other Ambulatory Visit (HOSPITAL_COMMUNITY): Payer: Self-pay | Admitting: Nephrology

## 2022-07-18 ENCOUNTER — Other Ambulatory Visit: Payer: Self-pay | Admitting: Nephrology

## 2022-07-18 DIAGNOSIS — N270 Small kidney, unilateral: Secondary | ICD-10-CM | POA: Diagnosis not present

## 2022-07-18 DIAGNOSIS — Z7729 Contact with and (suspected ) exposure to other hazardous substances: Secondary | ICD-10-CM | POA: Diagnosis not present

## 2022-07-18 DIAGNOSIS — E870 Hyperosmolality and hypernatremia: Secondary | ICD-10-CM | POA: Diagnosis not present

## 2022-07-18 DIAGNOSIS — N170 Acute kidney failure with tubular necrosis: Secondary | ICD-10-CM | POA: Diagnosis not present

## 2022-07-18 DIAGNOSIS — N1832 Chronic kidney disease, stage 3b: Secondary | ICD-10-CM | POA: Diagnosis not present

## 2022-07-18 DIAGNOSIS — Z6828 Body mass index (BMI) 28.0-28.9, adult: Secondary | ICD-10-CM | POA: Diagnosis not present

## 2022-07-18 DIAGNOSIS — R809 Proteinuria, unspecified: Secondary | ICD-10-CM | POA: Diagnosis not present

## 2022-07-18 NOTE — Telephone Encounter (Signed)
error 

## 2022-07-22 DIAGNOSIS — E870 Hyperosmolality and hypernatremia: Secondary | ICD-10-CM | POA: Diagnosis not present

## 2022-07-22 DIAGNOSIS — R809 Proteinuria, unspecified: Secondary | ICD-10-CM | POA: Diagnosis not present

## 2022-07-22 DIAGNOSIS — N1832 Chronic kidney disease, stage 3b: Secondary | ICD-10-CM | POA: Diagnosis not present

## 2022-07-22 DIAGNOSIS — N170 Acute kidney failure with tubular necrosis: Secondary | ICD-10-CM | POA: Diagnosis not present

## 2022-07-22 DIAGNOSIS — N270 Small kidney, unilateral: Secondary | ICD-10-CM | POA: Diagnosis not present

## 2022-07-25 ENCOUNTER — Ambulatory Visit (HOSPITAL_COMMUNITY)
Admission: RE | Admit: 2022-07-25 | Discharge: 2022-07-25 | Disposition: A | Discharge status: Home / Self Care | Payer: Medicaid Other | Source: Ambulatory Visit | Attending: Nephrology | Admitting: Nephrology

## 2022-07-25 DIAGNOSIS — N261 Atrophy of kidney (terminal): Secondary | ICD-10-CM | POA: Diagnosis not present

## 2022-07-25 DIAGNOSIS — N1832 Chronic kidney disease, stage 3b: Secondary | ICD-10-CM | POA: Diagnosis not present

## 2022-07-25 DIAGNOSIS — N189 Chronic kidney disease, unspecified: Secondary | ICD-10-CM | POA: Diagnosis not present

## 2022-07-27 ENCOUNTER — Other Ambulatory Visit: Payer: Self-pay | Admitting: Family Medicine

## 2022-08-09 ENCOUNTER — Ambulatory Visit: Payer: Medicaid Other | Admitting: Nurse Practitioner

## 2022-08-09 VITALS — BP 105/66 | HR 74 | Temp 97.2°F | Ht 63.5 in

## 2022-08-09 DIAGNOSIS — G4700 Insomnia, unspecified: Secondary | ICD-10-CM | POA: Diagnosis not present

## 2022-08-09 DIAGNOSIS — F32A Depression, unspecified: Secondary | ICD-10-CM

## 2022-08-09 DIAGNOSIS — F419 Anxiety disorder, unspecified: Secondary | ICD-10-CM

## 2022-08-09 NOTE — Progress Notes (Unsigned)
   Subjective:    Patient ID: Heather Moon, female    DOB: 1981/06/30, 41 y.o.   MRN: [id number]  HPI  Patient stopped taking lexapro due to nausea and headache.  Continues to experience extreme anxiety symptoms along with depression due to her current family situation involving her son.  Would like to try different medication.  Has cut back on her Ambien to half a tab and lately that has caused a loss of memory and what she describes as general feeling of swelling.  Has tried melatonin with no relief. Patient has seen minimal improvement in any of her symptoms since her last visit.  Denies suicidal or homicidal thoughts or ideation.  Her nephew and daughter are present during the visit today per her request.  Defers being interviewed alone.  States she has a great support system in her daughter.  Review of Systems  Constitutional:  Positive for fatigue.  Respiratory:  Positive for chest tightness. Negative for shortness of breath.        Brief chest tightness at times with stress.  Unassociated specifically with activity.  Cardiovascular:  Negative for chest pain, palpitations and leg swelling.  Psychiatric/Behavioral:  Positive for decreased concentration and sleep disturbance. Negative for suicidal ideas. The patient is nervous/anxious.        Objective:   Physical Exam NAD.  Alert, oriented.  Mildly anxious affect.  Making good eye contact.  Slightly tearful at times.  Dressed appropriately for the weather.  Speech clear.  Thoughts logical coherent and relevant. Today's Vitals   08/09/22 1331  BP: 105/66  Pulse: 74  Temp: (!) 97.2 F (36.2 C)  SpO2: 98%  Height: 5' 3.5" (1.613 m)   Body mass index is 29.92 kg/m.        Assessment & Plan:   Problem List Items Addressed This Visit       Other   Anxiety and depression - Primary   Relevant Orders   Ambulatory referral to Psychiatry   Ambulatory referral to Psychology   Insomnia   Relevant Orders   Ambulatory referral  to Psychiatry   Hold on Ambien due to possible adverse effects. Patient referred to psychiatry and psychology.  Recommend that she start counseling due to difficult family situation. Trial of low-dose Prozac.  Again discussed potential adverse effects associated with an SSRI.  Patient to discontinue medication and contact office if any problems.  Verbally agrees to seek help immediately if any suicidal or homicidal thoughts or ideation. Return in about 1 month (around 09/09/2022). Recommend flu vaccine this fall.

## 2022-08-10 ENCOUNTER — Encounter: Payer: Self-pay | Admitting: Nurse Practitioner

## 2022-08-10 MED ORDER — FLUOXETINE HCL 10 MG PO CAPS: 10.0000 mg | ORAL_CAPSULE | Freq: Every day | ORAL | 0 refills | Status: DC

## 2022-08-29 DIAGNOSIS — N170 Acute kidney failure with tubular necrosis: Secondary | ICD-10-CM | POA: Diagnosis not present

## 2022-08-29 DIAGNOSIS — R768 Other specified abnormal immunological findings in serum: Secondary | ICD-10-CM | POA: Diagnosis not present

## 2022-08-29 DIAGNOSIS — N1832 Chronic kidney disease, stage 3b: Secondary | ICD-10-CM | POA: Diagnosis not present

## 2022-08-29 DIAGNOSIS — Z6829 Body mass index (BMI) 29.0-29.9, adult: Secondary | ICD-10-CM | POA: Diagnosis not present

## 2022-08-29 DIAGNOSIS — E211 Secondary hyperparathyroidism, not elsewhere classified: Secondary | ICD-10-CM | POA: Diagnosis not present

## 2022-09-19 ENCOUNTER — Ambulatory Visit (INDEPENDENT_AMBULATORY_CARE_PROVIDER_SITE_OTHER): Payer: Medicaid Other | Admitting: Student

## 2022-09-19 VITALS — BP 107/76 | HR 68 | Temp 98.6°F | Ht 65.0 in | Wt 166.0 lb

## 2022-09-19 DIAGNOSIS — F411 Generalized anxiety disorder: Secondary | ICD-10-CM

## 2022-09-20 ENCOUNTER — Encounter (HOSPITAL_COMMUNITY): Payer: Self-pay | Admitting: Student

## 2022-09-20 ENCOUNTER — Ambulatory Visit: Payer: Medicaid Other | Admitting: Nurse Practitioner

## 2022-09-20 NOTE — Progress Notes (Signed)
Psychiatric Initial Adult Assessment  Patient Identification: Heather Moon MRN:  [id number] Date of Evaluation:  09/20/2022 Referral Source: Wilfred Curtis, NP  Assessment:  Heather Moon is a 41 y.o. y.o. 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  Patient reports occasional anxiety and panic attacks that have resolved since her son left the house.  Psychiatric review of symptoms is otherwise negative as below.  No indication for medication at this time.  Patient was scheduled for a therapy appointment virtually.  Patient was given contact information for behavioral health clinic and was instructed to call 911 for emergencies.   Subjective:  Chief Complaint:  Chief Complaint  Patient presents with   Anxiety    History of Present Illness:   On interview today, the patient reports that she is interested in receiving counseling and has "no family who can listen to me".  She reports anxiety that is occasionally triggered by minor events.  She reports previously experiencing panic attacks but states that this has not occurred since her son left the home to live with his father.  She denies experiencing any nightmares or flashbacks or other PTSD symptoms.  She denies experiencing a manic episode when this is described.  She denies problems with concentration or depression.  She denies experiencing suicidal thoughts or anhedonia.  Regarding social history, the patient reports that her son is "a borderline schizophrenic".  She states that dealing with him is caused her a great deal of stress but that he moved out 1 month ago to live with his father and this is resolved many of her mental health problems.  She reports that her daughter pays the bills that her house.  She states that she takes care of  her 22-year-old grandson for much of the day.  She denies using any psychoactive substances.   Past Psychiatric History: as above  Previous Psychotropic Medications: Yes   Substance Abuse History in the last 12 months:  No.  Consequences of Substance Abuse: NA  Past Medical History:  Past Medical History:  Diagnosis Date   Blood transfusion without reported diagnosis    Cholelithiasis    Chronic insomnia    Constipation, chronic    GERD (gastroesophageal reflux disease)    takes Prilosec as needed   Kidney infection    Migraine    Migraine headache    Renal disorder    stent to L kidney since 41 yo   UTI (lower urinary tract infection)     Past Surgical History:  Procedure Laterality Date   ABDOMINAL HYSTERECTOMY  08/27/2010   supracervical, blood transfusion   CHOLECYSTECTOMY  01/11/2013   Procedure: LAPAROSCOPIC CHOLECYSTECTOMY;  Surgeon: Harl Bowie, MD;  Location: [facility];  Service: General;  Laterality: N/A;  Laparoscopic cholecystectomy   EYE SURGERY Left    at 11mos old   RENAL ARTERY STENT      Family Psychiatric History: Son with schizophrenia, otherwise denies issues  Family History:  Family History  Problem Relation Age of Onset   Diabetes Mother    Cancer Father    Cancer Maternal Grandmother    Cirrhosis Maternal Grandfather     Social History:   Social History   Socioeconomic History   Marital status: Significant Other    Spouse name: Not on file   Number of children:  5   Years of education: 10 th   Highest education level: Not on file  Occupational History    Comment: Homemaker  Tobacco Use   Smoking status: Never   Smokeless tobacco: Never  Substance and Sexual Activity   Alcohol use: No   Drug use: No   Sexual activity: Yes    Partners: Male    Birth control/protection: Surgical  Other Topics Concern   Not on file  Social History Narrative   Patient lives at home with her boyfriend. Patient is a homemaker.  Patient has 10 th grade education.   Caffeine- sweet tea three cups daily.   Left handed.   Social Determinants of Health   Financial Resource Strain: Not on file  Food Insecurity: Not on file  Transportation Needs: Not on file  Physical Activity: Not on file  Stress: Not on file  Social Connections: Not on file    Additional Social History: none  Allergies:   Allergies  Allergen Reactions   Doxycycline Hives   Hydrocodone Nausea And Vomiting   Lexapro [Escitalopram] Nausea Only    Experienced headache and nausea    Macrolides And Ketolides Nausea Only   Trazodone And Nefazodone Other (See Comments)    Headaches    Current Medications: Current Outpatient Medications  Medication Sig Dispense Refill   acyclovir (ZOVIRAX) 400 MG tablet TAKE 1 TABLET BY MOUTH 4 TIMES A DAY FOR 5 DAYS (Patient not taking: Reported on 09/20/2022) 20 tablet 6   FLUoxetine (PROZAC) 10 MG capsule Take 1 capsule (10 mg total) by mouth daily. (Patient not taking: Reported on 09/20/2022) 30 capsule 0   pantoprazole (PROTONIX) 40 MG tablet TAKE 1 TABLET (40 MG TOTAL) BY MOUTH DAILY. FOR ACID REFLUX (Patient not taking: Reported on 09/20/2022) 30 tablet 0   zolpidem (AMBIEN) 10 MG tablet TAKE 0.5-1 TABLETS (5-10 MG TOTAL) BY MOUTH AT BEDTIME AS NEEDED. (Patient not taking: Reported on 09/20/2022) 15 tablet 2   No current facility-administered medications for this visit.    Objective:  Psychiatric Specialty Exam: Physical Exam Constitutional:      Appearance: the patient is not toxic-appearing.  Pulmonary:     Effort: Pulmonary effort is normal.  Neurological:     General: No focal deficit present.     Mental Status: the patient is alert and oriented to person, place, and time.   Review of Systems  Respiratory:  Negative for shortness of breath.   Cardiovascular:  Negative for chest pain.  Gastrointestinal:  Negative for abdominal pain, constipation, diarrhea, nausea and vomiting.  Neurological:   Negative for headaches.      BP 107/76   Pulse 68   Temp 98.6 F (37 C)   Ht 5\' 5"  (1.651 m)   Wt 166 lb (75.3 kg)   SpO2 98%   BMI 27.62 kg/m   General Appearance: Fairly Groomed  Eye Contact:  Good  Speech:  Clear and Coherent  Volume:  Normal  Mood:  Euthymic  Affect:  Congruent  Thought Process:  Coherent  Orientation:  Full (Time, Place, and Person)  Thought Content: Logical   Suicidal Thoughts:  No  Homicidal Thoughts:  No  Memory:  Immediate;   Good  Judgement:  Good  Insight:  Good  Psychomotor Activity:  Normal  Concentration:  Concentration: Good  Recall:  Good  Fund of Knowledge: Good  Language: Good  Akathisia:  No  Handed:    AIMS (if indicated): not done  Assets:  Manufacturing systems engineer  Desire for Improvement Financial Resources/Insurance Housing Leisure Time Physical Health  ADL's:  Intact  Cognition: WNL  Sleep:  Fair     Metabolic Disorder Labs: No results found for: "HGBA1C", "MPG" No results found for: "PROLACTIN" Lab Results  Component Value Date   CHOL 163 05/10/2022   TRIG 153 (H) 05/10/2022   HDL 50 05/10/2022   CHOLHDL 3.3 05/10/2022   LDLCALC 87 05/10/2022   [place] 78 09/24/2016   Lab Results  Component Value Date   TSH 2.420 05/10/2022    Therapeutic Level Labs: No results found for: "LITHIUM" No results found for: "CBMZ" No results found for: "VALPROATE"  Screenings:  [facility] Office Visit from 05/10/2022 in [facility]  Total GAD-7 Score 21      PHQ2-9    [place] Office Visit from 09/19/2022 in [facility] Office Visit from 05/10/2022 in [facility] from 12/18/2020 in [place]  PHQ-2 Total Score 0 6 0  PHQ-9 Total Score -- 20 --      [place] ED from 08/31/2021 in [facility] ED from 08/10/2021 in [place] ED from 07/30/2021 in [facility] at [place] Error: Question 6 not populated No Risk No Risk       Collaboration of Care: Collaboration of Care: Other none performed.   Patient/Guardian was advised Release of Information must be obtained prior to any record release in order to collaborate their care with an outside provider. Patient/Guardian was advised if they have not already done so to contact the registration department to sign all necessary forms in order for [place] to release information regarding their care.   Consent: Patient/Guardian gives verbal consent for treatment and assignment of benefits for services provided during this visit. Patient/Guardian expressed understanding and agreed to proceed.   A total of 60 minutes was spent involved in face to face clinical care, chart review, documentation.  Corky Sox, MD 10/6/20232:02 PM

## 2022-09-26 ENCOUNTER — Other Ambulatory Visit: Payer: Self-pay | Admitting: Nurse Practitioner

## 2022-09-26 ENCOUNTER — Ambulatory Visit (INDEPENDENT_AMBULATORY_CARE_PROVIDER_SITE_OTHER): Payer: Medicaid Other | Admitting: Licensed Clinical Social Worker

## 2022-09-26 DIAGNOSIS — F411 Generalized anxiety disorder: Secondary | ICD-10-CM

## 2022-09-26 DIAGNOSIS — F331 Major depressive disorder, recurrent, moderate: Secondary | ICD-10-CM
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
## 2022-10-10 ENCOUNTER — Ambulatory Visit (HOSPITAL_COMMUNITY): Payer: Medicaid Other | Admitting: Licensed Clinical Social Worker

## 2022-10-18 ENCOUNTER — Ambulatory Visit: Payer: Medicaid Other | Admitting: Nurse Practitioner

## 2022-11-01 ENCOUNTER — Ambulatory Visit: Payer: Medicaid Other | Admitting: Nurse Practitioner

## 2022-11-12 ENCOUNTER — Ambulatory Visit (HOSPITAL_COMMUNITY)
Admission: EM | Admit: 2022-11-12 | Discharge: 2022-11-12 | Disposition: A | Discharge status: Home / Self Care | Payer: Medicaid Other | Attending: Family Medicine | Admitting: Family Medicine

## 2022-11-12 ENCOUNTER — Encounter (HOSPITAL_COMMUNITY): Payer: Self-pay | Admitting: Emergency Medicine

## 2022-11-12 DIAGNOSIS — R051 Acute cough: Secondary | ICD-10-CM | POA: Diagnosis not present

## 2022-11-12 DIAGNOSIS — R0981 Nasal congestion: Secondary | ICD-10-CM | POA: Diagnosis not present

## 2022-11-12 MED ORDER — PREDNISONE 20 MG PO TABS: 40.0000 mg | ORAL_TABLET | Freq: Every day | ORAL | 0 refills | Status: DC

## 2022-11-12 NOTE — ED Triage Notes (Signed)
Since Thanksgiving having migraines, cough and congestion. Reports has mold in bathroom and all family members are sick. Took Dayquil yesterday and some tylenol for headache.

## 2022-11-13 NOTE — ED Provider Notes (Signed)
[place]   [id number] 11/12/22 Arrival Time: [place]:  1. Acute cough   2. Nasal congestion    Discussed typical duration of likely viral illness. Question mold-related symptoms/allergy. Discussed. OTC symptom care as needed. Trial of: Discharge Medication List as of 11/12/2022  8:18 PM     START taking these medications   Details  predniSONE (DELTASONE) 20 MG tablet Take 2 tablets (40 mg total) by mouth daily., Starting Tue 11/12/2022, Normal       No resp distress.   Follow-up Information     [place], Elayne Snare, MD.   Specialty: Family Medicine Why: If worsening or failing to improve as anticipated. Contact information: [address] [place] [address] [phone]                 Reviewed expectations re: course of current medical issues. Questions answered. Outlined signs and symptoms indicating need for more acute intervention. Understanding verbalized. After Visit Summary given.   SUBJECTIVE: History from: Patient. Heather Moon is a 41 y.o. female. Reports: Since Thanksgiving having migraines, cough and congestion. Reports has mold in bathroom and all family members are sick. Took Dayquil yesterday and some tylenol for headache.  Denies: fever. Normal PO intake without n/v/d.  OBJECTIVE:  Vitals:   11/12/22 1944  BP: 104/76  Pulse: 84  Resp: 17  Temp: 98.7 F (37.1 C)  SpO2: 97%    General appearance: alert; no distress Eyes: PERRLA; EOMI; conjunctiva normal HENT: [place]; AT; with nasal congestion; throat with cobblestoning Neck: supple  Lungs: speaks full sentences without difficulty; unlabored; CTAB Extremities: no edema Skin: warm and dry Neurologic: normal gait Psychological: alert and cooperative; normal mood and affect  Labs: Results for orders placed or performed in visit on 05/10/22  CBC with Differential  Result Value Ref Range   WBC 6.1 3.4 - 10.8 x10E3/uL   RBC 4.33 3.77 - 5.28  x10E6/uL   Hemoglobin 13.5 11.1 - 15.9 g/dL   Hematocrit 39.3 34.0 - 46.6 %   MCV 91 79 - 97 fL   MCH 31.2 26.6 - 33.0 pg   MCHC 34.4 31.5 - 35.7 g/dL   RDW 13.0 11.7 - 15.4 %   Platelets 234 150 - 450 x10E3/uL   Neutrophils 55 Not Estab. %   Lymphs 35 Not Estab. %   Monocytes 6 Not Estab. %   Eos 3 Not Estab. %   Basos 1 Not Estab. %   Neutrophils Absolute 3.4 1.4 - 7.0 x10E3/uL   Lymphocytes Absolute 2.1 0.7 - 3.1 x10E3/uL   Monocytes Absolute 0.4 0.1 - 0.9 x10E3/uL   EOS (ABSOLUTE) 0.2 0.0 - 0.4 x10E3/uL   Basophils Absolute 0.0 0.0 - 0.2 x10E3/uL   Immature Granulocytes 0 Not Estab. %   Immature Grans (Abs) 0.0 0.0 - 0.1 x10E3/uL  Comprehensive Metabolic Panel (CMET)  Result Value Ref Range   Glucose 75 70 - 99 mg/dL   BUN 10 6 - 24 mg/dL   Creatinine, Ser 1.80 (H) 0.57 - 1.00 mg/dL   eGFR 35 (L) >59 mL/min/1.73   BUN/Creatinine Ratio 6 (L) 9 - 23   Sodium 145 (H) 134 - 144 mmol/L   Potassium 4.7 3.5 - 5.2 mmol/L   Chloride 105 96 - 106 mmol/L   CO2 27 20 - 29 mmol/L   Calcium 9.6 8.7 - 10.2 mg/dL   Total Protein 6.7 6.0 - 8.5 g/dL   Albumin 4.5 3.8 - 4.8 g/dL  Globulin, Total 2.2 1.5 - 4.5 g/dL   Albumin/Globulin Ratio 2.0 1.2 - 2.2   Bilirubin Total 0.4 0.0 - 1.2 mg/dL   Alkaline Phosphatase 93 44 - 121 IU/L   AST 17 0 - 40 IU/L   ALT 18 0 - 32 IU/L  Lipid Profile  Result Value Ref Range   Cholesterol, Total 163 100 - 199 mg/dL   Triglycerides 153 (H) 0 - 149 mg/dL   HDL 50 >39 mg/dL   VLDL Cholesterol Cal 26 5 - 40 mg/dL   LDL Chol Calc (NIH) 87 0 - 99 mg/dL   Chol/HDL Ratio 3.3 0.0 - 4.4 ratio  TSH  Result Value Ref Range   TSH 2.420 0.450 - 4.500 uIU/mL  Hepatitis C Antibody  Result Value Ref Range   Hep C Virus Ab Non Reactive Non Reactive  IGP, Aptima HPV  Result Value Ref Range   Interpretation NILM    Category NIL    [facility] ENDO    Clinician Provided ICD10 Comment    Performed by: Comment    Note: Comment    Test Methodology Comment     HPV Aptima Negative Negative   Labs Reviewed - No data to display  Imaging: No results found.  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 Patient is a homemaker. Patient has 10 th grade education.   Caffeine- sweet tea three cups daily.   Left handed.   Social Determinants of Health   Financial Resource Strain: Low Risk  (09/26/2022)   Overall Financial Resource Strain (CARDIA)    Difficulty of Paying Living Expenses: Not very hard  Food Insecurity: No Food Insecurity (09/27/2022)   Hunger Vital Sign    Worried About Running Out of Food in the Last Year: Never true    Ran Out of Food in the Last Year: Never true  Transportation Needs: No Transportation Needs (09/26/2022)   PRAPARE - Hydrologist (Medical): No     Lack of Transportation (Non-Medical): No  Physical Activity: Sufficiently Active (09/26/2022)   Exercise Vital Sign    Days of Exercise  per Week: 7 days    Minutes of Exercise per Session: 30 min  Stress: Stress Concern Present (09/26/2022)   [place]    Feeling of Stress : Very much  Social Connections: Moderately Isolated (09/26/2022)   Social Connection and Isolation Panel [NHANES]    Frequency of Communication with Friends and Family: Three times a week    Frequency of Social Gatherings with Friends and Family: More than three times a week    Attends Religious Services: Never    Marine scientist or Organizations: No    Attends Archivist Meetings: Never    Marital Status: Living with partner  Intimate Partner Violence: Not on file   Family History  Problem Relation Age of Onset   Diabetes Mother    Cancer Father    Cancer Maternal Grandmother    Cirrhosis Maternal Grandfather    Past Surgical History:  Procedure Laterality Date   ABDOMINAL HYSTERECTOMY  08/27/2010   supracervical, blood transfusion   CHOLECYSTECTOMY  01/11/2013   Procedure: LAPAROSCOPIC CHOLECYSTECTOMY;  Surgeon: Harl Bowie, MD;  Location: [facility];  Service: General;  Laterality: N/A;  Laparoscopic cholecystectomy   EYE SURGERY Left    at 25mo old   RENAL ARTERY S[place]arr Lake MD 11/13/22 1424

## 2022-11-15 ENCOUNTER — Ambulatory Visit: Payer: Medicaid Other | Admitting: Nurse Practitioner

## 2022-12-20 ENCOUNTER — Telehealth: Payer: Self-pay | Admitting: Family Medicine

## 2022-12-20 DIAGNOSIS — N1832 Chronic kidney disease, stage 3b: Secondary | ICD-10-CM | POA: Diagnosis not present

## 2022-12-20 DIAGNOSIS — E211 Secondary hyperparathyroidism, not elsewhere classified: Secondary | ICD-10-CM | POA: Diagnosis not present

## 2022-12-20 DIAGNOSIS — N170 Acute kidney failure with tubular necrosis: Secondary | ICD-10-CM | POA: Diagnosis not present

## 2022-12-20 DIAGNOSIS — R768 Other specified abnormal immunological findings in serum: Secondary | ICD-10-CM | POA: Diagnosis not present

## 2022-12-20 MED ORDER — VALACYCLOVIR HCL 1 G PO TABS: ORAL_TABLET | ORAL | 3 refills | Status: DC

## 2022-12-20 NOTE — Telephone Encounter (Signed)
Prescription sent electronically to pharmacy. Patient notified. 

## 2022-12-20 NOTE — Telephone Encounter (Signed)
Valacyclovir works better Valacyclovir 1 g Take 2 pills now and repeat 2 pills in 12 hours this typically will take care of the problem #4 with 3 refills  If she continues to have frequent outbreaks in some cases taking a lower dose on a daily basis can suppress the frequency of these outbreaks

## 2022-12-20 NOTE — Telephone Encounter (Signed)
Patient still has fever blister on the corners of her mouth. [facility]

## 2022-12-23 DIAGNOSIS — R809 Proteinuria, unspecified: Secondary | ICD-10-CM | POA: Diagnosis not present

## 2022-12-23 DIAGNOSIS — Z6826 Body mass index (BMI) 26.0-26.9, adult: Secondary | ICD-10-CM | POA: Diagnosis not present

## 2022-12-23 DIAGNOSIS — E211 Secondary hyperparathyroidism, not elsewhere classified: Secondary | ICD-10-CM | POA: Diagnosis not present

## 2022-12-23 DIAGNOSIS — N1832 Chronic kidney disease, stage 3b: Secondary | ICD-10-CM | POA: Diagnosis not present

## 2022-12-26 ENCOUNTER — Other Ambulatory Visit: Payer: Self-pay | Admitting: Family Medicine

## 2023-01-24 ENCOUNTER — Ambulatory Visit: Payer: Medicaid Other | Admitting: Family Medicine

## 2023-02-23 ENCOUNTER — Emergency Department (HOSPITAL_COMMUNITY): Payer: Medicaid Other

## 2023-02-23 ENCOUNTER — Inpatient Hospital Stay (HOSPITAL_COMMUNITY)
Admission: EM | Admit: 2023-02-23 | Discharge: 2023-02-26 | DRG: 872 | Disposition: A | Discharge status: Home / Self Care | Payer: Medicaid Other | Attending: Internal Medicine | Admitting: Internal Medicine

## 2023-02-23 DIAGNOSIS — R509 Fever, unspecified: Secondary | ICD-10-CM | POA: Diagnosis not present

## 2023-02-23 DIAGNOSIS — K5909 Other constipation: Secondary | ICD-10-CM | POA: Diagnosis present

## 2023-02-23 DIAGNOSIS — Z0389 Encounter for observation for other suspected diseases and conditions ruled out: Secondary | ICD-10-CM | POA: Diagnosis not present

## 2023-02-23 DIAGNOSIS — N200 Calculus of kidney: Secondary | ICD-10-CM | POA: Diagnosis present

## 2023-02-23 DIAGNOSIS — Z90710 Acquired absence of both cervix and uterus: Secondary | ICD-10-CM

## 2023-02-23 DIAGNOSIS — R6520 Severe sepsis without septic shock: Secondary | ICD-10-CM | POA: Diagnosis present

## 2023-02-23 DIAGNOSIS — R739 Hyperglycemia, unspecified: Secondary | ICD-10-CM | POA: Diagnosis not present

## 2023-02-23 DIAGNOSIS — Z91138 Patient's unintentional underdosing of medication regimen for other reason: Secondary | ICD-10-CM

## 2023-02-23 DIAGNOSIS — Z1152 Encounter for screening for COVID-19: Secondary | ICD-10-CM

## 2023-02-23 DIAGNOSIS — Z881 Allergy status to other antibiotic agents status: Secondary | ICD-10-CM

## 2023-02-23 DIAGNOSIS — R519 Headache, unspecified: Secondary | ICD-10-CM | POA: Diagnosis not present

## 2023-02-23 DIAGNOSIS — Z79899 Other long term (current) drug therapy: Secondary | ICD-10-CM

## 2023-02-23 DIAGNOSIS — T471X6A Underdosing of other antacids and anti-gastric-secretion drugs, initial encounter: Secondary | ICD-10-CM | POA: Diagnosis present

## 2023-02-23 DIAGNOSIS — I9589 Other hypotension: Secondary | ICD-10-CM | POA: Diagnosis present

## 2023-02-23 DIAGNOSIS — A4151 Sepsis due to Escherichia coli [E. coli]: Principal | ICD-10-CM | POA: Diagnosis present

## 2023-02-23 DIAGNOSIS — Z91199 Patient's noncompliance with other medical treatment and regimen due to unspecified reason: Secondary | ICD-10-CM

## 2023-02-23 DIAGNOSIS — E876 Hypokalemia: Secondary | ICD-10-CM | POA: Diagnosis not present

## 2023-02-23 DIAGNOSIS — Z9049 Acquired absence of other specified parts of digestive tract: Secondary | ICD-10-CM

## 2023-02-23 DIAGNOSIS — R001 Bradycardia, unspecified: Secondary | ICD-10-CM | POA: Diagnosis not present

## 2023-02-23 DIAGNOSIS — Z87440 Personal history of urinary (tract) infections: Secondary | ICD-10-CM

## 2023-02-23 DIAGNOSIS — R109 Unspecified abdominal pain: Secondary | ICD-10-CM | POA: Diagnosis not present

## 2023-02-23 DIAGNOSIS — Z885 Allergy status to narcotic agent status: Secondary | ICD-10-CM

## 2023-02-23 DIAGNOSIS — Z888 Allergy status to other drugs, medicaments and biological substances status: Secondary | ICD-10-CM

## 2023-02-23 DIAGNOSIS — R110 Nausea: Secondary | ICD-10-CM | POA: Diagnosis not present

## 2023-02-23 DIAGNOSIS — Z833 Family history of diabetes mellitus: Secondary | ICD-10-CM

## 2023-02-23 DIAGNOSIS — R000 Tachycardia, unspecified: Secondary | ICD-10-CM | POA: Diagnosis not present

## 2023-02-23 DIAGNOSIS — M545 Low back pain, unspecified: Secondary | ICD-10-CM | POA: Diagnosis not present

## 2023-02-23 DIAGNOSIS — N12 Tubulo-interstitial nephritis, not specified as acute or chronic: Secondary | ICD-10-CM | POA: Diagnosis not present

## 2023-02-23 DIAGNOSIS — E2740 Unspecified adrenocortical insufficiency: Secondary | ICD-10-CM | POA: Diagnosis present

## 2023-02-23 DIAGNOSIS — N179 Acute kidney failure, unspecified: Secondary | ICD-10-CM | POA: Diagnosis present

## 2023-02-23 DIAGNOSIS — N261 Atrophy of kidney (terminal): Secondary | ICD-10-CM | POA: Diagnosis present

## 2023-02-23 DIAGNOSIS — R1084 Generalized abdominal pain: Secondary | ICD-10-CM | POA: Diagnosis not present

## 2023-02-23 DIAGNOSIS — N1832 Chronic kidney disease, stage 3b: Secondary | ICD-10-CM | POA: Diagnosis present

## 2023-02-23 DIAGNOSIS — T43226A Underdosing of selective serotonin reuptake inhibitors, initial encounter: Secondary | ICD-10-CM | POA: Diagnosis present

## 2023-02-23 DIAGNOSIS — K219 Gastro-esophageal reflux disease without esophagitis: Secondary | ICD-10-CM | POA: Diagnosis present

## 2023-02-23 DIAGNOSIS — A419 Sepsis, unspecified organism: Secondary | ICD-10-CM | POA: Diagnosis present

## 2023-02-23 DIAGNOSIS — M549 Dorsalgia, unspecified: Secondary | ICD-10-CM | POA: Diagnosis not present

## 2023-02-23 DIAGNOSIS — F5104 Psychophysiologic insomnia: Secondary | ICD-10-CM | POA: Diagnosis present

## 2023-02-23 DIAGNOSIS — F419 Anxiety disorder, unspecified: Secondary | ICD-10-CM | POA: Diagnosis present

## 2023-02-23 DIAGNOSIS — N10 Acute tubulo-interstitial nephritis: Secondary | ICD-10-CM | POA: Diagnosis present

## 2023-02-23 DIAGNOSIS — F32A Depression, unspecified: Secondary | ICD-10-CM | POA: Diagnosis present

## 2023-02-23 LAB — URINALYSIS, W/ REFLEX TO CULTURE (INFECTION SUSPECTED)
Bilirubin Urine: NEGATIVE
Glucose, UA: NEGATIVE mg/dL
Ketones, ur: NEGATIVE mg/dL
Nitrite: POSITIVE — AB
Protein, ur: 100 mg/dL — AB
Specific Gravity, Urine: 1.009 (ref 1.005–1.030)
WBC, UA: >50 WBC/hpf (ref 0–5)
pH: 6 (ref 5.0–8.0)

## 2023-02-23 LAB — CBC WITH DIFFERENTIAL/PLATELET
Abs Immature Granulocytes: 0.01 10*3/uL (ref 0.00–0.07)
Basophils Absolute: 0 10*3/uL (ref 0.0–0.1)
Basophils Relative: 0 %
Eosinophils Absolute: 0.1 10*3/uL (ref 0.0–0.5)
Eosinophils Relative: 1 %
HCT: 42.9 % (ref 36.0–46.0)
Hemoglobin: 14.1 g/dL (ref 12.0–15.0)
Immature Granulocytes: 0 %
Lymphocytes Relative: 8 %
Lymphs Abs: 0.8 10*3/uL (ref 0.7–4.0)
MCH: 31.3 pg (ref 26.0–34.0)
MCHC: 32.9 g/dL (ref 30.0–36.0)
MCV: 95.1 fL (ref 80.0–100.0)
Monocytes Absolute: 0.2 10*3/uL (ref 0.1–1.0)
Monocytes Relative: 2 %
Neutro Abs: 8.3 10*3/uL — ABNORMAL HIGH (ref 1.7–7.7)
Neutrophils Relative %: 89 %
Platelets: 213 10*3/uL (ref 150–400)
RBC: 4.51 MIL/uL (ref 3.87–5.11)
RDW: 12.5 % (ref 11.5–15.5)
WBC: 9.3 10*3/uL (ref 4.0–10.5)
nRBC: 0 % (ref 0.0–0.2)

## 2023-02-23 LAB — COMPREHENSIVE METABOLIC PANEL
ALT: 16 U/L (ref 0–44)
AST: 22 U/L (ref 15–41)
Albumin: 4.4 g/dL (ref 3.5–5.0)
Alkaline Phosphatase: 75 U/L (ref 38–126)
Anion gap: 10 (ref 5–15)
BUN: 11 mg/dL (ref 6–20)
CO2: 24 mmol/L (ref 22–32)
Calcium: 9.1 mg/dL (ref 8.9–10.3)
Chloride: 105 mmol/L (ref 98–111)
Creatinine, Ser: 1.5 mg/dL — ABNORMAL HIGH (ref 0.44–1.00)
GFR, Estimated: 44 mL/min — ABNORMAL LOW (ref >60)
Glucose, Bld: 121 mg/dL — ABNORMAL HIGH (ref 70–99)
Potassium: 3.5 mmol/L (ref 3.5–5.1)
Sodium: 139 mmol/L (ref 135–145)
Total Bilirubin: 0.9 mg/dL (ref 0.3–1.2)
Total Protein: 7.5 g/dL (ref 6.5–8.1)

## 2023-02-23 LAB — I-STAT BETA HCG BLOOD, ED (MC, WL, AP ONLY): I-stat hCG, quantitative: <5 m[IU]/mL (ref <5)

## 2023-02-23 LAB — PROTIME-INR
INR: 1 (ref 0.8–1.2)
Prothrombin Time: 12.9 seconds (ref 11.4–15.2)

## 2023-02-23 LAB — LACTIC ACID, PLASMA: Lactic Acid, Venous: 2.2 mmol/L (ref 0.5–1.9)

## 2023-02-23 LAB — APTT: aPTT: 27 seconds (ref 24–36)

## 2023-02-23 MED ORDER — LACTATED RINGERS IV BOLUS (SEPSIS)
1000.0000 mL | Freq: Once | INTRAVENOUS | Status: AC
  Administered 2023-02-23: 1000 mL via INTRAVENOUS

## 2023-02-23 MED ORDER — LACTATED RINGERS IV BOLUS (SEPSIS): 20000.0000 mL | Freq: Once | INTRAVENOUS | Status: DC

## 2023-02-23 MED ORDER — LACTATED RINGERS IV SOLN: INTRAVENOUS | Status: DC

## 2023-02-23 MED ORDER — MORPHINE SULFATE (PF) 4 MG/ML IV SOLN
4.0000 mg | Freq: Once | INTRAVENOUS | Status: AC
  Administered 2023-02-23: 4 mg via INTRAVENOUS
  Filled 2023-02-23: qty 1

## 2023-02-23 MED ORDER — SODIUM CHLORIDE 0.9 % IV SOLN
2.0000 g | Freq: Once | INTRAVENOUS | Status: AC
  Administered 2023-02-23: 2 g via INTRAVENOUS
  Filled 2023-02-23: qty 20

## 2023-02-23 MED ORDER — ONDANSETRON HCL 4 MG/2ML IJ SOLN
4.0000 mg | Freq: Once | INTRAMUSCULAR | Status: AC
  Administered 2023-02-23: 4 mg via INTRAVENOUS
  Filled 2023-02-23: qty 2

## 2023-02-23 MED ORDER — ACETAMINOPHEN 500 MG PO TABS
1000.0000 mg | ORAL_TABLET | Freq: Once | ORAL | Status: AC
  Administered 2023-02-23: 1000 mg via ORAL
  Filled 2023-02-23: qty 2

## 2023-02-23 NOTE — ED Provider Notes (Signed)
[place] AT [facility] Provider Note   CSN: [id number] Arrival date & time: 02/23/23  2127     History {Add pertinent medical, surgical, social history, OB history to HPI:1} Chief Complaint  Patient presents with   Flank Pain    Heather Moon is a 42 y.o. female.   Flank Pain     Patient presented to the ED for evaluation of fever or back and flank pain.  Patient states symptoms started a few days ago.  She has been having pain in her right back and flank area.  It also is in her lower abdomen.  She started having fevers and had a temperature up to 103.  Patient's had a slight cough but not severe.  Patient's had nausea without vomiting.  She has also had some urinary discomfort and noticed a dark color in her urine.  Patient does have history of prior kidney infection and has a ureteral stent but it is on the left side.  Patient had a cholecystectomy in the past but no appendectomy.  Home Medications Prior to Admission medications   Medication Sig Start Date End Date Taking? Authorizing Provider  FLUoxetine (PROZAC) 10 MG capsule TAKE 1 CAPSULE BY MOUTH EVERY DAY Patient not taking: Reported on 02/23/2023 09/27/22   Nilda Simmer, NP  pantoprazole (PROTONIX) 40 MG tablet TAKE 1 TABLET (40 MG TOTAL) BY MOUTH DAILY. FOR ACID REFLUX Patient not taking: Reported on 09/20/2022 05/31/22   Nilda Simmer, NP  predniSONE (DELTASONE) 20 MG tablet Take 2 tablets (40 mg total) by mouth daily. Patient not taking: Reported on 02/23/2023 11/12/22   Vanessa Kick, MD  valACYclovir (VALTREX) 1000 MG tablet 2 pills now and repeat 2 pills in 12 hours Patient not taking: Reported on 02/23/2023 12/20/22   Kathyrn Drown, MD  zolpidem (AMBIEN) 10 MG tablet TAKE 0.5-1 TABLETS (5-10 MG TOTAL) BY MOUTH AT BEDTIME AS NEEDED. Patient not taking: Reported on 02/23/2023 12/27/22   Nilda Simmer, NP      Allergies    Doxycycline, Hydrocodone, Lexapro  [escitalopram], Macrolides and ketolides, and Trazodone and nefazodone    Review of Systems   Review of Systems  Genitourinary:  Positive for flank pain.    Physical Exam Updated Vital Signs BP 99/68   Pulse 98   Temp 100.3 F (37.9 C) (Oral)   Resp 12   Ht 1.651 m ('5\' 5"'$ )   Wt 74.8 kg   SpO2 96%   BMI 27.46 kg/m  Physical Exam Vitals and nursing note reviewed.  Constitutional:      Appearance: She is well-developed. She is not diaphoretic.  HENT:     Head: Normocephalic and atraumatic.     Right Ear: External ear normal.     Left Ear: External ear normal.  Eyes:     General: No scleral icterus.       Right eye: No discharge.        Left eye: No discharge.     Conjunctiva/sclera: Conjunctivae normal.  Neck:     Trachea: No tracheal deviation.  Cardiovascular:     Rate and Rhythm: Regular rhythm. Tachycardia present.  Pulmonary:     Effort: Pulmonary effort is normal. No respiratory distress.     Breath sounds: Normal breath sounds. No stridor. No wheezing or rales.  Abdominal:     General: Bowel sounds are normal. There is no distension.     Palpations: Abdomen is soft.     Tenderness:  There is no abdominal tenderness. There is no guarding or rebound.  Musculoskeletal:        General: No tenderness or deformity.     Cervical back: Neck supple.  Skin:    General: Skin is warm and dry.     Findings: No rash.  Neurological:     General: No focal deficit present.     Mental Status: She is alert.     Cranial Nerves: No cranial nerve deficit, dysarthria or facial asymmetry.     Sensory: No sensory deficit.     Motor: No abnormal muscle tone or seizure activity.     Coordination: Coordination normal.  Psychiatric:        Mood and Affect: Mood normal.     ED Results / Procedures / Treatments   Labs (all labs ordered are listed, but only abnormal results are displayed) Labs Reviewed  LACTIC ACID, PLASMA - Abnormal; Notable for the following components:       Result Value   Lactic Acid, Venous 2.2 (*)    All other components within normal limits  COMPREHENSIVE METABOLIC PANEL - Abnormal; Notable for the following components:   Glucose, Bld 121 (*)    Creatinine, Ser 1.50 (*)    GFR, Estimated 44 (*)    All other components within normal limits  CBC WITH DIFFERENTIAL/PLATELET - Abnormal; Notable for the following components:   Neutro Abs 8.3 (*)    All other components within normal limits  URINALYSIS, W/ REFLEX TO CULTURE (INFECTION SUSPECTED) - Abnormal; Notable for the following components:   APPearance CLOUDY (*)    Hgb urine dipstick MODERATE (*)    Protein, ur 100 (*)    Nitrite POSITIVE (*)    Leukocytes,Ua LARGE (*)    Bacteria, UA RARE (*)    All other components within normal limits  CULTURE, BLOOD (ROUTINE X 2)  CULTURE, BLOOD (ROUTINE X 2)  RESP PANEL BY RT-PCR (RSV, FLU A&B, COVID)  RVPGX2  URINE CULTURE  PROTIME-INR  APTT  LACTIC ACID, PLASMA  I-STAT BETA HCG BLOOD, ED ([facility], [facility], [facility] ONLY)    EKG EKG Interpretation  Date/Time:  Sunday February 23 2023 22:15:56 EDT Ventricular Rate:  99 PR Interval:  149 QRS Duration: 86 QT Interval:  314 QTC Calculation: 403 R Axis:   46 Text Interpretation: Sinus rhythm Low voltage, extremity and precordial leads Confirmed by Dorie Rank [phone]) on 02/23/2023 10:28:55 PM  Radiology DG Chest Port 1 View  Result Date: 02/23/2023 CLINICAL DATA:  Questionable sepsis - evaluate for abnormality EXAM: PORTABLE CHEST 1 VIEW COMPARISON:  Chest x-ray 08/15/2014 FINDINGS: The heart and mediastinal contours are within normal limits. No focal consolidation. No pulmonary edema. No pleural effusion. No pneumothorax. No acute osseous abnormality. IMPRESSION: No active disease. Electronically Signed   By: Iven Finn M.D.   On: 02/23/2023 22:36    Procedures .Critical Care  Performed by: Dorie Rank, MD Authorized by: Dorie Rank, MD   Critical care provider statement:    Critical care time  (minutes):  45   Critical care was time spent personally by me on the following activities:  Development of treatment plan with patient or surrogate, discussions with consultants, evaluation of patient's response to treatment, examination of patient, ordering and review of laboratory studies, ordering and review of radiographic studies, ordering and performing treatments and interventions, pulse oximetry, re-evaluation of patient's condition and review of old charts   {Document cardiac monitor, telemetry assessment procedure when appropriate:1}  Medications Ordered  in ED Medications  lactated ringers infusion (has no administration in time range)  lactated ringers bolus 1,000 mL (0 mLs Intravenous Stopped 02/23/23 2301)  cefTRIAXone (ROCEPHIN) 2 g in sodium chloride 0.9 % 100 mL IVPB (0 g Intravenous Stopped 02/23/23 2351)  morphine (PF) 4 MG/ML injection 4 mg (4 mg Intravenous Given 02/23/23 2254)  lactated ringers bolus 1,000 mL (0 mLs Intravenous Stopped 02/23/23 2351)  acetaminophen (TYLENOL) tablet 1,000 mg (1,000 mg Oral Given 02/23/23 2300)  ondansetron (ZOFRAN) injection 4 mg (4 mg Intravenous Given 02/23/23 2259)    ED Course/ Medical Decision Making/ A&P Clinical Course as of 02/23/23 2357  Sun Feb 23, 2023  2306 Urinalysis suggestive of urinary tract infection.  Large leukocyte esterase greater than 50 white blood cells rare bacteria but nitrite positive [JK]  2350 CBC normal.  Creatinine elevated at 1.5, decreased from previous value.  Lactic acid level elevated at 2.2. [JK]  2350 Blood pressures remain soft but stable [JK]    Clinical Course User Index [JK] Dorie Rank, MD   {   Click here for ABCD2, HEART and other calculatorsREFRESH Note before signing :1}                          Medical Decision Making Differential diagnosis includes but not limited to pyelonephritis appendicitis, evolving sepsis  Problems Addressed: Pyelonephritis: acute illness or injury that poses a  threat to life or bodily functions  Amount and/or Complexity of Data Reviewed Radiology: ordered and independent interpretation performed. ECG/medicine tests: ordered.  Risk OTC drugs. Prescription drug management.   Patient presents ED for evaluation of abdominal pain fever.  Patient has history of chronic kidney disease and an atrophic left kidney.   Patient has fever up to 103.  Blood pressures have been borderline soft.  Urinalysis is suggestive of a urinary tract infection.  Patient does have a slight lower lactic acid level.  Patient has been treated with IV antibiotics and IV fluids.  With her borderline pressure chronic kidney disease and unilateral kidney she is at risk for complications and decompensation.  I have ordered a CT scan to make sure she does not have any evidence of ureteral obstruction.  I will consult the medical service for admission and further treatment. {Document critical care time when appropriate:1} {Document review of labs and clinical decision tools ie heart score, Chads2Vasc2 etc:1}  {Document your independent review of radiology images, and any outside records:1} {Document your discussion with family members, caretakers, and with consultants:1} {Document social determinants of health affecting pt's care:1} {Document your decision making why or why not admission, treatments were needed:1} Final Clinical Impression(s) / ED Diagnoses Final diagnoses:  Pyelonephritis    Rx / DC Orders ED Discharge Orders     None

## 2023-02-23 NOTE — ED Triage Notes (Signed)
Pt BIB [facility] from home. Reports fever, lower back, R flank, and R lower abd pain for 3 days. Fever started tonight. Tmax 103. Tylenol given en route. Endorses nausea without vomiting. States that urine has been dark and cloudy for the last week and a half. Reports a stent in her L kidney. States that she is often on Cipro for kidney infections.

## 2023-02-24 DIAGNOSIS — N1832 Chronic kidney disease, stage 3b: Secondary | ICD-10-CM | POA: Diagnosis not present

## 2023-02-24 DIAGNOSIS — I9589 Other hypotension: Secondary | ICD-10-CM

## 2023-02-24 DIAGNOSIS — R000 Tachycardia, unspecified: Secondary | ICD-10-CM | POA: Diagnosis not present

## 2023-02-24 DIAGNOSIS — R001 Bradycardia, unspecified: Secondary | ICD-10-CM | POA: Diagnosis not present

## 2023-02-24 DIAGNOSIS — Z90710 Acquired absence of both cervix and uterus: Secondary | ICD-10-CM | POA: Diagnosis not present

## 2023-02-24 DIAGNOSIS — R519 Headache, unspecified: Secondary | ICD-10-CM | POA: Diagnosis not present

## 2023-02-24 DIAGNOSIS — E2740 Unspecified adrenocortical insufficiency: Secondary | ICD-10-CM | POA: Diagnosis not present

## 2023-02-24 DIAGNOSIS — A4151 Sepsis due to Escherichia coli [E. coli]: Secondary | ICD-10-CM | POA: Diagnosis not present

## 2023-02-24 DIAGNOSIS — N179 Acute kidney failure, unspecified: Secondary | ICD-10-CM | POA: Insufficient documentation

## 2023-02-24 DIAGNOSIS — N3000 Acute cystitis without hematuria: Secondary | ICD-10-CM

## 2023-02-24 DIAGNOSIS — F419 Anxiety disorder, unspecified: Secondary | ICD-10-CM | POA: Diagnosis not present

## 2023-02-24 DIAGNOSIS — N10 Acute tubulo-interstitial nephritis: Secondary | ICD-10-CM

## 2023-02-24 DIAGNOSIS — N200 Calculus of kidney: Secondary | ICD-10-CM | POA: Diagnosis not present

## 2023-02-24 DIAGNOSIS — N261 Atrophy of kidney (terminal): Secondary | ICD-10-CM | POA: Diagnosis not present

## 2023-02-24 DIAGNOSIS — R109 Unspecified abdominal pain: Secondary | ICD-10-CM | POA: Diagnosis not present

## 2023-02-24 DIAGNOSIS — Z9049 Acquired absence of other specified parts of digestive tract: Secondary | ICD-10-CM | POA: Diagnosis not present

## 2023-02-24 DIAGNOSIS — A419 Sepsis, unspecified organism: Secondary | ICD-10-CM

## 2023-02-24 DIAGNOSIS — N12 Tubulo-interstitial nephritis, not specified as acute or chronic: Secondary | ICD-10-CM | POA: Diagnosis not present

## 2023-02-24 DIAGNOSIS — R739 Hyperglycemia, unspecified: Secondary | ICD-10-CM | POA: Diagnosis not present

## 2023-02-24 DIAGNOSIS — Z1152 Encounter for screening for COVID-19: Secondary | ICD-10-CM | POA: Diagnosis not present

## 2023-02-24 DIAGNOSIS — R6520 Severe sepsis without septic shock: Secondary | ICD-10-CM | POA: Diagnosis not present

## 2023-02-24 DIAGNOSIS — K5909 Other constipation: Secondary | ICD-10-CM | POA: Diagnosis not present

## 2023-02-24 DIAGNOSIS — F5104 Psychophysiologic insomnia: Secondary | ICD-10-CM | POA: Diagnosis not present

## 2023-02-24 DIAGNOSIS — E876 Hypokalemia: Secondary | ICD-10-CM | POA: Diagnosis not present

## 2023-02-24 DIAGNOSIS — F32A Depression, unspecified: Secondary | ICD-10-CM | POA: Diagnosis not present

## 2023-02-24 DIAGNOSIS — K219 Gastro-esophageal reflux disease without esophagitis: Secondary | ICD-10-CM | POA: Diagnosis not present

## 2023-02-24 DIAGNOSIS — Z833 Family history of diabetes mellitus: Secondary | ICD-10-CM | POA: Diagnosis not present

## 2023-02-24 DIAGNOSIS — Z0389 Encounter for observation for other suspected diseases and conditions ruled out: Secondary | ICD-10-CM | POA: Diagnosis not present

## 2023-02-24 DIAGNOSIS — Z87440 Personal history of urinary (tract) infections: Secondary | ICD-10-CM | POA: Diagnosis not present

## 2023-02-24 DIAGNOSIS — Z91199 Patient's noncompliance with other medical treatment and regimen due to unspecified reason: Secondary | ICD-10-CM | POA: Diagnosis not present

## 2023-02-24 LAB — COMPREHENSIVE METABOLIC PANEL
ALT: 19 U/L (ref 0–44)
AST: 25 U/L (ref 15–41)
Albumin: 2.8 g/dL — ABNORMAL LOW (ref 3.5–5.0)
Alkaline Phosphatase: 49 U/L (ref 38–126)
Anion gap: 7 (ref 5–15)
BUN: 11 mg/dL (ref 6–20)
CO2: 23 mmol/L (ref 22–32)
Calcium: 7.6 mg/dL — ABNORMAL LOW (ref 8.9–10.3)
Chloride: 108 mmol/L (ref 98–111)
Creatinine, Ser: 1.5 mg/dL — ABNORMAL HIGH (ref 0.44–1.00)
GFR, Estimated: 44 mL/min — ABNORMAL LOW (ref >60)
Glucose, Bld: 192 mg/dL — ABNORMAL HIGH (ref 70–99)
Potassium: 3.4 mmol/L — ABNORMAL LOW (ref 3.5–5.1)
Sodium: 138 mmol/L (ref 135–145)
Total Bilirubin: 1 mg/dL (ref 0.3–1.2)
Total Protein: 4.8 g/dL — ABNORMAL LOW (ref 6.5–8.1)

## 2023-02-24 LAB — CORTISOL: Cortisol, Plasma: 4.8 ug/dL

## 2023-02-24 LAB — CBC
HCT: 35.8 % — ABNORMAL LOW (ref 36.0–46.0)
HCT: 33.9 % — ABNORMAL LOW (ref 36.0–46.0)
Hemoglobin: 11.3 g/dL — ABNORMAL LOW (ref 12.0–15.0)
Hemoglobin: 11 g/dL — ABNORMAL LOW (ref 12.0–15.0)
MCH: 31.3 pg (ref 26.0–34.0)
MCH: 31.6 pg (ref 26.0–34.0)
MCHC: 31.6 g/dL (ref 30.0–36.0)
MCHC: 32.4 g/dL (ref 30.0–36.0)
MCV: 99.2 fL (ref 80.0–100.0)
MCV: 97.4 fL (ref 80.0–100.0)
Platelets: 150 10*3/uL (ref 150–400)
Platelets: 137 10*3/uL — ABNORMAL LOW (ref 150–400)
RBC: 3.61 MIL/uL — ABNORMAL LOW (ref 3.87–5.11)
RBC: 3.48 MIL/uL — ABNORMAL LOW (ref 3.87–5.11)
RDW: 12.6 % (ref 11.5–15.5)
RDW: 12.7 % (ref 11.5–15.5)
WBC: 5.8 10*3/uL (ref 4.0–10.5)
WBC: 6.5 10*3/uL (ref 4.0–10.5)
nRBC: 0 % (ref 0.0–0.2)
nRBC: 0 % (ref 0.0–0.2)

## 2023-02-24 LAB — MRSA NEXT GEN BY PCR, NASAL: MRSA by PCR Next Gen: NOT DETECTED

## 2023-02-24 LAB — RESP PANEL BY RT-PCR (RSV, FLU A&B, COVID)  RVPGX2
Influenza A by PCR: NEGATIVE
Influenza B by PCR: NEGATIVE
Resp Syncytial Virus by PCR: NEGATIVE
SARS Coronavirus 2 by RT PCR: NEGATIVE

## 2023-02-24 LAB — CREATININE, SERUM
Creatinine, Ser: 1.21 mg/dL — ABNORMAL HIGH (ref 0.44–1.00)
GFR, Estimated: 57 mL/min — ABNORMAL LOW (ref >60)

## 2023-02-24 LAB — HIV ANTIBODY (ROUTINE TESTING W REFLEX): HIV Screen 4th Generation wRfx: NONREACTIVE

## 2023-02-24 LAB — LACTIC ACID, PLASMA: Lactic Acid, Venous: 1.5 mmol/L (ref 0.5–1.9)

## 2023-02-24 MED ORDER — NICOTINE 14 MG/24HR TD PT24
14.0000 mg | MEDICATED_PATCH | Freq: Every day | TRANSDERMAL | Status: DC
  Administered 2023-02-24 – 2023-02-25 (×2): 14 mg via TRANSDERMAL
  Filled 2023-02-24 (×2): qty 1

## 2023-02-24 MED ORDER — SODIUM CHLORIDE 0.9 % IV SOLN: INTRAVENOUS | Status: DC | PRN

## 2023-02-24 MED ORDER — MORPHINE SULFATE (PF) 2 MG/ML IV SOLN: 2.0000 mg | INTRAVENOUS | Status: DC | PRN

## 2023-02-24 MED ORDER — ORAL CARE MOUTH RINSE: 15.0000 mL | OROMUCOSAL | Status: DC | PRN

## 2023-02-24 MED ORDER — ONDANSETRON HCL 4 MG/2ML IJ SOLN
4.0000 mg | Freq: Four times a day (QID) | INTRAMUSCULAR | Status: DC | PRN
  Administered 2023-02-25 (×3): 4 mg via INTRAVENOUS
  Filled 2023-02-24 (×3): qty 2

## 2023-02-24 MED ORDER — MIDODRINE HCL 5 MG PO TABS
5.0000 mg | ORAL_TABLET | Freq: Once | ORAL | Status: AC
  Administered 2023-02-24: 5 mg via ORAL
  Filled 2023-02-24: qty 1

## 2023-02-24 MED ORDER — POTASSIUM CHLORIDE CRYS ER 20 MEQ PO TBCR
40.0000 meq | EXTENDED_RELEASE_TABLET | Freq: Once | ORAL | Status: AC
  Administered 2023-02-24: 40 meq via ORAL
  Filled 2023-02-24: qty 2

## 2023-02-24 MED ORDER — SODIUM CHLORIDE 0.9 % IV SOLN: 1.0000 g | INTRAVENOUS | Status: DC

## 2023-02-24 MED ORDER — MIDODRINE HCL 5 MG PO TABS
5.0000 mg | ORAL_TABLET | Freq: Three times a day (TID) | ORAL | Status: DC
  Administered 2023-02-24 (×2): 5 mg via ORAL
  Filled 2023-02-24 (×2): qty 1

## 2023-02-24 MED ORDER — LACTATED RINGERS IV BOLUS
500.0000 mL | Freq: Once | INTRAVENOUS | Status: AC
  Administered 2023-02-24: 500 mL via INTRAVENOUS

## 2023-02-24 MED ORDER — ENOXAPARIN SODIUM 40 MG/0.4ML IJ SOSY
40.0000 mg | PREFILLED_SYRINGE | INTRAMUSCULAR | Status: DC
  Administered 2023-02-24 – 2023-02-25 (×2): 40 mg via SUBCUTANEOUS
  Filled 2023-02-24 (×2): qty 0.4

## 2023-02-24 MED ORDER — SODIUM CHLORIDE 0.9 % IV SOLN
1.0000 g | INTRAVENOUS | Status: DC
  Administered 2023-02-24 – 2023-02-25 (×2): 1 g via INTRAVENOUS
  Filled 2023-02-24 (×2): qty 10

## 2023-02-24 MED ORDER — ACETAMINOPHEN 325 MG PO TABS
650.0000 mg | ORAL_TABLET | Freq: Four times a day (QID) | ORAL | Status: DC | PRN
  Administered 2023-02-24 – 2023-02-25 (×5): 650 mg via ORAL
  Filled 2023-02-24 (×5): qty 2

## 2023-02-24 MED ORDER — DEXTROSE-NACL 5-0.9 % IV SOLN: INTRAVENOUS | Status: DC

## 2023-02-24 MED ORDER — CHLORHEXIDINE GLUCONATE CLOTH 2 % EX PADS
6.0000 | MEDICATED_PAD | Freq: Every day | CUTANEOUS | Status: DC
  Administered 2023-02-24 – 2023-02-25 (×2): 6 via TOPICAL

## 2023-02-24 MED ORDER — ACETAMINOPHEN 650 MG RE SUPP: 650.0000 mg | Freq: Four times a day (QID) | RECTAL | Status: DC | PRN

## 2023-02-24 MED ORDER — ONDANSETRON HCL 4 MG PO TABS: 4.0000 mg | ORAL_TABLET | Freq: Four times a day (QID) | ORAL | Status: DC | PRN

## 2023-02-24 MED ORDER — LACTATED RINGERS IV BOLUS (SEPSIS)
1000.0000 mL | Freq: Once | INTRAVENOUS | Status: AC
  Administered 2023-02-24: 1000 mL via INTRAVENOUS

## 2023-02-24 NOTE — Progress Notes (Signed)
       Overnight   NAME: Heather Moon MRN: [id number] DOB : 05/25/1981    Date of Service   02/24/2023   HPI/Events of Note   Notified by RN for continued hypotension.  Patient is awake and oriented x 4 in no obvious or stated distress. Patient states that her "blood pressure runs low most of the time"  Review of old notes yields fluctuations of blood pressure. Patient states that she has been told that she needs to be on midodrine at times in the past.  Patient has started to make urine again after IVF bolus'    Interventions/ Plan   Midodrine ordered Patient changed to SDU Continue monitoring blood pressure and mentation. Consider additional fluid       Gershon Cull BSN MSNA MSN ACNPC-AG Acute Care Nurse Practitioner [place]

## 2023-02-24 NOTE — Consult Note (Signed)
NAME:  Heather Moon, MRN:  [id number], DOB:  09-06-1981, LOS: 0 ADMISSION DATE:  02/23/2023, CONSULTATION DATE:  3/11 REFERRING MD:  Eliseo Squires, CHIEF COMPLAINT:  shock    History of Present Illness:  42 year old female w/ hx as outlined below. Admitted 3/10 w/ cc: several day h/o Nausea, vomiting, Right sided flank pain fever and chills. Eval in ER c/w UTI. CT renal stone showed ill defined hypodensity in the central abd mesentery but no hydro, nephrolithiasis or solid tumor. Was admitted to IM svc working dx sepsis 2/2 UT source. Initial temp 103, HR 120s,lactic acid 2.2. Cultures were sent. Started on IVF resuscitation and empiric abx. From 3/10 to 3/11 had since been transferred to the SDU unit setting. SBP still at times <80. Per pt baseline BP ( 80s).  Lactate down to 1.5, PCCM was asked to evaluate to ensure adequate resuscitation efforts.   Pertinent  Medical History  GERD, UTI (recurrent), migraine head aches, atrophic kidney, chronic constipation. CKD 3b  Significant Hospital Events: Including procedures, antibiotic start and stop dates in addition to other pertinent events   3/10 admitted w/ sepsis 2/2 UT source. Rocephin started.  3/11 PCCM consulted.   Interim History / Subjective:  Back pain better   Objective   Blood pressure (Abnormal) 70/47, pulse 63, temperature 98 F (36.7 C), temperature source Oral, resp. rate 13, height '5\' 4"'$  (1.626 m), weight 74 kg, SpO2 95 %.        Intake/Output Summary (Last 24 hours) at 02/24/2023 0930 Last data filed at 02/24/2023 0745 Gross per 24 hour  Intake 3833.79 ml  Output no documentation  Net 3833.79 ml   Filed Weights   02/23/23 2135 02/24/23 0641  Weight: 74.8 kg 74 kg    Examination: General: 42 yr old female sitting up in bed. She is in no distress HENT: NCAT no JVD  Lungs: clear no accessory use  Cardiovascular: RRR no MRG Abdomen: soft  Extremities: warm, brisk CR. Good pulses Neuro: awake and alert GU:  voids  Resolved Hospital Problem list     Assessment & Plan:  Sepsis 2/2 UT source. Lactate cleared. Cultures still pending.  Plan Cont IVFs Cont abx Await cultures  Acute on chronic hypotension superimposed on CKD stage 3b Was supposed to start midodrine at home but did not. W/ lactate clearing and no decline in renal fxn don't think we need pressors Plan Ck orthostatics Add midodrine tid Ck cortisol  Ckd stage 3b Plan Renal adjust meds SBP goal > 80  Hyperglycemia Plan Trend glucose may need to start insulin  Hypokalemia Plan Replace    ill defined hypodensity in the central abd mesentery  Asymptomatic Plan Monitor  Prob needs f/u imaging as out pt    Best Practice (right click and "Reselect all SmartList Selections" daily)  Per primary   Labs   CBC: Recent Labs  Lab 02/23/23 2159 02/24/23 0414 02/24/23 0744  WBC 9.3 5.8 6.5  NEUTROABS 8.3*  --   --   HGB 14.1 11.3* 11.0*  HCT 42.9 35.8* 33.9*  MCV 95.1 99.2 97.4  PLT 213 150 137*    Basic Metabolic Panel: Recent Labs  Lab 02/23/23 2159 02/24/23 0414 02/24/23 0744  NA 139  --  138  K 3.5  --  3.4*  CL 105  --  108  CO2 24  --  23  GLUCOSE 121*  --  192*  BUN 11  --  11  CREATININE 1.50* 1.21* 1.50*  CALCIUM 9.1  --  7.6*   GFR: Estimated Creatinine Clearance: 47.6 mL/min (A) (by C-G formula based on SCr of 1.5 mg/dL (H)). Recent Labs  Lab 02/23/23 2159 02/24/23 0414 02/24/23 0744  WBC 9.3 5.8 6.5  LATICACIDVEN 2.2*  --  1.5    Liver Function Tests: Recent Labs  Lab 02/23/23 2159 02/24/23 0744  AST 22 25  ALT 16 19  ALKPHOS 75 49  BILITOT 0.9 1.0  PROT 7.5 4.8*  ALBUMIN 4.4 2.8*   No results for input(s): "LIPASE", "AMYLASE" in the last 168 hours. No results for input(s): "AMMONIA" in the last 168 hours.  ABG    Component Value Date/Time   TCO2 24 12/27/2012 2030     Coagulation Profile: Recent Labs  Lab 02/23/23 2159  INR 1.0    Cardiac Enzymes: No  results for input(s): "CKTOTAL", "CKMB", "CKMBINDEX", "TROPONINI" in the last 168 hours.  HbA1C: No results found for: "HGBA1C"  CBG: No results for input(s): "GLUCAP" in the last 168 hours.  Review of Systems:   Review of Systems  Constitutional:  Positive for chills and fever.  HENT: Negative.    Eyes: Negative.   Respiratory: Negative.    Cardiovascular: Negative.   Gastrointestinal: Negative.  Negative for nausea and vomiting.  Genitourinary: Negative.   Musculoskeletal: Negative.   Skin: Negative.   Neurological: Negative.   Endo/Heme/Allergies: Negative.      Past Medical History:  She,  has a past medical history of Blood transfusion without reported diagnosis, Cholelithiasis, Chronic insomnia, Constipation, chronic, GERD (gastroesophageal reflux disease), Kidney infection, Migraine, Migraine headache, Renal disorder, and UTI (lower urinary tract infection).   Surgical History:   Past Surgical History:  Procedure Laterality Date   ABDOMINAL HYSTERECTOMY  08/27/2010   supracervical, blood transfusion   CHOLECYSTECTOMY  01/11/2013   Procedure: LAPAROSCOPIC CHOLECYSTECTOMY;  Surgeon: Harl Bowie, MD;  Location: [facility];  Service: General;  Laterality: N/A;  Laparoscopic cholecystectomy   EYE SURGERY Left    at 34mo old   RENAL ARTERY STENT       Social History:   reports that she has never smoked. She has never used smokeless tobacco. She reports that she does not drink alcohol and does not use drugs.   Family History:  Her family history includes Cancer in her father and maternal grandmother; Cirrhosis in her maternal grandfather; Diabetes in her mother.   Allergies Allergies  Allergen Reactions   Doxycycline Hives   Hydrocodone Nausea And Vomiting   Lexapro [Escitalopram] Nausea Only    Experienced headache and nausea    Macrolides And Ketolides Nausea Only   Trazodone And Nefazodone Other (See Comments)    Headaches     Home  Medications  Prior to Admission medications   Medication Sig Start Date End Date Taking? Authorizing Provider  FLUoxetine (PROZAC) 10 MG capsule TAKE 1 CAPSULE BY MOUTH EVERY DAY Patient not taking: Reported on 02/23/2023 09/27/22   HNilda Simmer NP  pantoprazole (PROTONIX) 40 MG tablet TAKE 1 TABLET (40 MG TOTAL) BY MOUTH DAILY. FOR ACID REFLUX Patient not taking: Reported on 09/20/2022 05/31/22   HNilda Simmer NP  predniSONE (DELTASONE) 20 MG tablet Take 2 tablets (40 mg total) by mouth daily. Patient not taking: Reported on 02/23/2023 11/12/22   HVanessa Kick MD  valACYclovir (VALTREX) 1000 MG tablet 2 pills now and repeat 2 pills in 12 hours Patient not taking: Reported on 02/23/2023 12/20/22   LKathyrn Drown MD  zolpidem (AMBIEN) 10 MG tablet TAKE 0.5-1 TABLETS (5-10 MG TOTAL) BY MOUTH AT BEDTIME AS NEEDED. Patient not taking: Reported on 02/23/2023 12/27/22   Nilda Simmer, NP     Critical care time: 57 min    Erick Colace ACNP-BC [place] Pager # [phone] OR # [phone] if no answer

## 2023-02-24 NOTE — Progress Notes (Addendum)
Patient admitted after midnight, please see H&P.  Here with sepsis due to pyelonephritis.    #1 sepsis secondary to UTI/pyelo:  -has gotten 4L IVF -cultures pending -BP in the 80s-- patient says her normal BP is in the 80s.  No mental status changes.  Will check lactic acid to see if perfusing prior to considering pressors and PCCM consult   #2 GERD:  -not on medications at home   #3 anxiety with depression:  -not on medications at home.   #4 AKI:  -improving with IVF   #5 history of atrophic kidneys: Supportive care only.  Eulogio Bear DO

## 2023-02-24 NOTE — Progress Notes (Signed)
  Transition of Care [facility]) Screening Note   Patient Details  Name: Heather Moon Date of Birth: December 15, 1981   Transition of Care [facility]) CM/SW Contact:    Vassie Moselle, LCSW Phone Number: 02/24/2023, 10:05 AM    Transition of Care Department [facility]) has reviewed patient and no TOC needs have been identified at this time. We will continue to monitor patient advancement through interdisciplinary progression rounds. If new patient transition needs arise, please place a TOC consult.

## 2023-02-24 NOTE — ED Notes (Signed)
Provider aware of patient bp. Pt started on midadrine.

## 2023-02-24 NOTE — H&P (Signed)
History and Physical    Patient: Heather Moon [id number] DOB: [birth year] DOA: 02/23/2023 DOS: the patient was seen and examined on 02/24/2023 PCP: Heather Drown, MD  Patient coming from: Home  Chief Complaint:  Chief Complaint  Patient presents with   Flank Pain   HPI: Heather Moon is a 42 y.o. female with medical history significant of GERD, recurrent UTIs, migraine headaches, atrophic kidney, chronic constipation who presented to the ER with right flank pain fever and chills.  Patient also has had nausea with some mild vomiting.  Symptoms have been going on for a few days.  Patient has tried home regimen but not getting adequate relief.  Came to the ER where she was seen and evaluated.  Patient has significant findings of UTI.  CT renal stone showed ill-defined hypodensity within the central abdominal mesentery.  Otherwise no acute abdomen.  Chest x-ray showed no acute findings.  Patient is positive for nitrite leukocytosis with cloudy urine.  Suspected acute pyelonephritis especially with left CVA tenderness.  Review of Systems: As mentioned in the history of present illness. All other systems reviewed and are negative. 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 She has never used smokeless tobacco. She reports that she does not drink alcohol and does not use drugs.  Allergies  Allergen Reactions   Doxycycline Hives   Hydrocodone Nausea And Vomiting   Lexapro [Escitalopram] Nausea Only    Experienced headache and nausea    Macrolides And Ketolides Nausea Only   Trazodone And Nefazodone Other (See Comments)    Headaches    Family History  Problem Relation Age of Onset   Diabetes Mother    Cancer Father    Cancer Maternal Grandmother    Cirrhosis Maternal Grandfather     Prior to Admission medications   Medication Sig Start Date End Date Taking? Authorizing Provider  FLUoxetine (PROZAC) 10 MG capsule TAKE 1 CAPSULE BY MOUTH EVERY DAY Patient not taking: Reported on 02/23/2023 09/27/22   Heather Simmer NP  pantoprazole (PROTONIX) 40 MG tablet TAKE 1 TABLET (40 MG TOTAL) BY MOUTH DAILY. FOR ACID REFLUX Patient not taking: Reported on 09/20/2022 05/31/22   Heather Simmer NP  predniSONE (DELTASONE) 20 MG tablet Take 2 tablets (40 mg total) by mouth daily. Patient not taking: Reported on 02/23/2023 11/12/22   Heather Kick MD  valACYclovir (VALTREX) 1000 MG tablet 2 pills now and repeat 2 pills in 12 hours Patient not taking: Reported on 02/23/2023 12/20/22   Heather Drown MD  zolpidem (AMBIEN) 10 MG tablet TAKE 0.5-1 TABLETS (5-10 MG TOTAL) BY MOUTH AT BEDTIME AS  NEEDED. Patient not taking: Reported on 02/23/2023 12/27/22   Nilda Simmer, NP    Physical Exam: Vitals:   02/23/23 2230 02/23/23 2245 02/23/23 2300 02/23/23 2316  BP: 1'12/76 95/67 99/68 '$   Pulse: (!) 101 100 98   Resp: '19 10 12   '$ Temp:    100.3 F (37.9 C)  TempSrc:    Oral  SpO2: 98% 97% 96%   Weight:      Height:       Constitutional: Acutely ill looking no distress NAD, calm, comfortable Eyes: PERRL, lids and conjunctivae normal ENMT: Mucous membranes are dry. Posterior pharynx clear of any exudate or  lesions.Normal dentition.  Neck: normal, supple, no masses, no thyromegaly Respiratory: clear to auscultation bilaterally, no wheezing, no crackles. Normal respiratory effort. No accessory muscle use.  Cardiovascular: Sinus tachycardia, no murmurs / rubs / gallops. No extremity edema. 2+ pedal pulses. No carotid bruits.  Abdomen: no tenderness, no masses palpated. No hepatosplenomegaly. Bowel sounds positive.  Musculoskeletal: Good range of motion, no joint swelling or tenderness, Skin: no rashes, lesions, ulcers. No induration Neurologic: CN 2-12 grossly intact. Sensation intact, DTR normal. Strength 5/5 in all 4.  Psychiatric: Normal judgment and insight. Alert and oriented x 3. Normal mood  Data Reviewed:  Temperature 103, heart rate 120, glucose 121, creatinine 1.50.  Lactic acid 2.2 white count 9.3.  Urinalysis essentially positive with large leukocyte positive nitrite many bacteria WBC more than 50.  Acute viral screen is negative.  CT abdomen pelvis showed no acute findings.  Assessment and Plan:  #1 sepsis secondary to UTI: Patient meets sepsis criteria with fever and tachycardia.  Also lactic acid of 2.2.  She also has UTI.  Initiate IV Rocephin.  Aggressive fluid resuscitation.  Will follow sepsis protocol.  Urine and blood cultures obtained.  Adjust antibiotics based on results.  #2 GERD: Continue PPIs  #3 anxiety with depression: Confirm on resume home regimen.  #4 AKI: Hydrate and monitor renal functions.  #5 history of atrophic kidneys: Supportive care only.    Advance Care Planning:   Code Status: Full Code   Consults: None  Family Communication: Boyfriend at bedside  Severity of Illness: The appropriate patient status for this patient is INPATIENT. Inpatient status is judged to be reasonable and necessary in order to provide the required intensity of service to ensure the patient's safety. The patient's presenting symptoms, physical exam findings, and initial  radiographic and laboratory data in the context of their chronic comorbidities is felt to place them at high risk for further clinical deterioration. Furthermore, it is not anticipated that the patient will be medically stable for discharge from the hospital within 2 midnights of admission.   * I certify that at the point of admission it is my clinical judgment that the patient will require inpatient hospital care spanning beyond 2 midnights from the point of admission due to high intensity of service, high risk for further deterioration and high frequency of surveillance required.*  AuthorBarbette Merino, MD 02/24/2023 12:31 AM  For on call review [email or web address].

## 2023-02-25 ENCOUNTER — Encounter (HOSPITAL_COMMUNITY): Payer: Self-pay | Admitting: Internal Medicine

## 2023-02-25 ENCOUNTER — Other Ambulatory Visit: Payer: Self-pay

## 2023-02-25 DIAGNOSIS — A419 Sepsis, unspecified organism: Secondary | ICD-10-CM | POA: Diagnosis not present

## 2023-02-25 DIAGNOSIS — N1832 Chronic kidney disease, stage 3b: Secondary | ICD-10-CM | POA: Diagnosis not present

## 2023-02-25 DIAGNOSIS — E2740 Unspecified adrenocortical insufficiency: Secondary | ICD-10-CM | POA: Diagnosis not present

## 2023-02-25 DIAGNOSIS — N10 Acute tubulo-interstitial nephritis: Secondary | ICD-10-CM | POA: Diagnosis not present

## 2023-02-25 DIAGNOSIS — F419 Anxiety disorder, unspecified: Secondary | ICD-10-CM | POA: Diagnosis not present

## 2023-02-25 DIAGNOSIS — F32A Depression, unspecified: Secondary | ICD-10-CM | POA: Diagnosis not present

## 2023-02-25 DIAGNOSIS — K219 Gastro-esophageal reflux disease without esophagitis: Secondary | ICD-10-CM | POA: Diagnosis not present

## 2023-02-25 DIAGNOSIS — A4151 Sepsis due to Escherichia coli [E. coli]: Secondary | ICD-10-CM | POA: Diagnosis not present

## 2023-02-25 DIAGNOSIS — I9589 Other hypotension: Secondary | ICD-10-CM | POA: Diagnosis not present

## 2023-02-25 DIAGNOSIS — R6520 Severe sepsis without septic shock: Secondary | ICD-10-CM | POA: Diagnosis not present

## 2023-02-25 DIAGNOSIS — Z1152 Encounter for screening for COVID-19: Secondary | ICD-10-CM | POA: Diagnosis not present

## 2023-02-25 DIAGNOSIS — N12 Tubulo-interstitial nephritis, not specified as acute or chronic: Secondary | ICD-10-CM | POA: Diagnosis not present

## 2023-02-25 DIAGNOSIS — E876 Hypokalemia: Secondary | ICD-10-CM | POA: Diagnosis not present

## 2023-02-25 DIAGNOSIS — N179 Acute kidney failure, unspecified: Secondary | ICD-10-CM | POA: Diagnosis not present

## 2023-02-25 LAB — CBC
HCT: 35.6 % — ABNORMAL LOW (ref 36.0–46.0)
Hemoglobin: 11.4 g/dL — ABNORMAL LOW (ref 12.0–15.0)
MCH: 31.1 pg (ref 26.0–34.0)
MCHC: 32 g/dL (ref 30.0–36.0)
MCV: 97 fL (ref 80.0–100.0)
Platelets: 159 10*3/uL (ref 150–400)
RBC: 3.67 MIL/uL — ABNORMAL LOW (ref 3.87–5.11)
RDW: 12.8 % (ref 11.5–15.5)
WBC: 7.9 10*3/uL (ref 4.0–10.5)
nRBC: 0 % (ref 0.0–0.2)

## 2023-02-25 LAB — BASIC METABOLIC PANEL
Anion gap: 8 (ref 5–15)
BUN: 12 mg/dL (ref 6–20)
CO2: 24 mmol/L (ref 22–32)
Calcium: 8.5 mg/dL — ABNORMAL LOW (ref 8.9–10.3)
Chloride: 108 mmol/L (ref 98–111)
Creatinine, Ser: 1.47 mg/dL — ABNORMAL HIGH (ref 0.44–1.00)
GFR, Estimated: 45 mL/min — ABNORMAL LOW (ref >60)
Glucose, Bld: 107 mg/dL — ABNORMAL HIGH (ref 70–99)
Potassium: 4.5 mmol/L (ref 3.5–5.1)
Sodium: 140 mmol/L (ref 135–145)

## 2023-02-25 LAB — TSH: TSH: 3.241 u[IU]/mL (ref 0.350–4.500)

## 2023-02-25 MED ORDER — BUTALBITAL-APAP-CAFFEINE 50-325-40 MG PO TABS
1.0000 | ORAL_TABLET | ORAL | Status: DC | PRN
  Administered 2023-02-25 (×3): 1 via ORAL
  Filled 2023-02-25 (×3): qty 1

## 2023-02-25 MED ORDER — DIPHENHYDRAMINE HCL 25 MG PO CAPS
25.0000 mg | ORAL_CAPSULE | Freq: Once | ORAL | Status: AC | PRN
  Administered 2023-02-25: 25 mg via ORAL
  Filled 2023-02-25: qty 1

## 2023-02-25 MED ORDER — LACTATED RINGERS IV SOLN: INTRAVENOUS | Status: AC

## 2023-02-25 MED ORDER — INFLUENZA VAC SPLIT QUAD 0.5 ML IM SUSY: 0.5000 mL | PREFILLED_SYRINGE | INTRAMUSCULAR | Status: DC

## 2023-02-25 NOTE — Progress Notes (Signed)
PROGRESS NOTE    Adriela Moon  [id number] DOB: 08/27/1981 DOA: 02/23/2023 PCP: Kathyrn Drown, MD    Brief Narrative:  Heather Moon is a 42 y.o. female with medical history significant of GERD, recurrent UTIs, migraine headaches, atrophic kidney, chronic constipation who presented to the ER with right flank pain fever and chills.  Patient also has had nausea with some mild vomiting.  Symptoms have been going on for a few days.  Patient has tried home regimen but not getting adequate relief.  Came to the ER where she was seen and evaluated.  Patient has significant findings of UTI.  CT renal stone showed ill-defined hypodensity within the central abdominal mesentery.  Otherwise no acute abdomen.    Assessment and Plan:  sepsis secondary to UTI/pyelo:  -cultures pending -BP improved -trial of midodrine but appears to have caused bradycardia  -? Adrenal insufficiency-- will need endocrine follow up outpatient for further testing  GERD:  -not on medications at home   Hypokalemia -repleted  anxiety with depression:  -not on medications at home.   AKI on CKD stage IIIa -stable   Headache -given Fioricet  history of atrophic kidneys: Supportive care only.  CT renal stone showed ill-defined hypodensity within the central abdominal mesentery.  -outpatient follow up  DVT prophylaxis: enoxaparin (LOVENOX) injection 40 mg Start: 02/24/23 1000    Code Status: Full Code Family Communication:   Disposition Plan:  Level of care: Telemetry Status is: Inpatient Remains inpatient appropriate because: needs cultures and abx    Consultants:  pccm   Subjective: Normally drinks a lot of cheer wine and now has a headache this AM  Objective: Vitals:   02/25/23 0500 02/25/23 0600 02/25/23 0700 02/25/23 0806  BP: 112/61 94/75 119/86   Pulse: (!) 49 (!) 50 73   Resp: '12 11 17   '$ Temp:    98.2 F (36.8 C)  TempSrc:    Oral  SpO2: 92% 94% 99%   Weight:      Height:         Intake/Output Summary (Last 24 hours) at 02/25/2023 1010 Last data filed at 02/25/2023 0725 Gross per 24 hour  Intake 3436.54 ml  Output 2175 ml  Net 1261.54 ml   Filed Weights   02/23/23 2135 02/24/23 0641  Weight: 74.8 kg 74 kg    Examination:   General: Appearance:     Overweight female in no acute distress     Lungs:     respirations unlabored  Heart:    Normal heart rate.   MS:   All extremities are intact.    Neurologic:   Awake, alert       Data Reviewed: I have personally reviewed following labs and imaging studies  CBC: Recent Labs  Lab 02/23/23 2159 02/24/23 0414 02/24/23 0744 02/25/23 0858  WBC 9.3 5.8 6.5 7.9  NEUTROABS 8.3*  --   --   --   HGB 14.1 11.3* 11.0* 11.4*  HCT 42.9 35.8* 33.9* 35.6*  MCV 95.1 99.2 97.4 97.0  PLT 213 150 137* [id number]   Basic Metabolic Panel: Recent Labs  Lab 02/23/23 2159 02/24/23 0414 02/24/23 0744 02/25/23 0858  NA 139  --  138 140  K 3.5  --  3.4* 4.5  CL 105  --  108 108  CO2 24  --  23 24  GLUCOSE 121*  --  192* 107*  BUN 11  --  11 12  CREATININE 1.50* 1.21* 1.50* 1.47*  CALCIUM 9.1  --  7.6* 8.5*   GFR: Estimated Creatinine Clearance: 48.6 mL/min (A) (by C-G formula based on SCr of 1.47 mg/dL (H)). Liver Function Tests: Recent Labs  Lab 02/23/23 2159 02/24/23 0744  AST 22 25  ALT 16 19  ALKPHOS 75 49  BILITOT 0.9 1.0  PROT 7.5 4.8*  ALBUMIN 4.4 2.8*   No results for input(s): "LIPASE", "AMYLASE" in the last 168 hours. No results for input(s): "AMMONIA" in the last 168 hours. Coagulation Profile: Recent Labs  Lab 02/23/23 2159  INR 1.0   Cardiac Enzymes: No results for input(s): "CKTOTAL", "CKMB", "CKMBINDEX", "TROPONINI" in the last 168 hours. BNP (last 3 results) No results for input(s): "PROBNP" in the last 8760 hours. HbA1C: No results for input(s): "HGBA1C" in the last 72 hours. CBG: No results for input(s): "GLUCAP" in the last 168 hours. Lipid Profile: No results for  input(s): "CHOL", "HDL", "LDLCALC", "TRIG", "CHOLHDL", "LDLDIRECT" in the last 72 hours. Thyroid Function Tests: Recent Labs    02/25/23 0858  TSH 3.241   Anemia Panel: No results for input(s): "VITAMINB12", "FOLATE", "FERRITIN", "TIBC", "IRON", "RETICCTPCT" in the last 72 hours. Sepsis Labs: Recent Labs  Lab 02/23/23 2159 02/24/23 0744  LATICACIDVEN 2.2* 1.5    Recent Results (from the past 240 hour(s))  Blood Culture (routine x 2)     Status: None (Preliminary result)   Collection Time: 02/23/23  9:30 PM   Specimen: BLOOD  Result Value Ref Range Status   Specimen Description   Final    BLOOD LEFT ANTECUBITAL Performed at [facility] [address] [place], [place] [address]    Special Requests   Final    BOTTLES DRAWN AEROBIC AND ANAEROBIC Blood Culture results may not be optimal due to an excessive volume of blood received in culture bottles Performed at [facility] [address], [place], [place] [address]    Culture   Final    NO GROWTH 1 DAY Performed at [facility], [place] [address], [place], [place] [address]    Report Status PENDING  Incomplete  Blood Culture (routine x 2)     Status: None (Preliminary result)   Collection Time: 02/23/23  9:59 PM   Specimen: BLOOD  Result Value Ref Range Status   Specimen Description   Final    BLOOD RIGHT ANTECUBITAL Performed at [facility] [address], [place], [place] [address]    Special Requests   Final    BOTTLES DRAWN AEROBIC AND ANAEROBIC Blood Culture adequate volume Performed at [facility] [address] [place], [place] [address]    Culture   Final    NO GROWTH 1 DAY Performed at [facility], [place] [address] [place], [place] [address]    Report Status PENDING  Incomplete  Urine Culture     Status: Abnormal (Preliminary result)   Collection Time: 02/23/23  9:59 PM   Specimen: Urine, Random  Result Value Ref  Range Status   Specimen Description   Final    URINE, RANDOM Performed at [facility] [address], [place], [place] [address]    Special Requests   Final    NONE Reflexed from [phone] Performed at [facility], [place] [address] [place], [place] [address]    Culture (A)  Final    >=100,000 COLONIES/mL GRAM NEGATIVE RODS SUSCEPTIBILITIES TO FOLLOW Performed at [facility], [place] [address], [place],  [address]  Report Status PENDING  Incomplete  Resp panel by RT-PCR (RSV, Flu A&B, Covid) Anterior Nasal Swab     Status: None   Collection Time: 02/23/23 11:09 PM   Specimen: Anterior Nasal Swab  Result Value Ref Range Status   SARS Coronavirus 2 by RT PCR NEGATIVE NEGATIVE Final    Comment: (NOTE) SARS-CoV-2 target nucleic acids are NOT DETECTED.  The SARS-CoV-2 RNA is generally detectable in upper respiratory specimens during the acute phase of infection. The lowest concentration of SARS-CoV-2 viral copies this assay can detect is 138 copies/mL. A negative result does not preclude SARS-Cov-2 infection and should not be used as the sole basis for treatment or other patient management decisions. A negative result may occur with  improper specimen collection/handling, submission of specimen other than nasopharyngeal swab, presence of viral mutation(s) within the areas targeted by this assay, and inadequate number of viral copies(<138 copies/mL). A negative result must be combined with clinical observations, patient history, and epidemiological information. The expected result is Negative.  Fact Sheet for Patients:  EntrepreneurPulse.com.au  Fact Sheet for Healthcare Providers:  IncredibleEmployment.be  This test is no t yet approved or cleared by the [place] FDA and  has been authorized for detection and/or diagnosis of SARS-CoV-2 by FDA under an Emergency Use Authorization (EUA). This  EUA will remain  in effect (meaning this test can be used) for the duration of the COVID-19 declaration under Section 564(b)(1) of the Act, 21 U.S.C.section 360bbb-3(b)(1), unless the authorization is terminated  or revoked sooner.       Influenza A by PCR NEGATIVE NEGATIVE Final   Influenza B by PCR NEGATIVE NEGATIVE Final    Comment: (NOTE) The Xpert Xpress SARS-CoV-2/FLU/RSV plus assay is intended as an aid in the diagnosis of influenza from Nasopharyngeal swab specimens and should not be used as a sole basis for treatment. Nasal washings and aspirates are unacceptable for Xpert Xpress SARS-CoV-2/FLU/RSV testing.  Fact Sheet for Patients: EntrepreneurPulse.com.au  Fact Sheet for Healthcare Providers: IncredibleEmployment.be  This test is not yet approved or cleared by the [place] FDA and has been authorized for detection and/or diagnosis of SARS-CoV-2 by FDA under an Emergency Use Authorization (EUA). This EUA will remain in effect (meaning this test can be used) for the duration of the COVID-19 declaration under Section 564(b)(1) of the Act, 21 U.S.C. section 360bbb-3(b)(1), unless the authorization is terminated or revoked.     Resp Syncytial Virus by PCR NEGATIVE NEGATIVE Final    Comment: (NOTE) Fact Sheet for Patients: EntrepreneurPulse.com.au  Fact Sheet for Healthcare Providers: IncredibleEmployment.be  This test is not yet approved or cleared by the [place] FDA and has been authorized for detection and/or diagnosis of SARS-CoV-2 by FDA under an Emergency Use Authorization (EUA). This EUA will remain in effect (meaning this test can be used) for the duration of the COVID-19 declaration under Section 564(b)(1) of the Act, 21 U.S.C. section 360bbb-3(b)(1), unless the authorization is terminated or revoked.  Performed at [facility], [place] [address] [place], [place] [address]   MRSA Next Gen by PCR, Nasal     Status: None   Collection Time: 02/24/23  5:24 AM   Specimen: Nasal Mucosa; Nasal Swab  Result Value Ref Range Status   MRSA by PCR Next Gen NOT DETECTED NOT DETECTED Final    Comment: (NOTE) The GeneXpert MRSA Assay (FDA approved for NASAL specimens only), is one component of a comprehensive MRSA colonization surveillance program. It is not intended  to diagnose MRSA infection nor to guide or monitor treatment for MRSA infections. Test performance is not FDA approved in patients less than 52 years old. Performed at [facility], [place] [address], [place], [place] [address]          Radiology Studies: CT Renal Stone Study  Result Date: 02/24/2023 CLINICAL DATA:  Flank pain EXAM: CT ABDOMEN AND PELVIS WITHOUT CONTRAST TECHNIQUE: Multidetector CT imaging of the abdomen and pelvis was performed following the standard protocol without IV contrast. RADIATION DOSE REDUCTION: This exam was performed according to the departmental dose-optimization program which includes automated exposure control, adjustment of the mA and/or kV according to patient size and/or use of iterative reconstruction technique. COMPARISON:  None Available. FINDINGS: Lower Chest: Normal. Hepatobiliary: Normal hepatic contours. No intra- or extrahepatic biliary dilatation. Status post cholecystectomy. 13 mm right hepatic lobe cyst. Pancreas: Normal pancreas. No ductal dilatation or peripancreatic fluid collection. Spleen: Normal. Adrenals/Urinary Tract: The adrenal glands are normal. No hydronephrosis, nephroureterolithiasis or solid renal mass. The urinary bladder is normal for degree of distention Stomach/Bowel: There is no hiatal hernia. Normal duodenal course and caliber. No small bowel dilatation or inflammation. No focal colonic abnormality. Normal appendix. Vascular/Lymphatic: Normal course and caliber of the major abdominal vessels. There are  clustered subcentimeter lymph nodes in the central abdominal mesentery. Reproductive: Normal uterus. No adnexal mass. Other: There is ill-defined hyperdensity within the central abdominal mesentery. Musculoskeletal: No bony spinal canal stenosis or focal osseous abnormality. IMPRESSION: 1. No acute abnormality of the abdomen or pelvis. 2. Ill-defined hyperdensity within the central abdominal mesentery with clustered subcentimeter lymph nodes. 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 Electronically Signed   By: Ulyses Jarred M.D.   On: 02/24/2023 00:07   DG Chest Port 1 View  Result Date: 02/23/2023 CLINICAL DATA:  Questionable sepsis - evaluate for abnormality EXAM: PORTABLE CHEST 1 VIEW COMPARISON:  Chest x-ray 08/15/2014 FINDINGS: The heart and mediastinal contours are within normal limits. No focal consolidation. No pulmonary edema. No pleural effusion. No pneumothorax. No acute osseous abnormality. IMPRESSION: No active disease. Electronically Signed   By: Iven Finn M.D.   On: 02/23/2023 22:36        Scheduled Meds:  Chlorhexidine Gluconate Cloth  6 each Topical Daily   enoxaparin (LOVENOX) injection  40 mg Subcutaneous Q24H   nicotine  14 mg Transdermal Daily   Continuous Infusions:  sodium chloride Stopped (02/24/23 2248)   cefTRIAXone (ROCEPHIN)  IV Stopped (02/24/23 2229)   lactated ringers       LOS: 1 day    Time spent: 45 minutes spent on chart review, discussion with nursing staff, consultants, updating family and interview/physical exam; more than 50% of that time was spent in counseling and/or coordination of care.    Geradine Girt, DO [facility] Available via Epic secure chat 7am-7pm After these hours, please refer to coverage provider listed on [email or web address] 02/25/2023, 10:10 AM

## 2023-02-25 NOTE — Final Progress Note (Signed)
   NAME:  Capria Cartaya, MRN:  [id number], DOB:  1981-04-09, LOS: 1 ADMISSION DATE:  02/23/2023, CONSULTATION DATE:  3/11 REFERRING MD:  Eliseo Squires, CHIEF COMPLAINT:  shock    History of Present Illness:  42 year old female w/ hx as outlined below. Admitted 3/10 w/ cc: several day h/o Nausea, vomiting, Right sided flank pain fever and chills. Eval in ER c/w UTI. CT renal stone showed ill defined hypodensity in the central abd mesentery but no hydro, nephrolithiasis or solid tumor. Was admitted to IM svc working dx sepsis 2/2 UT source. Initial temp 103, HR 120s,lactic acid 2.2. Cultures were sent. Started on IVF resuscitation and empiric abx. From 3/10 to 3/11 had since been transferred to the SDU unit setting. SBP still at times <80. Per pt baseline BP ( 80s).  Lactate down to 1.5, PCCM was asked to evaluate to ensure adequate resuscitation efforts.   Pertinent  Medical History  GERD, UTI (recurrent), migraine head aches, atrophic kidney, chronic constipation. CKD 3b  Significant Hospital Events: Including procedures, antibiotic start and stop dates in addition to other pertinent events   3/10 admitted w/ sepsis 2/2 UT source. Rocephin started.  3/11 PCCM consulted. Got bradycardic w/ midodrine so stopped  Interim History / Subjective:  Back pain better but has headache. Says similar to ones she gets at home   Objective   Blood pressure 119/86, pulse 73, temperature 98.1 F (36.7 C), temperature source Oral, resp. rate 17, height 5\' 4"  (1.626 m), weight 74 kg, SpO2 99 %.        Intake/Output Summary (Last 24 hours) at 02/25/2023 0804 Last data filed at 02/25/2023 0725 Gross per 24 hour  Intake 3885.66 ml  Output 2175 ml  Net 1710.66 ml    Filed Weights   02/23/23 2135 02/24/23 0641  Weight: 74.8 kg 74 kg    Examination: General 42 year old female resting in chair no distress HENT NCAT no JVD  Pulm clear Card RRR (brady) no MRG Abd soft Ext warm dry  Neuro intact   Resolved  Hospital Problem list   Acute on chronic hypotension superimposed on CKD stage 3b  Assessment & Plan:  Sepsis 2/2 UT source. Lactate cleared. 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  -asymptomatic  Plan Midodrine listed as allergy  Ck 12 lead Ck TSH  Fluid and electrolyte imbalance Plan Follow up and replace PRN  Hyperglycemia Plan Trend glucose may need to start insulin  Headache (h/o migraines. States similar to baseline HA) Plan Fioricet    ill defined hypodensity in the central abd mesentery  Asymptomatic Plan Monitor  Prob needs f/u imaging as out pt    Best Practice (right click and "Reselect all SmartList Selections" daily)  Per primary      Critical care time:   Erick Colace ACNP-BC [place] Pager # [phone] OR # [phone] if no answer

## 2023-02-26 DIAGNOSIS — A4151 Sepsis due to Escherichia coli [E. coli]: Principal | ICD-10-CM

## 2023-02-26 LAB — URINE CULTURE: Culture: >=100000 — AB

## 2023-02-26 MED ORDER — CIPROFLOXACIN HCL 500 MG PO TABS: 500.0000 mg | ORAL_TABLET | Freq: Two times a day (BID) | ORAL | 0 refills | Status: AC

## 2023-02-26 NOTE — Plan of Care (Signed)

## 2023-02-26 NOTE — Discharge Summary (Signed)
Physician Discharge Summary  Heather Moon [id number] DOB: 27-Mar-1981 DOA: 02/23/2023  PCP: Kathyrn Drown, MD  Admit date: 02/23/2023 Discharge date: 02/26/2023  Admitted From: Home Disposition:    Recommendations for Outpatient Follow-up:  Follow up with PCP in 1-2 weeks Please obtain BMP/CBC in one week your next doctors visit.  Oral Cipro given for 7 more days to complete 10-day course  Home Health: Equipment/Devices: Discharge Condition: Stable CODE STATUS:  Diet recommendation:   Brief/Interim Summary: 42 year old with history of GERD, UTI, migraines, atrophic kidney, chronic constipation, CKD stage IIIb comes to the hospital on 3/10 with nausea, vomiting and right flank pain. She was found to be septic secondary to urinary tract infection/pyelonephritis and started on empiric IV antibiotics and IV fluids. CT renal showed ill-defined hypodensity with central abdominal mesentery. Due to hypotension, PCCM was consulted. Midodrine was started but patient became bradycardic therefore it was discontinued.  Patient doing significantly well today and wants to go.  Reviewed culture data and will discharge her home on Cipro.   Sepsis secondary to E. coli urinary tract infection/pyelonephritis Sepsis physiology has improved.  Trial of midodrine because of bradycardia therefore discontinued.  Culture data reviewed along with sensitivities, IV Rocephin.  Will transition patient over to Cipro for 7 more days to complete total 10-day course.  Advised to follow-up outpatient provider.   GERD:  Supposed to be on PPI but noncompliant   Hypokalemia Replete as necessary   Anxiety with depression Previously prescribed Prozac, not taking this   CKD stage IIIa History of atrophic kidneys Patient does not have AKI, creatinine around baseline of 1.3-1.5.   Headache -given Fioricet     CT renal stone showed ill-defined hypodensity within the central abdominal mesentery.  -outpatient  follow up       Discharge Diagnoses:  Principal Problem:   Sepsis ([facility]) Active Problems:   Anxiety and depression   Atrophic kidney   Gastroesophageal reflux disease without esophagitis   Acute pyelonephritis   Sepsis secondary to UTI [facility])      Consultations: PCCM  Subjective: Doing well wants to go home.  Feels back to her baseline.  Discharge Exam: Vitals:   02/26/23 0723 02/26/23 0830  BP:  134/83  Pulse:  63  Resp:  15  Temp: 98.4 F (36.9 C)   SpO2:  100%   Vitals:   02/26/23 0000 02/26/23 0400 02/26/23 0723 02/26/23 0830  BP: 104/81 121/76  134/83  Pulse: 63 (!) 53  63  Resp: '16 15  15  '$ Temp: 98 F (36.7 C) 98 F (36.7 C) 98.4 F (36.9 C)   TempSrc: Oral Oral Oral   SpO2: 91% 96%  100%  Weight:      Height:        General: Pt is alert, awake, not in acute distress Cardiovascular: RRR, S1/S2 +, no rubs, no gallops Respiratory: CTA bilaterally, no wheezing, no rhonchi Abdominal: Soft, NT, ND, bowel sounds + Extremities: no edema, no cyanosis  Discharge Instructions   Allergies as of 02/26/2023       Reactions   Midodrine Other (See Comments)   Severe bradycardia    Doxycycline Hives   Hydrocodone Nausea And Vomiting   Lexapro [escitalopram] Nausea Only   Experienced headache and nausea    Macrolides And Ketolides Nausea Only   Trazodone And Nefazodone Other (See Comments)   Headaches        Medication List     STOP taking these medications    predniSONE 20  MG tablet Commonly known as: DELTASONE   valACYclovir 1000 MG tablet Commonly known as: VALTREX   zolpidem 10 MG tablet Commonly known as: AMBIEN       TAKE these medications    ciprofloxacin 500 MG tablet Commonly known as: Cipro Take 1 tablet (500 mg total) by mouth 2 (two) times daily for 7 days.   FLUoxetine 10 MG capsule Commonly known as: PROZAC TAKE 1 CAPSULE BY MOUTH EVERY DAY   pantoprazole 40 MG tablet Commonly known as: PROTONIX TAKE 1 TABLET (40  MG TOTAL) BY MOUTH DAILY. FOR ACID REFLUX        Follow-up Information     Kathyrn Drown, MD Follow up in 1 week(s).   Specialty: Family Medicine Contact information: [place] [address] [phone]                Allergies  Allergen Reactions   Midodrine Other (See Comments)    Severe bradycardia    Doxycycline Hives   Hydrocodone Nausea And Vomiting   Lexapro [Escitalopram] Nausea Only    Experienced headache and nausea    Macrolides And Ketolides Nausea Only   Trazodone And Nefazodone Other (See Comments)    Headaches    You were cared for by a hospitalist during your hospital stay. If you have any questions about your discharge medications or the care you received while you were in the hospital after you are discharged, you can call the unit and asked to speak with the hospitalist on call if the hospitalist that took care of you is not available. Once you are discharged, your primary care physician will handle any further medical issues. Please note that no refills for any discharge medications will be authorized once you are discharged, as it is imperative that you return to your primary care physician (or establish a relationship with a primary care physician if you do not have one) for your aftercare needs so that they can reassess your need for medications and monitor your lab values.  You were cared for by a hospitalist during your hospital stay. If you have any questions about your discharge medications or the care you received while you were in the hospital after you are discharged, you can call the unit and asked to speak with the hospitalist on call if the hospitalist that took care of you is not available. Once you are discharged, your primary care physician will handle any further medical issues. Please note that NO REFILLS for any discharge medications will be authorized once you are discharged, as it is imperative that you return to  your primary care physician (or establish a relationship with a primary care physician if you do not have one) for your aftercare needs so that they can reassess your need for medications and monitor your lab values.  Please request your Prim.MD to go over all Hospital Tests and Procedure/Radiological results at the follow up, please get all Hospital records sent to your Prim MD by signing hospital release before you go home.  Get CBC, CMP, 2 view Chest X ray checked  by Primary MD during your next visit or SNF MD in 5-7 days ( we routinely change or add medications that can affect your baseline labs and fluid status, therefore we recommend that you get the mentioned basic workup next visit with your PCP, your PCP may decide not to get them or add new tests based on their clinical decision)  On your next  visit with your primary care physician please Get Medicines reviewed and adjusted.  If you experience worsening of your admission symptoms, develop shortness of breath, life threatening emergency, suicidal or homicidal thoughts you must seek medical attention immediately by calling 911 or calling your MD immediately  if symptoms less severe.  You Must read complete instructions/literature along with all the possible adverse reactions/side effects for all the Medicines you take and that have been prescribed to you. Take any new Medicines after you have completely understood and accpet all the possible adverse reactions/side effects.   Do not drive, operate heavy machinery, perform activities at heights, swimming or participation in water activities or provide baby sitting services if your were admitted for syncope or siezures until you have seen by Primary MD or a Neurologist and advised to do so again.  Do not drive when taking Pain medications.   Procedures/Studies: CT Renal Stone Study  Result Date: 02/24/2023 CLINICAL DATA:  Flank pain EXAM: CT ABDOMEN AND PELVIS WITHOUT CONTRAST TECHNIQUE:  Multidetector CT imaging of the abdomen and pelvis was performed following the standard protocol without IV contrast. RADIATION DOSE REDUCTION: This exam was performed according to the departmental dose-optimization program which includes automated exposure control, adjustment of the mA and/or kV according to patient size and/or use of iterative reconstruction technique. COMPARISON:  None Available. FINDINGS: Lower Chest: Normal. Hepatobiliary: Normal hepatic contours. No intra- or extrahepatic biliary dilatation. Status post cholecystectomy. 13 mm right hepatic lobe cyst. Pancreas: Normal pancreas. No ductal dilatation or peripancreatic fluid collection. Spleen: Normal. Adrenals/Urinary Tract: The adrenal glands are normal. No hydronephrosis, nephroureterolithiasis or solid renal mass. The urinary bladder is normal for degree of distention Stomach/Bowel: There is no hiatal hernia. Normal duodenal course and caliber. No small bowel dilatation or inflammation. No focal colonic abnormality. Normal appendix. Vascular/Lymphatic: Normal course and caliber of the major abdominal vessels. There are clustered subcentimeter lymph nodes in the central abdominal mesentery. Reproductive: Normal uterus. No adnexal mass. Other: There is ill-defined hyperdensity within the central abdominal mesentery. Musculoskeletal: No bony spinal canal stenosis or focal osseous abnormality. IMPRESSION: 1. No acute abnormality of the abdomen or pelvis. 2. Ill-defined hyperdensity within the central abdominal mesentery with clustered subcentimeter lymph nodes. 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 Electronically Signed   By: Ulyses Jarred M.D.   On: 02/24/2023 00:07   DG Chest Port 1 View  Result Date: 02/23/2023 CLINICAL DATA:  Questionable sepsis - evaluate for abnormality EXAM: PORTABLE CHEST 1 VIEW COMPARISON:  Chest x-ray 08/15/2014 FINDINGS: The heart and mediastinal contours are within normal limits.  No focal consolidation. No pulmonary edema. No pleural effusion. No pneumothorax. No acute osseous abnormality. IMPRESSION: No active disease. Electronically Signed   By: Iven Finn M.D.   On: 02/23/2023 22:36     The results of significant diagnostics from this hospitalization (including imaging, microbiology, ancillary and laboratory) are listed below for reference.     Microbiology: Recent Results (from the past 240 hour(s))  Blood Culture (routine x 2)     Status: None (Preliminary result)   Collection Time: 02/23/23  9:30 PM   Specimen: BLOOD  Result Value Ref Range Status   Specimen Description   Final    BLOOD LEFT ANTECUBITAL Performed at [facility] [address], [place], [place] [address]    Special Requests   Final    BOTTLES DRAWN AEROBIC AND ANAEROBIC Blood Culture results may not be optimal due to an excessive volume  of blood received in culture bottles Performed at [facility], [place] [address] [place], [place] [address]    Culture   Final    NO GROWTH 2 DAYS Performed at [facility] [address], [place], [place] [address]    Report Status PENDING  Incomplete  Blood Culture (routine x 2)     Status: None (Preliminary result)   Collection Time: 02/23/23  9:59 PM   Specimen: BLOOD  Result Value Ref Range Status   Specimen Description   Final    BLOOD RIGHT ANTECUBITAL Performed at [facility] [address] [place], [place] [address]    Special Requests   Final    BOTTLES DRAWN AEROBIC AND ANAEROBIC Blood Culture adequate volume Performed at [facility] [address], [place], [place] [address]    Culture   Final    NO GROWTH 2 DAYS Performed at [facility] [address], [place], [place] [address]    Report Status PENDING  Incomplete  Urine Culture     Status: Abnormal   Collection Time: 02/23/23  9:59 PM   Specimen: Urine, Random  Result Value Ref  Range Status   Specimen Description   Final    URINE, RANDOM Performed at [facility] [address], [place], [place] [address]    Special Requests   Final    NONE Reflexed from [id number] Performed at [facility], [place] [address] [place], [place] [address]    Culture >=100,000 COLONIES/mL ESCHERICHIA COLI (A)  Final   Report Status 02/26/2023 FINAL  Final   Organism ID, Bacteria ESCHERICHIA COLI (A)  Final      Susceptibility   Escherichia coli - MIC*    AMPICILLIN >=32 RESISTANT Resistant     CEFAZOLIN <=4 SENSITIVE Sensitive     CEFEPIME <=0.12 SENSITIVE Sensitive     CEFTRIAXONE <=0.25 SENSITIVE Sensitive     CIPROFLOXACIN <=0.25 SENSITIVE Sensitive     GENTAMICIN <=1 SENSITIVE Sensitive     IMIPENEM <=0.25 SENSITIVE Sensitive     NITROFURANTOIN 32 SENSITIVE Sensitive     TRIMETH/SULFA <=20 SENSITIVE Sensitive     AMPICILLIN/SULBACTAM 16 INTERMEDIATE Intermediate     PIP/TAZO <=4 SENSITIVE Sensitive     * >=100,000 COLONIES/mL ESCHERICHIA COLI  Resp panel by RT-PCR (RSV, Flu A&B, Covid) Anterior Nasal Swab     Status: None   Collection Time: 02/23/23 11:09 PM   Specimen: Anterior Nasal Swab  Result Value Ref Range Status   SARS Coronavirus 2 by RT PCR NEGATIVE NEGATIVE Final    Comment: (NOTE) SARS-CoV-2 target nucleic acids are NOT DETECTED.  The SARS-CoV-2 RNA is generally detectable in upper respiratory specimens during the acute phase of infection. The lowest concentration of SARS-CoV-2 viral copies this assay can detect is 138 copies/mL. A negative result does not preclude SARS-Cov-2 infection and should not be used as the sole basis for treatment or other patient management decisions. A negative result may occur with  improper specimen collection/handling, submission of specimen other than nasopharyngeal swab, presence of viral mutation(s) within the areas targeted by this assay, and inadequate number of viral copies(<138  copies/mL). A negative result must be combined with clinical observations, patient history, and epidemiological information. The expected result is Negative.  Fact Sheet for Patients:  EntrepreneurPulse.com.au  Fact Sheet for Healthcare Providers:  IncredibleEmployment.be  This test is no t yet approved or cleared by the [place] and  has been authorized for detection and/or diagnosis of SARS-CoV-2 by FDA under an Emergency Use Authorization (EUA). This EUA will remain  in effect (meaning this test can be used) for the duration of the COVID-19 declaration under Section 564(b)(1) of the Act, 21 U.S.C.section 360bbb-3(b)(1), unless the authorization is terminated  or revoked sooner.       Influenza A by PCR NEGATIVE NEGATIVE Final   Influenza B by PCR NEGATIVE NEGATIVE Final    Comment: (NOTE) The Xpert Xpress SARS-CoV-2/FLU/RSV plus assay is intended as an aid in the diagnosis of influenza from Nasopharyngeal swab specimens and should not be used as a sole basis for treatment. Nasal washings and aspirates are unacceptable for Xpert Xpress SARS-CoV-2/FLU/RSV testing.  Fact Sheet for Patients: EntrepreneurPulse.com.au  Fact Sheet for Healthcare Providers: IncredibleEmployment.be  This test is not yet approved or cleared by the [place] FDA and has been authorized for detection and/or diagnosis of SARS-CoV-2 by FDA under an Emergency Use Authorization (EUA). This EUA will remain in effect (meaning this test can be used) for the duration of the COVID-19 declaration under Section 564(b)(1) of the Act, 21 U.S.C. section 360bbb-3(b)(1), unless the authorization is terminated or revoked.     Resp Syncytial Virus by PCR NEGATIVE NEGATIVE Final    Comment: (NOTE) Fact Sheet for Patients: EntrepreneurPulse.com.au  Fact Sheet for Healthcare  Providers: IncredibleEmployment.be  This test is not yet approved or cleared by the [place] FDA and has been authorized for detection and/or diagnosis of SARS-CoV-2 by FDA under an Emergency Use Authorization (EUA). This EUA will remain in effect (meaning this test can be used) for the duration of the COVID-19 declaration under Section 564(b)(1) of the Act, 21 U.S.C. section 360bbb-3(b)(1), unless the authorization is terminated or revoked.  Performed at [facility], [place] [address] [place], [place] [address]   MRSA Next Gen by PCR, Nasal     Status: None   Collection Time: 02/24/23  5:24 AM   Specimen: Nasal Mucosa; Nasal Swab  Result Value Ref Range Status   MRSA by PCR Next Gen NOT DETECTED NOT DETECTED Final    Comment: (NOTE) The GeneXpert MRSA Assay (FDA approved for NASAL specimens only), is one component of a comprehensive MRSA colonization surveillance program. It is not intended to diagnose MRSA infection nor to guide or monitor treatment for MRSA infections. Test performance is not FDA approved in patients less than 81 years old. Performed at [facility], [place] [address] [place], [place] [address]      Labs: BNP (last 3 results) No results for input(s): "BNP" in the last 8760 hours. Basic Metabolic Panel: Recent Labs  Lab 02/23/23 2159 02/24/23 0414 02/24/23 0744 02/25/23 0858  NA 139  --  138 140  K 3.5  --  3.4* 4.5  CL 105  --  108 108  CO2 24  --  23 24  GLUCOSE 121*  --  192* 107*  BUN 11  --  11 12  CREATININE 1.50* 1.21* 1.50* 1.47*  CALCIUM 9.1  --  7.6* 8.5*   Liver Function Tests: Recent Labs  Lab 02/23/23 2159 02/24/23 0744  AST 22 25  ALT 16 19  ALKPHOS 75 49  BILITOT 0.9 1.0  PROT 7.5 4.8*  ALBUMIN 4.4 2.8*   No results for input(s): "LIPASE", "AMYLASE" in the last 168 hours. No results for input(s): "AMMONIA" in the last 168 hours. CBC: Recent Labs  Lab  02/23/23 2159 02/24/23 0414 02/24/23  [id number] 02/25/23 0858  WBC 9.3 5.8 6.5 7.9  NEUTROABS 8.3*  --   --   --   HGB 14.1 11.3* 11.0* 11.4*  HCT 42.9 35.8* 33.9* 35.6*  MCV 95.1 99.2 97.4 97.0  PLT 213 150 137* 159   Cardiac Enzymes: No results for input(s): "CKTOTAL", "CKMB", "CKMBINDEX", "TROPONINI" in the last 168 hours. BNP: Invalid input(s): "POCBNP" CBG: No results for input(s): "GLUCAP" in the last 168 hours. D-Dimer No results for input(s): "DDIMER" in the last 72 hours. Hgb A1c No results for input(s): "HGBA1C" in the last 72 hours. Lipid Profile No results for input(s): "CHOL", "HDL", "LDLCALC", "TRIG", "CHOLHDL", "LDLDIRECT" in the last 72 hours. Thyroid function studies Recent Labs    02/25/23 0858  TSH 3.241   Anemia work up No results for input(s): "VITAMINB12", "FOLATE", "FERRITIN", "TIBC", "IRON", "RETICCTPCT" in the last 72 hours. Urinalysis    Component Value Date/Time   COLORURINE YELLOW 02/23/2023 2159   APPEARANCEUR CLOUDY (A) 02/23/2023 2159   LABSPEC 1.009 02/23/2023 2159   PHURINE 6.0 02/23/2023 2159   GLUCOSEU NEGATIVE 02/23/2023 2159   HGBUR MODERATE (A) 02/23/2023 2159   BILIRUBINUR NEGATIVE 02/23/2023 2159   BILIRUBINUR ++ 09/03/2017 1404   KETONESUR NEGATIVE 02/23/2023 2159   PROTEINUR 100 (A) 02/23/2023 2159   UROBILINOGEN 0.2 07/30/2021 1903   NITRITE POSITIVE (A) 02/23/2023 2159   LEUKOCYTESUR LARGE (A) 02/23/2023 2159   Sepsis Labs Recent Labs  Lab 02/23/23 2159 02/24/23 0414 02/24/23 0744 02/25/23 0858  WBC 9.3 5.8 6.5 7.9   Microbiology Recent Results (from the past 240 hour(s))  Blood Culture (routine x 2)     Status: None (Preliminary result)   Collection Time: 02/23/23  9:30 PM   Specimen: BLOOD  Result Value Ref Range Status   Specimen Description   Final    BLOOD LEFT ANTECUBITAL Performed at [facility], [place] [address], [place], [place] [address]    Special Requests   Final    BOTTLES DRAWN  AEROBIC AND ANAEROBIC Blood Culture results may not be optimal due to an excessive volume of blood received in culture bottles Performed at [facility] [address] [place], [place] [address]    Culture   Final    NO GROWTH 2 DAYS Performed at [facility] [address] [place], [place] [address]    Report Status PENDING  Incomplete  Blood Culture (routine x 2)     Status: None (Preliminary result)   Collection Time: 02/23/23  9:59 PM   Specimen: BLOOD  Result Value Ref Range Status   Specimen Description   Final    BLOOD RIGHT ANTECUBITAL Performed at [facility] [address], [place], [place] [address]    Special Requests   Final    BOTTLES DRAWN AEROBIC AND ANAEROBIC Blood Culture adequate volume Performed at [facility] [address] [place], [place] [address]    Culture   Final    NO GROWTH 2 DAYS Performed at [facility] [address], [place],  [address]    Report Status PENDING  Incomplete  Urine Culture     Status: Abnormal   Collection Time: 02/23/23  9:59 PM   Specimen: Urine, Random  Result Value Ref Range Status   Specimen Description   Final    URINE, RANDOM Performed at [facility] [address], [place],  [address]    Special Requests   Final  NONE Reflexed from [id number] Performed at [facility], [place] [address], [place], [place] [address]    Culture >=100,000 COLONIES/mL ESCHERICHIA COLI (A)  Final   Report Status 02/26/2023 FINAL  Final   Organism ID, Bacteria ESCHERICHIA COLI (A)  Final      Susceptibility   Escherichia coli - MIC*    AMPICILLIN >=32 RESISTANT Resistant     CEFAZOLIN <=4 SENSITIVE Sensitive     CEFEPIME <=0.12 SENSITIVE Sensitive     CEFTRIAXONE <=0.25 SENSITIVE Sensitive     CIPROFLOXACIN <=0.25 SENSITIVE Sensitive     GENTAMICIN <=1 SENSITIVE Sensitive     IMIPENEM <=0.25 SENSITIVE Sensitive      NITROFURANTOIN 32 SENSITIVE Sensitive     TRIMETH/SULFA <=20 SENSITIVE Sensitive     AMPICILLIN/SULBACTAM 16 INTERMEDIATE Intermediate     PIP/TAZO <=4 SENSITIVE Sensitive     * >=100,000 COLONIES/mL ESCHERICHIA COLI  Resp panel by RT-PCR (RSV, Flu A&B, Covid) Anterior Nasal Swab     Status: None   Collection Time: 02/23/23 11:09 PM   Specimen: Anterior Nasal Swab  Result Value Ref Range Status   SARS Coronavirus 2 by RT PCR NEGATIVE NEGATIVE Final    Comment: (NOTE) SARS-CoV-2 target nucleic acids are NOT DETECTED.  The SARS-CoV-2 RNA is generally detectable in upper respiratory specimens during the acute phase of infection. The lowest concentration of SARS-CoV-2 viral copies this assay can detect is 138 copies/mL. A negative result does not preclude SARS-Cov-2 infection and should not be used as the sole basis for treatment or other patient management decisions. A negative result may occur with  improper specimen collection/handling, submission of specimen other than nasopharyngeal swab, presence of viral mutation(s) within the areas targeted by this assay, and inadequate number of viral copies(<138 copies/mL). A negative result must be combined with clinical observations, patient history, and epidemiological information. The expected result is Negative.  Fact Sheet for Patients:  EntrepreneurPulse.com.au  Fact Sheet for Healthcare Providers:  IncredibleEmployment.be  This test is no t yet approved or cleared by the [place] FDA and  has been authorized for detection and/or diagnosis of SARS-CoV-2 by FDA under an Emergency Use Authorization (EUA). This EUA will remain  in effect (meaning this test can be used) for the duration of the COVID-19 declaration under Section 564(b)(1) of the Act, 21 U.S.C.section 360bbb-3(b)(1), unless the authorization is terminated  or revoked sooner.       Influenza A by PCR NEGATIVE NEGATIVE Final    Influenza B by PCR NEGATIVE NEGATIVE Final    Comment: (NOTE) The Xpert Xpress SARS-CoV-2/FLU/RSV plus assay is intended as an aid in the diagnosis of influenza from Nasopharyngeal swab specimens and should not be used as a sole basis for treatment. Nasal washings and aspirates are unacceptable for Xpert Xpress SARS-CoV-2/FLU/RSV testing.  Fact Sheet for Patients: EntrepreneurPulse.com.au  Fact Sheet for Healthcare Providers: IncredibleEmployment.be  This test is not yet approved or cleared by the [place] FDA and has been authorized for detection and/or diagnosis of SARS-CoV-2 by FDA under an Emergency Use Authorization (EUA). This EUA will remain in effect (meaning this test can be used) for the duration of the COVID-19 declaration under Section 564(b)(1) of the Act, 21 U.S.C. section 360bbb-3(b)(1), unless the authorization is terminated or revoked.     Resp Syncytial Virus by PCR NEGATIVE NEGATIVE Final    Comment: (NOTE) Fact Sheet for Patients: EntrepreneurPulse.com.au  Fact Sheet for Healthcare Providers: IncredibleEmployment.be  This test is not yet approved or cleared  by the [place] and has been authorized for detection and/or diagnosis of SARS-CoV-2 by FDA under an Emergency Use Authorization (EUA). This EUA will remain in effect (meaning this test can be used) for the duration of the COVID-19 declaration under Section 564(b)(1) of the Act, 21 U.S.C. section 360bbb-3(b)(1), unless the authorization is terminated or revoked.  Performed at [facility], [place] [address] [place], [place] [address]   MRSA Next Gen by PCR, Nasal     Status: None   Collection Time: 02/24/23  5:24 AM   Specimen: Nasal Mucosa; Nasal Swab  Result Value Ref Range Status   MRSA by PCR Next Gen NOT DETECTED NOT DETECTED Final    Comment: (NOTE) The GeneXpert MRSA Assay (FDA approved  for NASAL specimens only), is one component of a comprehensive MRSA colonization surveillance program. It is not intended to diagnose MRSA infection nor to guide or monitor treatment for MRSA infections. Test performance is not FDA approved in patients less than 49 years old. Performed at [facility], [place] [address], [place], [place] [address]      Time coordinating discharge:  I have spent 35 minutes face to face with the patient and on the ward discussing the patients care, assessment, plan and disposition with other care givers. >50% of the time was devoted counseling the patient about the risks and benefits of treatment/Discharge disposition and coordinating care.   SIGNED:   Damita Lack, MD  [facility] 02/26/2023, 12:54 PM   If 7PM-7AM, please contact night-coverage

## 2023-03-01 LAB — CULTURE, BLOOD (ROUTINE X 2)
Culture: NO GROWTH
Culture: NO GROWTH
Special Requests: ADEQUATE

## 2023-03-03 ENCOUNTER — Inpatient Hospital Stay: Payer: Medicaid Other | Admitting: Family Medicine

## 2023-03-10 ENCOUNTER — Ambulatory Visit: Payer: Medicaid Other | Admitting: Family Medicine

## 2023-03-10 VITALS — BP 108/75 | HR 63 | Ht 64.0 in | Wt 159.0 lb

## 2023-03-10 DIAGNOSIS — N1832 Chronic kidney disease, stage 3b: Secondary | ICD-10-CM

## 2023-03-10 DIAGNOSIS — N261 Atrophy of kidney (terminal): Secondary | ICD-10-CM | POA: Diagnosis not present

## 2023-03-10 DIAGNOSIS — N390 Urinary tract infection, site not specified: Secondary | ICD-10-CM

## 2023-03-10 LAB — POCT URINALYSIS DIP (CLINITEK)
Blood, UA: NEGATIVE
Spec Grav, UA: 1.025 (ref 1.010–1.025)
pH, UA: 6 (ref 5.0–8.0)

## 2023-03-10 NOTE — Progress Notes (Signed)
   Subjective:    Patient ID: Heather Moon, female    DOB: 04/06/1981, 42 y.o.   MRN: [id number]  HPI  Patient arrives today for hospital follow up.  She was in the hospital pyelonephritis fever chills not feeling good Her kidney doctor recently started her on ARB Her blood pressure was low They tried midodrine then her heart rate dropped They did send her home since then she is cut way back on vaping she has done a better job of eating and drinking Patient states she is is having SOB.   She states she is having anxiety and is not sure if it is related to stress. Denies being depressed    Review of Systems     Objective:   Physical Exam  General-in no acute distress Eyes-no discharge Lungs-respiratory rate normal, CTA CV-no murmurs,RRR Extremities skin warm dry no edema Neuro grossly normal Behavior normal, alert No flank tenderness      Assessment & Plan:  Hospital follow-up Pyelonephritis seem to be resolving Chronic kidney disease level 3-patient was prophylaxed while at why she was put on blood pressure medicine with nephrology but I explained to the patient that this was trying to preserve her kidney function.  I have encouraged her to follow-up with Dr. Theador Hawthorne Urine with an occasional leukocyte urine culture sent Lab work pending Patient finished with antibiotics Follow-up here in 8 weeks  Patient under a lot of stress doing a better job managing it is tapering off of vaping

## 2023-03-11 LAB — BASIC METABOLIC PANEL (7)
BUN/Creatinine Ratio: 7 — ABNORMAL LOW (ref 9–23)
BUN: 9 mg/dL (ref 6–24)
CO2: 21 mmol/L (ref 20–29)
Chloride: 104 mmol/L (ref 96–106)
Creatinine, Ser: 1.24 mg/dL — ABNORMAL HIGH (ref 0.57–1.00)
Glucose: 90 mg/dL (ref 70–99)
Potassium: 5.1 mmol/L (ref 3.5–5.2)
Sodium: 142 mmol/L (ref 134–144)
eGFR: 55 mL/min/{1.73_m2} — ABNORMAL LOW (ref >59)

## 2023-03-12 LAB — URINE CULTURE

## 2023-04-16 ENCOUNTER — Telehealth: Payer: Medicaid Other | Admitting: Family Medicine

## 2023-04-16 DIAGNOSIS — Z20818 Contact with and (suspected) exposure to other bacterial communicable diseases: Secondary | ICD-10-CM

## 2023-04-16 MED ORDER — AMOXICILLIN 500 MG PO TABS: 500.0000 mg | ORAL_TABLET | Freq: Two times a day (BID) | ORAL | 0 refills | Status: AC

## 2023-04-16 NOTE — Patient Instructions (Addendum)
Heather Moon, thank you for joining Freddy Finner, NP for today's virtual visit.  While this provider is not your primary care provider (PCP), if your PCP is located in our provider database this encounter information will be shared with them immediately following your visit.   A [facility] MyChart account gives you access to today's visit and all your visits, tests, and labs performed at [facility] " click here if you don't have a [name] or go to [email or web address]  Consent: (Patient) Heather Moon provided verbal consent for this virtual visit at the beginning of the encounter.  Current Medications:  Current Outpatient Medications:    amoxicillin (AMOXIL) 500 MG tablet, Take 1 tablet (500 mg total) by mouth 2 (two) times daily for 10 days., Disp: 20 tablet, Rfl: 0   FLUoxetine (PROZAC) 10 MG capsule, TAKE 1 CAPSULE BY MOUTH EVERY DAY, Disp: 30 capsule, Rfl: 0   pantoprazole (PROTONIX) 40 MG tablet, TAKE 1 TABLET (40 MG TOTAL) BY MOUTH DAILY. FOR ACID REFLUX, Disp: 30 tablet, Rfl: 0   Medications ordered in this encounter:  Meds ordered this encounter  Medications   amoxicillin (AMOXIL) 500 MG tablet    Sig: Take 1 tablet (500 mg total) by mouth 2 (two) times daily for 10 days.    Dispense:  20 tablet    Refill:  0    Order Specific Question:   Supervising Provider    Answer:   Merrilee Jansky [id number]     *If you need refills on other medications prior to your next appointment, please contact your pharmacy*  Follow-Up: Call back or seek an in-person evaluation if the symptoms worsen or if the condition fails to improve as anticipated.  [facility] [phone]  Other Instructions Pharyngitis  Pharyngitis is a sore throat (pharynx). This is when there is redness, pain, and swelling in your throat. Most of the time, this condition gets better on its own. In some cases, you may need medicine. What are the causes? An  infection from a virus. An infection from bacteria. Allergies. What increases the risk? Being 51-72 years old. Being in crowded environments. These include: Daycares. Schools. Dormitories. Living in a place with cold temperatures outside. Having a weakened disease-fighting (immune) system. What are the signs or symptoms? Symptoms may vary depending on the cause. Common symptoms include: Sore throat. Tiredness (fatigue). Low-grade fever. Stuffy nose. Cough. Headache. Other symptoms may include: Glands in the neck (lymph nodes) that are swollen. Skin rashes. Film on the throat or tonsils. This can be caused by an infection from bacteria. Vomiting. Red, itchy eyes. Loss of appetite. Joint pain and muscle aches. Tonsils that are temporarily bigger than usual (enlarged). How is this treated? Many times, treatment is not needed. This condition usually gets better in 3-4 days without treatment. If the infection is caused by a bacteria, you may be need to take antibiotics. Follow these instructions at home: Medicines Take over-the-counter and prescription medicines only as told by your doctor. If you were prescribed an antibiotic medicine, take it as told by your doctor. Do not stop taking the antibiotic even if you start to feel better. Use throat lozenges or sprays to soothe your throat as told by your doctor. Children can get pharyngitis. Do not give your child aspirin. Managing pain To help with pain, try: Sipping warm liquids, such as: Broth. Herbal tea. Warm water. Eating or drinking cold or frozen liquids, such as frozen ice pops. Rinsing  your mouth (gargle) with a salt water mixture 3-4 times a day or as needed. To make salt water, dissolve -1 tsp (3-6 g) of salt in 1 cup (237 mL) of warm water. Do not swallow this mixture. Sucking on hard candy or throat lozenges. Putting a cool-mist humidifier in your bedroom at night to moisten the air. Sitting in the bathroom with  the door closed for 5-10 minutes while you run hot water in the shower.  General instructions  Do not smoke or use any products that contain nicotine or tobacco. If you need help quitting, ask your doctor. Rest as told by your doctor. Drink enough fluid to keep your pee (urine) pale yellow. How is this prevented? Wash your hands often for at least 20 seconds with soap and water. If soap and water are not available, use hand sanitizer. Do not touch your eyes, nose, or mouth with unwashed hands. Wash hands after touching these areas. Do not share cups or eating utensils. Avoid close contact with people who are sick. Contact a doctor if: You have large, tender lumps in your neck. You have a rash. You cough up green, yellow-brown, or bloody spit. Get help right away if: You have a stiff neck. You drool or cannot swallow liquids. You cannot drink or take medicines without vomiting. You have very bad pain that does not go away with medicine. You have problems breathing, and it is not from a stuffy nose. You have new pain and swelling in your knees, ankles, wrists, or elbows. These symptoms may be an emergency. Get help right away. Call your local emergency services (911 in the [place] Do not wait to see if the symptoms will go away. Do not drive yourself to the hospital. Summary Pharyngitis is a sore throat (pharynx). This is when there is redness, pain, and swelling in your throat. Most of the time, pharyngitis gets better on its own. Sometimes, you may need medicine. If you were prescribed an antibiotic medicine, take it as told by your doctor. Do not stop taking the antibiotic even if you start to feel better. This information is not intended to replace advice given to you by your health care provider. Make sure you discuss any questions you have with your health care provider. Document Revised: 02/28/2021 Document Reviewed: 02/28/2021 Elsevier Patient Education  2023 Elsevier  Inc.    If you have been instructed to have an in-person evaluation today at a local Urgent Care facility, please use the link below. It will take you to a list of all of our available [facility], including address, phone number and hours of operation. Please do not delay care.  [facility]  If you or a family member do not have a primary care provider, use the link below to schedule a visit and establish care. When you choose a [facility] primary care physician or advanced practice provider, you gain a long-term partner in health. Find a Primary Care Provider  Learn more about [facility]'s in-office and virtual care options: [facility] - Get Care Now

## 2023-04-16 NOTE — Progress Notes (Signed)
Virtual Visit Consent   Heather Moon, you are scheduled for a virtual visit with a [facility] provider today. Just as with appointments in the office, your consent must be obtained to participate. Your consent will be active for this visit and any virtual visit you may have with one of our providers in the next 365 days. If you have a MyChart account, a copy of this consent can be sent to you electronically.  As this is a virtual visit, video technology does not allow for your provider to perform a traditional examination. This may limit your provider's ability to fully assess your condition. If your provider identifies any concerns that need to be evaluated in person or the need to arrange testing (such as labs, EKG, etc.), we will make arrangements to do so. Although advances in technology are sophisticated, we cannot ensure that it will always work on either your end or our end. If the connection with a video visit is poor, the visit may have to be switched to a telephone visit. With either a video or telephone visit, we are not always able to ensure that we have a secure connection.  By engaging in this virtual visit, you consent to the provision of healthcare and authorize for your insurance to be billed (if applicable) for the services provided during this visit. Depending on your insurance coverage, you may receive a charge related to this service.  I need to obtain your verbal consent now. Are you willing to proceed with your visit today? Heather Moon has provided verbal consent on 04/16/2023 for a virtual visit (video or telephone). Freddy Finner, NP  Date: 04/16/2023 11:47 AM  Virtual Visit via Audio Note   I, Freddy Finner, connected with  Heather Moon  ([id number], [birth year]) on 04/16/23 at 11:45 AM EDT by a audio-enabled telemedicine application and verified that I am speaking with the correct person using two identifiers.  Location: Patient: Virtual Visit Location Patient:  Home Provider: Virtual Visit Location Provider: Home Office   I discussed the limitations of evaluation and management by telemedicine and the availability of in person appointments. The patient expressed understanding and agreed to proceed.    History of Present Illness: Heather Moon is a 42 y.o. who identifies as a female who was assigned female at birth, and is being seen today for strep throat exposure.   Onset was sore throat, tonsils swollen Associated symptoms are headache, and trouble swallowing Modifying factors are tylenol mild help Denies chest pain, shortness of breath, fevers, chills  Exposure to sick contacts- known- grandson has strep COVID test: none   Problems:  Patient Active Problem List   Diagnosis Date Noted   Stage 3b chronic kidney disease (CKD) (HCC) 03/10/2023   Acute pyelonephritis 02/24/2023   AKI (acute kidney injury) (HCC) 02/24/2023   Sepsis (HCC) 02/24/2023   Sepsis secondary to UTI (HCC) 02/24/2023   Gastroesophageal reflux disease without esophagitis 05/10/2022   Atrophic kidney 04/25/2022   Acute cystitis without hematuria 04/25/2022   Mixed stress and urge urinary incontinence 01/12/2018   Chronic pain of right knee 12/13/2016   Chronic constipation 11/18/2016   Anxiety and depression 09/20/2016   History of hysterectomy, supracervical 09/19/2016   Insomnia 10/20/2015   Herniated cervical disc 03/24/2013   Migraine headache without aura 03/05/2013    Allergies:  Allergies  Allergen Reactions   Midodrine Other (See Comments)    Severe bradycardia    Ambien [Zolpidem]     Causes her agitation  Doxycycline Hives   Hydrocodone Nausea And Vomiting   Lexapro [Escitalopram] Nausea Only    Experienced headache and nausea    Macrolides And Ketolides Nausea Only   Trazodone And Nefazodone Other (See Comments)    Headaches   Medications:  Current Outpatient Medications:    FLUoxetine (PROZAC) 10 MG capsule, TAKE 1 CAPSULE BY MOUTH EVERY  DAY, Disp: 30 capsule, Rfl: 0   pantoprazole (PROTONIX) 40 MG tablet, TAKE 1 TABLET (40 MG TOTAL) BY MOUTH DAILY. FOR ACID REFLUX, Disp: 30 tablet, Rfl: 0  Observations/Objective:    Speech is clear and coherent with logical content.  Patient is alert and oriented at baseline.    Assessment and Plan:  1. Strep throat exposure  - amoxicillin (AMOXIL) 500 MG tablet; Take 1 tablet (500 mg total) by mouth 2 (two) times daily for 10 days.  Dispense: 20 tablet; Refill: 0    -Suspect bacterial pharyngitis - Discussed OTC management - May use tylenol and ibuprofen alternating every 3-4 hours as  needed for pain, fevers, body aches - Salt water gargles - Hydration and voice rest - Seek in person evaluation if worsening or fails to improve  Reviewed side effects, risks and benefits of medication.    Patient acknowledged agreement and understanding of the plan.   Past Medical, Surgical, Social History, Allergies, and Medications have been Reviewed.    Follow Up Instructions: I discussed the assessment and treatment plan with the patient. The patient was provided an opportunity to ask questions and all were answered. The patient agreed with the plan and demonstrated an understanding of the instructions.  A copy of instructions were sent to the patient via MyChart unless otherwise noted below.    The patient was advised to call back or seek an in-person evaluation if the symptoms worsen or if the condition fails to improve as anticipated.  Time:  I spent 6 minutes with the patient via telehealth technology discussing the above problems/concerns.    Freddy Finner, NP

## 2023-05-05 ENCOUNTER — Ambulatory Visit: Payer: Medicaid Other | Admitting: Family Medicine

## 2023-05-09 ENCOUNTER — Telehealth: Payer: Medicaid Other | Admitting: Family Medicine

## 2023-05-09 DIAGNOSIS — R3989 Other symptoms and signs involving the genitourinary system: Secondary | ICD-10-CM

## 2023-05-09 DIAGNOSIS — Z87448 Personal history of other diseases of urinary system: Secondary | ICD-10-CM

## 2023-05-09 NOTE — Progress Notes (Signed)
[place]   Needs to be seen in person with recent history of pyelonephritis that she was inpt with in March.   Reviewed side effects, risks and benefits of medication.    Patient acknowledged agreement and understanding of the plan.   Past Medical, Surgical, Social History, Allergies, and Medications have been Reviewed.

## 2023-06-01 ENCOUNTER — Encounter (HOSPITAL_COMMUNITY): Payer: Self-pay

## 2023-06-01 ENCOUNTER — Other Ambulatory Visit: Payer: Self-pay

## 2023-06-01 ENCOUNTER — Emergency Department (HOSPITAL_COMMUNITY)
Admission: EM | Admit: 2023-06-01 | Discharge: 2023-06-02 | Disposition: A | Discharge status: Home / Self Care | Payer: 59 | Attending: Emergency Medicine | Admitting: Emergency Medicine

## 2023-06-01 DIAGNOSIS — R350 Frequency of micturition: Secondary | ICD-10-CM | POA: Diagnosis present

## 2023-06-01 DIAGNOSIS — N12 Tubulo-interstitial nephritis, not specified as acute or chronic: Secondary | ICD-10-CM | POA: Diagnosis not present

## 2023-06-01 DIAGNOSIS — N189 Chronic kidney disease, unspecified: Secondary | ICD-10-CM | POA: Diagnosis not present

## 2023-06-01 HISTORY — DX: Disorder of kidney and ureter, unspecified: N28.9

## 2023-06-01 LAB — URINALYSIS, ROUTINE W REFLEX MICROSCOPIC
Bilirubin Urine: NEGATIVE
Glucose, UA: NEGATIVE mg/dL
Hgb urine dipstick: NEGATIVE
Ketones, ur: NEGATIVE mg/dL
Nitrite: NEGATIVE
Protein, ur: NEGATIVE mg/dL
Specific Gravity, Urine: 1.012 (ref 1.005–1.030)
WBC, UA: >50 WBC/hpf (ref 0–5)
pH: 6 (ref 5.0–8.0)

## 2023-06-01 LAB — CBC
HCT: 38.9 % (ref 36.0–46.0)
Hemoglobin: 12.7 g/dL (ref 12.0–15.0)
MCH: 31.6 pg (ref 26.0–34.0)
MCHC: 32.6 g/dL (ref 30.0–36.0)
MCV: 96.8 fL (ref 80.0–100.0)
Platelets: 240 10*3/uL (ref 150–400)
RBC: 4.02 MIL/uL (ref 3.87–5.11)
RDW: 12.7 % (ref 11.5–15.5)
WBC: 8.2 10*3/uL (ref 4.0–10.5)
nRBC: 0 % (ref 0.0–0.2)

## 2023-06-01 LAB — BASIC METABOLIC PANEL
Anion gap: 12 (ref 5–15)
BUN: 15 mg/dL (ref 6–20)
CO2: 23 mmol/L (ref 22–32)
Calcium: 9.2 mg/dL (ref 8.9–10.3)
Chloride: 105 mmol/L (ref 98–111)
Creatinine, Ser: 1.51 mg/dL — ABNORMAL HIGH (ref 0.44–1.00)
GFR, Estimated: 43 mL/min — ABNORMAL LOW (ref >60)
Glucose, Bld: 139 mg/dL — ABNORMAL HIGH (ref 70–99)
Potassium: 4.2 mmol/L (ref 3.5–5.1)
Sodium: 140 mmol/L (ref 135–145)

## 2023-06-01 MED ORDER — ACETAMINOPHEN 325 MG PO TABS
650.0000 mg | ORAL_TABLET | ORAL | Status: AC
  Administered 2023-06-01: 650 mg via ORAL
  Filled 2023-06-01: qty 2

## 2023-06-01 NOTE — ED Triage Notes (Addendum)
Patient arrives POV from home c/o UTI sxs for the past 7 days. Patient reports burning on urination, increased urinary frequency and urgency, nausea, and lower back pain. Patient denies having a fever, but reports having migraines. Patient has hx of stage 3 kidney disease and was hospitalized at [facility] back in March 2024 d/t sepsis from UTI.

## 2023-06-02 DIAGNOSIS — N12 Tubulo-interstitial nephritis, not specified as acute or chronic: Secondary | ICD-10-CM | POA: Diagnosis not present

## 2023-06-02 LAB — LACTIC ACID, PLASMA: Lactic Acid, Venous: 1.6 mmol/L (ref 0.5–1.9)

## 2023-06-02 MED ORDER — SODIUM CHLORIDE 0.9 % IV SOLN
1.0000 g | Freq: Once | INTRAVENOUS | Status: AC
  Administered 2023-06-02: 1 g via INTRAVENOUS
  Filled 2023-06-02: qty 10

## 2023-06-02 MED ORDER — CEPHALEXIN 500 MG PO CAPS: 500.0000 mg | ORAL_CAPSULE | Freq: Three times a day (TID) | ORAL | 0 refills | Status: DC

## 2023-06-02 NOTE — ED Provider Notes (Signed)
[place] EMERGENCY DEPARTMENT AT [facility] Provider Note   CSN: [id number] Arrival date & time: 06/01/23  2127     History  Chief Complaint  Patient presents with   Urinary Frequency    Heather Moon is a 43 y.o. female.  The history is provided by the patient and medical records.   42 year old female with history of anxiety, depression, atrophic kidney, GERD, migraine headaches, chronic kidney disease, presenting to the ED with concern of UTI.  States for the past week she has been having dysuria, urinary frequency, urgency and nausea.  Today she began having bilateral flank pain.  She does have longstanding history of UTIs from childhood.  She was hospitalized in March (3 months ago) for sepsis secondary to pyelonephritis.  She has not had any fever/chills/sweats like she did previously.  Home Medications Prior to Admission medications   Medication Sig Start Date End Date Taking? Authorizing Provider  cephALEXin (KEFLEX) 500 MG capsule Take 1 capsule (500 mg total) by mouth 3 (three) times daily. 06/02/23  Yes Garlon Hatchet, PA-C  FLUoxetine (PROZAC) 10 MG capsule TAKE 1 CAPSULE BY MOUTH EVERY DAY 09/27/22   Campbell Riches, NP  pantoprazole (PROTONIX) 40 MG tablet TAKE 1 TABLET (40 MG TOTAL) BY MOUTH DAILY. FOR ACID REFLUX 05/31/22   Campbell Riches, NP      Allergies    Midodrine, Ambien [zolpidem], Doxycycline, Hydrocodone, Lexapro [escitalopram], Macrolides and ketolides, and Trazodone and nefazodone    Review of Systems   Review of Systems  Genitourinary:  Positive for dysuria, flank pain, frequency and urgency.  All other systems reviewed and are negative.   Physical Exam Updated Vital Signs BP 104/60 (BP Location: Right Arm)   Pulse 61   Temp 97.9 F (36.6 C)   Resp 18   Ht 5\' 4"  (1.626 m)   Wt 72.1 kg   SpO2 98%   BMI 27.29 kg/m   Physical Exam Vitals and nursing note reviewed.  Constitutional:      Appearance: She is  well-developed.     Comments: Appears well  HENT:     Head: Normocephalic and atraumatic.  Eyes:     Conjunctiva/sclera: Conjunctivae normal.     Pupils: Pupils are equal, round, and reactive to light.  Cardiovascular:     Rate and Rhythm: Normal rate and regular rhythm.     Heart sounds: Normal heart sounds.  Pulmonary:     Effort: Pulmonary effort is normal.     Breath sounds: Normal breath sounds.  Abdominal:     General: Bowel sounds are normal.     Palpations: Abdomen is soft.     Tenderness: There is no abdominal tenderness.     Comments: No real elicited CVA tenderness  Musculoskeletal:        General: Normal range of motion.     Cervical back: Normal range of motion.  Skin:    General: Skin is warm and dry.  Neurological:     Mental Status: She is alert and oriented to person, place, and time.     ED Results / Procedures / Treatments   Labs (all labs ordered are listed, but only abnormal results are displayed) Labs Reviewed  URINALYSIS, ROUTINE W REFLEX MICROSCOPIC - Abnormal; Notable for the following components:      Result Value   APPearance CLOUDY (*)    Leukocytes,Ua LARGE (*)    Bacteria, UA RARE (*)    All other components within normal limits  BASIC METABOLIC PANEL - Abnormal; Notable for the following components:   Glucose, Bld 139 (*)    Creatinine, Ser 1.51 (*)    GFR, Estimated 43 (*)    All other components within normal limits  CULTURE, BLOOD (ROUTINE X 2)  CULTURE, BLOOD (ROUTINE X 2)  URINE CULTURE  CBC  LACTIC ACID, PLASMA    EKG None  Radiology No results found.  Procedures Procedures    Medications Ordered in ED Medications  acetaminophen (TYLENOL) tablet 650 mg (650 mg Oral Given 06/01/23 2240)  cefTRIAXone (ROCEPHIN) 1 g in sodium chloride 0.9 % 100 mL IVPB (0 g Intravenous Stopped 06/02/23 0258)    ED Course/ Medical Decision Making/ A&P                             Medical Decision Making Amount and/or Complexity of  Data Reviewed Labs: ordered. ECG/medicine tests: ordered and independent interpretation performed.  Risk OTC drugs. Prescription drug management.   42 year old female presenting to the ED with 1 week of UTI type symptoms and new flank pain that began today.  History of sepsis in March 2024 secondary to pyelonephritis.  Also has chronic CKD.  She is afebrile and nontoxic in appearance here.  Her abdominal exam is benign.  She does not have any real CVA tenderness on my exam.  Labs were obtained from triage without leukocytosis.  Her serum creatinine is 1.5 which appears around her baseline when compared with prior.  Her UA does appear infectious with bacteria noted and >50 WBCs.  Given her history of recent sepsis, lactate was added which is normal at 1.6.  Blood and urine cultures have been sent.  Last urine culture grew out E. Coli.  She was given dose of IV rocephin here.  She remains without any fever, tachycardia, or hypotension.  Given reassuring labs and lactate, feel sepsis is less likely.  Will plan to discharge home with Keflex.  I encourage close PCP follow-up.  She can return here for any new or acute changes.  Final Clinical Impression(s) / ED Diagnoses Final diagnoses:  Pyelonephritis    Rx / DC Orders ED Discharge Orders          Ordered    cephALEXin (KEFLEX) 500 MG capsule  3 times daily        06/02/23 0337              Garlon Hatchet, PA-C 06/02/23 0409    Mesner, Barbara Cower, MD 06/02/23 [phone]

## 2023-06-02 NOTE — Discharge Instructions (Signed)
Your labs today were reassuring-- normal white count and lactic, not clinically consistent with sepsis. We did sent blood and urine cultures, you will be notified if abnormal. Take the prescribed medication as directed. Follow-up with your primary care doctor. Return to the ED for new or worsening symptoms.

## 2023-06-07 LAB — CULTURE, BLOOD (ROUTINE X 2)
Culture: NO GROWTH
Culture: NO GROWTH

## 2023-08-20 ENCOUNTER — Other Ambulatory Visit: Payer: Self-pay

## 2023-08-20 ENCOUNTER — Encounter (HOSPITAL_COMMUNITY): Payer: Self-pay | Admitting: Emergency Medicine

## 2023-08-20 ENCOUNTER — Ambulatory Visit (HOSPITAL_COMMUNITY)
Admission: EM | Admit: 2023-08-20 | Discharge: 2023-08-20 | Disposition: A | Discharge status: Home / Self Care | Payer: 59 | Attending: Family Medicine | Admitting: Family Medicine

## 2023-08-20 DIAGNOSIS — N12 Tubulo-interstitial nephritis, not specified as acute or chronic: Secondary | ICD-10-CM | POA: Insufficient documentation

## 2023-08-20 LAB — POCT URINALYSIS DIP (MANUAL ENTRY)
Bilirubin, UA: NEGATIVE
Blood, UA: NEGATIVE
Glucose, UA: NEGATIVE mg/dL
Ketones, POC UA: NEGATIVE mg/dL
Nitrite, UA: NEGATIVE
Protein Ur, POC: NEGATIVE mg/dL
Spec Grav, UA: 1.01 (ref 1.010–1.025)
Urobilinogen, UA: 0.2 U/dL
pH, UA: 6.5 (ref 5.0–8.0)

## 2023-08-20 MED ORDER — CYCLOBENZAPRINE HCL 10 MG PO TABS: ORAL_TABLET | ORAL | 0 refills | Status: DC

## 2023-08-20 MED ORDER — CEPHALEXIN 500 MG PO CAPS: 500.0000 mg | ORAL_CAPSULE | Freq: Two times a day (BID) | ORAL | 0 refills | Status: DC

## 2023-08-20 NOTE — ED Triage Notes (Addendum)
Onset one week ago of a headache.  Has had tylenol.  Patient has had burning with urination, cloudy urine, urinating more frequently. Patient reports having chills last night.   Patient has been trying to drink a lot of water and using azo.  Has used azo x 3 .  Patient has not had azo for the last 2 days.    Patient has taken tylenol

## 2023-08-20 NOTE — Discharge Instructions (Addendum)
You have had labs (urine culture) sent today. We will call you with any significant abnormalities or if there is need to begin or change treatment or pursue further follow up.  You may also review your test results online through MyChart. If you do not have a MyChart account, instructions to sign up should be on your discharge paperwork.  

## 2023-08-22 LAB — URINE CULTURE: Culture: >=100000 — AB

## 2023-08-23 NOTE — ED Provider Notes (Signed)
[facility]    ASSESSMENT & PLAN:  1. Pyelonephritis    Reports temp; at home; Tylenol PTA. Denies n/v. Benign abdomen. Begin: Meds ordered this encounter  Medications   cephALEXin (KEFLEX) 500 MG capsule    Sig: Take 1 capsule (500 mg total) by mouth 2 (two) times daily.    Dispense:  14 capsule    Refill:  0   cyclobenzaprine (FLEXERIL) 10 MG tablet    Sig: Take 1 tablet by mouth before bed as needed for muscle spasm. Warning: May cause drowsiness.    Dispense:  10 tablet    Refill:  0    Urine culture sent. Will notify patient of any significant results. Ensure proper hydration. Will follow up with her PCP or here if not showing improvement over the next 48 hours, sooner if needed.  Outlined signs and symptoms indicating need for more acute intervention. Patient verbalized understanding. After Visit Summary given.  SUBJECTIVE:  Heather Moon is a 42 y.o. female who complains of urinary frequency, urgency and dysuria for the past four. Without associated vaginal discharge or bleeding. Fever PTA; Tylenol has helped. Gross hematuria: not present. No specific aggravating or alleviating factors reported. No LE edema. Normal PO intake without n/v/d. Without specific abdominal pain. Ambulatory without difficulty. H/O pyelo. LMP: No LMP recorded. Patient has had a hysterectomy.  OBJECTIVE:  Vitals:   08/20/23 1948  BP: 98/70  Pulse: 68  Resp: 18  Temp: 99.3 F (37.4 C)  TempSrc: Oral  SpO2: 97%   General appearance: alert; no distress HENT: oropharynx: moist Lungs: unlabored respirations Abdomen: soft, non-tender; o masses or organomegaly; no guarding or rebound tenderness Back: no CVA tenderness Extremities: no edema; symmetrical with no gross deformities Skin: warm and dry Neurologic: normal gait Psychological: alert and cooperative; normal mood and affect  Labs Reviewed  URINE CULTURE - Abnormal; Notable for the following components:      Result  Value   Culture   (*)    Value: >=100,000 COLONIES/mL ESCHERICHIA COLI Confirmed Extended Spectrum Beta-Lactamase Producer (ESBL).  In bloodstream infections from ESBL organisms, carbapenems are preferred over piperacillin/tazobactam. They are shown to have a lower risk of mortality.    Organism ID, Bacteria ESCHERICHIA COLI (*)    All other components within normal limits  POCT URINALYSIS DIP (MANUAL ENTRY) - Abnormal; Notable for the following components:   Clarity, UA hazy (*)    Leukocytes, UA Moderate (2+) (*)    All other components within normal limits    Allergies  Allergen Reactions   Midodrine Other (See Comments)    Severe bradycardia    Ambien [Zolpidem]     Causes her agitation   Doxycycline Hives   Hydrocodone Nausea And Vomiting   Lexapro [Escitalopram] Nausea Only    Experienced headache and nausea    Macrolides And Ketolides Nausea Only   Trazodone And Nefazodone Other (See Comments)    Headaches    Past Medical History:  Diagnosis Date   Blood transfusion without reported diagnosis    Cholelithiasis    Chronic insomnia    Constipation, chronic    GERD (gastroesophageal reflux disease)    takes Prilosec as needed   Kidney disease    stage 3 per pt   Kidney infection    Migraine    Migraine headache    Renal disorder    stent to L kidney since 42 yo   UTI (lower urinary tract infection)    Social History  Socioeconomic History   Marital status: Significant Other    Spouse name: Not on file   Number of children: 5   Years of education: 10 th   Highest education level: Not on file  Occupational History    Comment: Homemaker  Tobacco Use   Smoking status: Never   Smokeless tobacco: Never  Vaping Use   Vaping status: Every Day   Substances: Nicotine, Nicotine-salt  Substance and Sexual Activity   Alcohol use: No   Drug use: No   Sexual activity: Yes    Partners: Male    Birth control/protection: Surgical  Other Topics Concern   Not on  file  Social History Narrative   Patient lives at home with her boyfriend. Patient is a homemaker. Patient has 10 th grade education.   Caffeine- sweet tea three cups daily.   Left handed.   Social Determinants of Health   Financial Resource Strain: Low Risk  (09/26/2022)   Overall Financial Resource Strain (CARDIA)    Difficulty of Paying Living Expenses: Not very hard  Food Insecurity: No Food Insecurity (02/25/2023)   Hunger Vital Sign    Worried About Running Out of Food in the Last Year: Never true    Ran Out of Food in the Last Year: Never true  Transportation Needs: No Transportation Needs (02/25/2023)   PRAPARE - Administrator, Civil Service (Medical): No    Lack of Transportation (Non-Medical): No  Physical Activity: Sufficiently Active (09/26/2022)   Exercise Vital Sign    Days of Exercise per Week: 7 days    Minutes of Exercise per Session: 30 min  Stress: Stress Concern Present (09/26/2022)   [employer] of Occupational Health - Occupational Stress Questionnaire    Feeling of Stress : Very much  Social Connections: Moderately Isolated (09/26/2022)   Social Connection and Isolation Panel [NHANES]    Frequency of Communication with Friends and Family: Three times a week    Frequency of Social Gatherings with Friends and Family: More than three times a week    Attends Religious Services: Never    Database administrator or Organizations: No    Attends Banker Meetings: Never    Marital Status: Living with partner  Intimate Partner Violence: Not At Risk (02/25/2023)   Humiliation, Afraid, Rape, and Kick questionnaire    Fear of Current or Ex-Partner: No    Emotionally Abused: No    Physically Abused: No    Sexually Abused: No   Family History  Problem Relation Age of Onset   Diabetes Mother    Cancer Father    Cancer Maternal Grandmother    Cirrhosis Maternal Glynda Jaeger, MD 08/23/23 1006

## 2023-08-24 ENCOUNTER — Telehealth: Payer: Self-pay

## 2023-08-24 MED ORDER — SULFAMETHOXAZOLE-TRIMETHOPRIM 800-160 MG PO TABS: 1.0000 | ORAL_TABLET | Freq: Two times a day (BID) | ORAL | 0 refills | Status: AC

## 2023-09-21 DIAGNOSIS — H5213 Myopia, bilateral: Secondary | ICD-10-CM | POA: Diagnosis not present

## 2023-11-10 ENCOUNTER — Other Ambulatory Visit (HOSPITAL_COMMUNITY)
Admission: RE | Admit: 2023-11-10 | Discharge: 2023-11-10 | Disposition: A | Discharge status: Home / Self Care | Payer: 59 | Source: Ambulatory Visit | Attending: Nephrology | Admitting: Nephrology

## 2023-11-10 DIAGNOSIS — N2581 Secondary hyperparathyroidism of renal origin: Secondary | ICD-10-CM | POA: Insufficient documentation

## 2023-11-10 DIAGNOSIS — R809 Proteinuria, unspecified: Secondary | ICD-10-CM | POA: Diagnosis not present

## 2023-11-10 DIAGNOSIS — N270 Small kidney, unilateral: Secondary | ICD-10-CM | POA: Insufficient documentation

## 2023-11-10 DIAGNOSIS — N1832 Chronic kidney disease, stage 3b: Secondary | ICD-10-CM | POA: Insufficient documentation

## 2023-11-10 DIAGNOSIS — E2740 Unspecified adrenocortical insufficiency: Secondary | ICD-10-CM | POA: Insufficient documentation

## 2023-11-10 LAB — CBC
HCT: 42.9 % (ref 36.0–46.0)
Hemoglobin: 14.2 g/dL (ref 12.0–15.0)
MCH: 32 pg (ref 26.0–34.0)
MCHC: 33.1 g/dL (ref 30.0–36.0)
MCV: 96.6 fL (ref 80.0–100.0)
Platelets: 235 10*3/uL (ref 150–400)
RBC: 4.44 MIL/uL (ref 3.87–5.11)
RDW: 12.5 % (ref 11.5–15.5)
WBC: 7.1 10*3/uL (ref 4.0–10.5)
nRBC: 0 % (ref 0.0–0.2)

## 2023-11-10 LAB — RENAL FUNCTION PANEL
Albumin: 4.1 g/dL (ref 3.5–5.0)
Anion gap: 7 (ref 5–15)
BUN: 11 mg/dL (ref 6–20)
CO2: 25 mmol/L (ref 22–32)
Calcium: 8.9 mg/dL (ref 8.9–10.3)
Chloride: 105 mmol/L (ref 98–111)
Creatinine, Ser: 1.52 mg/dL — ABNORMAL HIGH (ref 0.44–1.00)
GFR, Estimated: 43 mL/min — ABNORMAL LOW (ref >60)
Glucose, Bld: 118 mg/dL — ABNORMAL HIGH (ref 70–99)
Phosphorus: 3.9 mg/dL (ref 2.5–4.6)
Potassium: 4.3 mmol/L (ref 3.5–5.1)
Sodium: 137 mmol/L (ref 135–145)

## 2023-11-10 LAB — PROTEIN / CREATININE RATIO, URINE
Creatinine, Urine: 195 mg/dL
Protein Creatinine Ratio: 0.09 mg/mg{creat} (ref 0.00–0.15)
Total Protein, Urine: 17 mg/dL

## 2023-11-10 LAB — TSH: TSH: 1.293 u[IU]/mL (ref 0.350–4.500)

## 2023-11-10 LAB — CORTISOL: Cortisol, Plasma: 6.3 ug/dL

## 2023-11-14 DIAGNOSIS — R809 Proteinuria, unspecified: Secondary | ICD-10-CM | POA: Diagnosis not present

## 2023-11-14 DIAGNOSIS — E2740 Unspecified adrenocortical insufficiency: Secondary | ICD-10-CM | POA: Diagnosis not present

## 2023-11-14 DIAGNOSIS — N2581 Secondary hyperparathyroidism of renal origin: Secondary | ICD-10-CM | POA: Diagnosis not present

## 2023-11-14 DIAGNOSIS — N270 Small kidney, unilateral: Secondary | ICD-10-CM | POA: Diagnosis not present

## 2023-11-25 ENCOUNTER — Telehealth: Payer: Self-pay | Admitting: *Deleted

## 2023-11-26 ENCOUNTER — Other Ambulatory Visit: Payer: Self-pay | Admitting: Family Medicine

## 2023-11-27 ENCOUNTER — Telehealth: Payer: Self-pay | Admitting: Family Medicine

## 2023-11-27 NOTE — Telephone Encounter (Signed)
Nurse's-please talk with Heather Moon Although antiviral ointments can be used the medical studies show that Valtrex tablets works the best for fever blisters 1 g tablet, take 2 tablets followed 12 hours later by 2 more tablets will actually do a very good job of taking care of a fever blister If she would like to do this use the above directions and she may have 12 tablets with 2 refills  If for some reason she does not want to do Valtrex tablets and she would rather do acyclovir ointment you can do it to but the ointment apply 4-5 times daily for 5 days

## 2023-11-27 NOTE — Telephone Encounter (Signed)
See other message

## 2023-11-27 NOTE — Telephone Encounter (Signed)
Patient is needing a ointment for her fever blister she stated it is burning really bad and itching [facility] uses [facility]

## 2023-11-28 ENCOUNTER — Other Ambulatory Visit: Payer: Self-pay | Admitting: Family Medicine

## 2023-11-28 MED ORDER — MUPIROCIN 2 % EX OINT: TOPICAL_OINTMENT | CUTANEOUS | 1 refills | Status: DC

## 2023-11-28 NOTE — Telephone Encounter (Signed)
Bactroban ointment was sent to the pharmacy as requested

## 2023-11-28 NOTE — Telephone Encounter (Signed)
Patient states the fever blister is infected and she is wanting an antibiotic ointment to help it heal and clear- it is very dry and cracking  [facility]

## 2024-01-14 ENCOUNTER — Emergency Department (HOSPITAL_COMMUNITY)
Admission: EM | Admit: 2024-01-14 | Discharge: 2024-01-14 | Disposition: A | Discharge status: Home / Self Care | Payer: 59 | Attending: Student | Admitting: Student

## 2024-01-14 ENCOUNTER — Encounter (HOSPITAL_COMMUNITY): Payer: Self-pay

## 2024-01-14 ENCOUNTER — Emergency Department (HOSPITAL_COMMUNITY): Payer: 59

## 2024-01-14 ENCOUNTER — Other Ambulatory Visit: Payer: Self-pay

## 2024-01-14 DIAGNOSIS — N189 Chronic kidney disease, unspecified: Secondary | ICD-10-CM | POA: Insufficient documentation

## 2024-01-14 DIAGNOSIS — M545 Low back pain, unspecified: Secondary | ICD-10-CM | POA: Insufficient documentation

## 2024-01-14 MED ORDER — DIAZEPAM 5 MG PO TABS
5.0000 mg | ORAL_TABLET | Freq: Once | ORAL | Status: AC
  Administered 2024-01-14: 5 mg via ORAL
  Filled 2024-01-14: qty 1

## 2024-01-14 MED ORDER — PREDNISONE 10 MG PO TABS: 40.0000 mg | ORAL_TABLET | Freq: Every day | ORAL | 0 refills | Status: AC

## 2024-01-14 MED ORDER — METHOCARBAMOL 500 MG PO TABS: 500.0000 mg | ORAL_TABLET | Freq: Two times a day (BID) | ORAL | 0 refills | Status: DC

## 2024-01-14 NOTE — ED Provider Notes (Signed)
[place] EMERGENCY DEPARTMENT AT [facility] Provider Note   CSN: [id number] Arrival date & time: 01/14/24  1008     History  Chief Complaint  Patient presents with   Back Pain    Heather Moon is a 43 y.o. female.  Patient is a 43 year old female with a past medical history chronic kidney disease who presents to the emergency department with a chief complaint of low back pain with intermittent radiation down her right leg which has been ongoing for approximately past 3 days.  Patient notes that she has had no recent falls or blunt trauma to her back.  She denies any changes in urination to include dysuria or hematuria.  She denies any associated urinary bowel incontinence, saddle paresthesias, gait changes, fever, chills, history of IV drug use, age or HIV, history of cancer, history chronic steroid use.  Patient does note that she has been lifting her grandchildren and feels as though this may be how she injured her back.  She denies any associated abdominal pain, nausea, vomiting, diarrhea.  She notes that she has still been ambulatory at her baseline but notes that ambulation does make the pain worse.  She denies any associated fever, chills, chest pain or shortness of breath.  Patient notes that her current symptoms do not feel similar to her previous bouts of kidney infection.   Back Pain      Home Medications Prior to Admission medications   Medication Sig Start Date End Date Taking? Authorizing Provider  acyclovir (ZOVIRAX) 400 MG tablet TAKE 1 TABLET BY MOUTH FOUR TIMES A DAY FOR 5 DAYS 11/26/23   [name], MD  cephALEXin (KEFLEX) 500 MG capsule Take 1 capsule (500 mg total) by mouth 2 (two) times daily. 08/20/23   Mardella Layman, MD  cyclobenzaprine (FLEXERIL) 10 MG tablet Take 1 tablet by mouth before bed as needed for muscle spasm. Warning: May cause drowsiness. 08/20/23   Mardella Layman, MD  mupirocin ointment (BACTROBAN) 2 % Apply thin amount 2 or 3 times  per day to infected cold sore until healed 11/28/23   [name], MD      Allergies    Midodrine, Ambien [zolpidem], Doxycycline, Hydrocodone, Lexapro [escitalopram], Macrolides and ketolides, and Trazodone and nefazodone    Review of Systems   Review of Systems  Musculoskeletal:  Positive for back pain.    Physical Exam Updated Vital Signs BP 90/63 (BP Location: Right Arm)   Pulse 62   Temp 98 F (36.7 C) (Oral)   Resp 16   Ht 5\' 4"  (1.626 m)   Wt 72 kg   SpO2 100%   BMI 27.25 kg/m  Physical Exam Constitutional:      Appearance: Normal appearance.  HENT:     Head: Normocephalic and atraumatic.     Mouth/Throat:     Mouth: Mucous membranes are moist.  Eyes:     Extraocular Movements: Extraocular movements intact.     Conjunctiva/sclera: Conjunctivae normal.     Pupils: Pupils are equal, round, and reactive to light.  Cardiovascular:     Rate and Rhythm: Normal rate and regular rhythm.     Pulses: Normal pulses.     Heart sounds: Normal heart sounds.  Pulmonary:     Effort: Pulmonary effort is normal.     Breath sounds: Normal breath sounds.  Abdominal:     General: Abdomen is flat. Bowel sounds are normal. There is no distension.     Palpations: Abdomen is soft.  Tenderness: There is no abdominal tenderness. There is no right CVA tenderness, left CVA tenderness or guarding.  Musculoskeletal:        General: No swelling or deformity. Normal range of motion.     Cervical back: Normal range of motion and neck supple.     Right lower leg: No edema.     Left lower leg: No edema.     Comments: Tenderness palpation noted over the lower lumbar spine, no step-off or deformity, no overlying erythema or warmth, nontender to palpation over bilateral lower extremities, no lower extremity edema, DP and PT pulses 2+ distally  Skin:    General: Skin is warm and dry.  Neurological:     General: No focal deficit present.     Mental Status: She is alert and oriented to  person, place, and time. Mental status is at baseline.     Comments: Strength 5 out of 5 in bilateral upper and lower extremities, extension bilateral great toes intact, sensation equal in bilateral lower extremities, gait stable without assistance, intact deep tendon reflexes in bilateral lower extremities, full flexion-extension noted to bilateral lower extremities and at hip flexors and extensors  Psychiatric:        Mood and Affect: Mood normal.        Behavior: Behavior normal.        Thought Content: Thought content normal.        Judgment: Judgment normal.     ED Results / Procedures / Treatments   Labs (all labs ordered are listed, but only abnormal results are displayed) Labs Reviewed - No data to display  EKG None  Radiology No results found.  Procedures Procedures    Medications Ordered in ED Medications  diazepam (VALIUM) tablet 5 mg (has no administration in time range)    ED Course/ Medical Decision Making/ A&P Clinical Course as of 01/14/24 1703  Wed Jan 14, 2024  1644 DG Lumbar Spine Complete [CR]    Clinical Course User Index [CR] Lelon Perla, PA-C                                 Medical Decision Making Patient is doing well at this time and is stable for discharge home.  Discussed with patient that x-rays demonstrated underlying degenerative changes with no signs of underlying acute osseous injury or lesion.  Images were reviewed by myself as well.  Patient was unable to provide a urine while in the emergency department but adamantly denies any urinary complaints at this time.  Patient has no clinical indication Etiology such as cauda equina syndrome, vertebral myelitis, epidural abscess.  She is TUNAFISH negative and has no concerning neurological deficits.  Patient's abdominal exam is benign with no tenderness, distention, bruising throughout and do not suspect Acute surgical pathology such as acute appendicitis, cholecystitis, bowel suction,  diverticulitis, ovarian torsion or cyst, PID, to brain abscess, pyelonephritis, kidney stone, mesenteric ischemia.  Discussed with patient the importance of close follow-up with primary care doctor and orthopedics on an outpatient basis.  Will continue symptomatic treatment at this time as symptoms does appear to be secondary to muscle strain as she is tender to palpation directly over paraspinous muscles.  Strict turn precautions were provided for any new or worsening symptoms.  Patient voiced understanding to the plan and had no additional questions.  Amount and/or Complexity of Data Reviewed External Data Reviewed: radiology. Radiology: ordered. Decision-making details  documented in ED Course.  Risk Prescription drug management.           Final Clinical Impression(s) / ED Diagnoses Final diagnoses:  None    Rx / DC Orders ED Discharge Orders     None         Kathlen Mody 01/14/24 1705    Kommor, Wyn Forster, MD 01/15/24 1224

## 2024-01-14 NOTE — Discharge Instructions (Addendum)
Please follow-up closely with your primary care doctor on an outpatient basis for reevaluation.  Take all medications as directed.  Return to emergency department immediately for any new or worsening symptoms to include numbness or tingling in your lower extremities, urinating or defecating on yourself, pain that is not controlled at home.

## 2024-01-14 NOTE — ED Triage Notes (Signed)
C/o lower back pain x2 days that is worse with lifting legs and bending over.  Pt denies urinary s/sy Pt reports watching two grandchildren that are 50 lbs and she has carried them a lot lately.  Tylenol w/o relief

## 2024-01-21 ENCOUNTER — Other Ambulatory Visit: Payer: Self-pay

## 2024-01-21 ENCOUNTER — Encounter (HOSPITAL_COMMUNITY): Payer: Self-pay

## 2024-01-21 ENCOUNTER — Emergency Department (HOSPITAL_COMMUNITY)
Admission: EM | Admit: 2024-01-21 | Discharge: 2024-01-21 | Disposition: A | Discharge status: Home / Self Care | Payer: 59 | Attending: Emergency Medicine | Admitting: Emergency Medicine

## 2024-01-21 DIAGNOSIS — J101 Influenza due to other identified influenza virus with other respiratory manifestations: Secondary | ICD-10-CM | POA: Insufficient documentation

## 2024-01-21 DIAGNOSIS — R519 Headache, unspecified: Secondary | ICD-10-CM | POA: Diagnosis present

## 2024-01-21 DIAGNOSIS — Z20822 Contact with and (suspected) exposure to covid-19: Secondary | ICD-10-CM | POA: Insufficient documentation

## 2024-01-21 LAB — RESP PANEL BY RT-PCR (RSV, FLU A&B, COVID)  RVPGX2
Influenza A by PCR: POSITIVE — AB
Influenza B by PCR: NEGATIVE
Resp Syncytial Virus by PCR: NEGATIVE
SARS Coronavirus 2 by RT PCR: NEGATIVE

## 2024-01-21 LAB — GROUP A STREP BY PCR: Group A Strep by PCR: NOT DETECTED

## 2024-01-21 MED ORDER — ONDANSETRON 4 MG PO TBDP
4.0000 mg | ORAL_TABLET | Freq: Once | ORAL | Status: AC
  Administered 2024-01-21: 4 mg via ORAL
  Filled 2024-01-21: qty 1

## 2024-01-21 MED ORDER — ACETAMINOPHEN 325 MG PO TABS
650.0000 mg | ORAL_TABLET | Freq: Once | ORAL | Status: AC | PRN
  Administered 2024-01-21: 650 mg via ORAL
  Filled 2024-01-21: qty 2

## 2024-01-21 MED ORDER — ONDANSETRON 4 MG PO TBDP
4.0000 mg | ORAL_TABLET | Freq: Once | ORAL | Status: AC
Start: 2024-01-21 — End: 2024-01-21
  Administered 2024-01-21: 4 mg via ORAL
  Filled 2024-01-21: qty 1

## 2024-01-21 NOTE — Discharge Instructions (Addendum)
 Strep test was negative.  Respiratory panel positive for influenza A.  Would recommend at least Tylenol  every 6 hours for the fevers.  Rest increase fluids.  Can also consider over-the-counter cold and flu medicines.  Keep in mind that many of them have Tylenol  already included.  So if you are taken over-the-counter cold and flu medicine with Tylenol  you cannot take additional Tylenol  on top of it.

## 2024-01-21 NOTE — ED Triage Notes (Signed)
 Patient said she woke up not being able to swallow. Feels like strep throat. Feels nauseous and has a headache.

## 2024-01-21 NOTE — ED Provider Notes (Signed)
 [place] EMERGENCY DEPARTMENT AT [facility] Provider Note   CSN: [id number] Arrival date & time: 01/21/24  9062     History  Chief Complaint  Patient presents with   Sore Throat    Heather Moon is a 43 y.o. female.  Patient with acute onset of not feeling well that occurred in in the early hours of the morning.  Sore throat feeling nauseated headache body aches no distinct respiratory symptoms.  No vomiting or diarrhea.  Patient's primary care doctor is Dr. Dorice in [place].  Past medical history significant for migraines chronic kidney disease stage III.  Past surgical history of her for gallbladder removal.  Patient does not use tobacco products.  Temp here was 101.9.  They did give her Tylenol .  Oxygen saturation 100% respiration 16 blood pressure 111/74.  Patient felt fine yesterday.       Home Medications Prior to Admission medications   Medication Sig Start Date End Date Taking? Authorizing Provider  acyclovir  (ZOVIRAX ) 400 MG tablet TAKE 1 TABLET BY MOUTH FOUR TIMES A DAY FOR 5 DAYS 11/26/23   Alphonsa Glendia LABOR, MD  cephALEXin  (KEFLEX ) 500 MG capsule Take 1 capsule (500 mg total) by mouth 2 (two) times daily. 08/20/23   Rolinda Rogue, MD  cyclobenzaprine  (FLEXERIL ) 10 MG tablet Take 1 tablet by mouth before bed as needed for muscle spasm. Warning: May cause drowsiness. 08/20/23   Rolinda Rogue, MD  methocarbamol  (ROBAXIN ) 500 MG tablet Take 1 tablet (500 mg total) by mouth 2 (two) times daily. 01/14/24   Daralene Lonni BIRCH, PA-C  mupirocin  ointment (BACTROBAN ) 2 % Apply thin amount 2 or 3 times per day to infected cold sore until healed 11/28/23   Alphonsa Glendia LABOR, MD      Allergies    Midodrine , Ambien  [zolpidem ], Doxycycline, Hydrocodone , Lexapro  [escitalopram ], Macrolides and ketolides, and Trazodone and nefazodone    Review of Systems   Review of Systems  Constitutional:  Positive for fatigue and fever. Negative for chills.  HENT:  Positive for  sore throat. Negative for ear pain.   Eyes:  Negative for pain and visual disturbance.  Respiratory:  Negative for cough and shortness of breath.   Cardiovascular:  Negative for chest pain and palpitations.  Gastrointestinal:  Negative for abdominal pain and vomiting.  Genitourinary:  Negative for dysuria and hematuria.  Musculoskeletal:  Positive for myalgias. Negative for arthralgias and back pain.  Skin:  Negative for color change and rash.  Neurological:  Positive for headaches. Negative for seizures and syncope.  All other systems reviewed and are negative.   Physical Exam Updated Vital Signs BP 111/74 (BP Location: Left Arm)   Pulse (!) 102   Temp 99.6 F (37.6 C) (Oral)   Resp 16   Ht 1.626 m (5' 4)   Wt 77.1 kg   SpO2 100%   BMI 29.18 kg/m  Physical Exam Vitals and nursing note reviewed.  Constitutional:      General: She is not in acute distress.    Appearance: Normal appearance. She is well-developed.  HENT:     Head: Normocephalic and atraumatic.     Mouth/Throat:     Mouth: Mucous membranes are moist.     Pharynx: Posterior oropharyngeal erythema present. No oropharyngeal exudate.     Comments: Uvula midline. Eyes:     Conjunctiva/sclera: Conjunctivae normal.  Cardiovascular:     Rate and Rhythm: Normal rate and regular rhythm.     Heart sounds: No murmur heard. Pulmonary:  Effort: Pulmonary effort is normal. No respiratory distress.     Breath sounds: Normal breath sounds. No wheezing, rhonchi or rales.  Abdominal:     Palpations: Abdomen is soft.     Tenderness: There is no abdominal tenderness.  Musculoskeletal:        General: No swelling.     Cervical back: Neck supple. No rigidity.  Skin:    General: Skin is warm and dry.     Capillary Refill: Capillary refill takes less than 2 seconds.  Neurological:     General: No focal deficit present.     Mental Status: She is alert and oriented to person, place, and time.  Psychiatric:        Mood  and Affect: Mood normal.     ED Results / Procedures / Treatments   Labs (all labs ordered are listed, but only abnormal results are displayed) Labs Reviewed  RESP PANEL BY RT-PCR (RSV, FLU A&B, COVID)  RVPGX2 - Abnormal; Notable for the following components:      Result Value   Influenza A by PCR POSITIVE (*)    All other components within normal limits  GROUP A STREP BY PCR    EKG None  Radiology No results found.  Procedures Procedures    Medications Ordered in ED Medications  acetaminophen  (TYLENOL ) tablet 650 mg (650 mg Oral Given 01/21/24 0956)  ondansetron  (ZOFRAN -ODT) disintegrating tablet 4 mg (4 mg Oral Given 01/21/24 0956)  ondansetron  (ZOFRAN -ODT) disintegrating tablet 4 mg (4 mg Oral Given 01/21/24 1126)    ED Course/ Medical Decision Making/ A&P                                 Medical Decision Making Risk OTC drugs. Prescription drug management.   Patient's temperature has come down nicely with the Tylenol .  Now below 100.  Rapid strep negative respiratory panel positive for flu A.  Clinically that seems to be fitting.  Will treat her symptomatically.  Patient definitely will benefit from Tylenol  every 6 hours.  Patient states she does not need a work note. Final Clinical Impression(s) / ED Diagnoses Final diagnoses:  Influenza A    Rx / DC Orders ED Discharge Orders     None         Geraldene Hamilton, MD 01/21/24 1127

## 2024-01-21 NOTE — ED Provider Triage Note (Signed)
 Emergency Medicine Provider Triage Evaluation Note  Heather Moon , a 43 y.o. female  was evaluated in triage.  Pt complains of sore throat. Flu like sxs since this AM, including sore throat  Review of Systems  Positive: As above Negative: As above  Physical Exam  BP 111/74 (BP Location: Left Arm)   Pulse (!) 102   Temp (!) 101.9 F (38.8 C)   Resp 16   Ht 5' 4 (1.626 m)   Wt 77.1 kg   SpO2 100%   BMI 29.18 kg/m  Gen:   Awake, no distress   Resp:  Normal effort  MSK:   Moves extremities without difficulty  Other:    Medical Decision Making  Medically screening exam initiated at 9:53 AM.  Appropriate orders placed.  Saliha Salts was informed that the remainder of the evaluation will be completed by another provider, this initial triage assessment does not replace that evaluation, and the importance of remaining in the ED until their evaluation is complete.     Nivia Colon, PA-C 01/21/24 [phone]

## 2024-01-26 DIAGNOSIS — M545 Low back pain, unspecified: Secondary | ICD-10-CM | POA: Diagnosis not present

## 2024-04-21 ENCOUNTER — Encounter: Payer: Self-pay | Admitting: Nurse Practitioner

## 2024-05-21 ENCOUNTER — Encounter (HOSPITAL_COMMUNITY): Payer: Self-pay | Admitting: *Deleted

## 2024-05-21 ENCOUNTER — Ambulatory Visit (HOSPITAL_COMMUNITY)
Admission: EM | Admit: 2024-05-21 | Discharge: 2024-05-21 | Disposition: A | Discharge status: Home / Self Care | Attending: Emergency Medicine | Admitting: Emergency Medicine

## 2024-05-21 ENCOUNTER — Other Ambulatory Visit: Payer: Self-pay

## 2024-05-21 DIAGNOSIS — S2096XA Insect bite (nonvenomous) of unspecified parts of thorax, initial encounter: Secondary | ICD-10-CM | POA: Diagnosis not present

## 2024-05-21 DIAGNOSIS — R350 Frequency of micturition: Secondary | ICD-10-CM | POA: Diagnosis not present

## 2024-05-21 DIAGNOSIS — W57XXXA Bitten or stung by nonvenomous insect and other nonvenomous arthropods, initial encounter: Secondary | ICD-10-CM | POA: Diagnosis not present

## 2024-05-21 DIAGNOSIS — S30860A Insect bite (nonvenomous) of lower back and pelvis, initial encounter: Secondary | ICD-10-CM | POA: Insufficient documentation

## 2024-05-21 LAB — POCT URINALYSIS DIP (MANUAL ENTRY)
Bilirubin, UA: NEGATIVE
Blood, UA: NEGATIVE
Glucose, UA: NEGATIVE mg/dL
Ketones, POC UA: NEGATIVE mg/dL
Nitrite, UA: NEGATIVE
Protein Ur, POC: NEGATIVE mg/dL
Spec Grav, UA: 1.015 (ref 1.010–1.025)
Urobilinogen, UA: 0.2 U/dL
pH, UA: 7 (ref 5.0–8.0)

## 2024-05-21 MED ORDER — AMOXICILLIN 500 MG PO CAPS: 500.0000 mg | ORAL_CAPSULE | Freq: Three times a day (TID) | ORAL | 0 refills | Status: AC

## 2024-05-21 NOTE — Discharge Instructions (Signed)
 Start taking amoxicillin  3 times daily for 14 days for prophylaxis of Lyme disease related to tick bite. I have sent a urine culture to confirm if there is any presence of a urinary tract infection.  If these results are positive someone will call and adjust your treatment plan if needed. Follow-up with primary care provider or return here as needed.

## 2024-05-21 NOTE — ED Provider Notes (Signed)
 [facility]    CSN: [id number] Arrival date & time: 05/21/24  1028      History   Chief Complaint Chief Complaint  Patient presents with   UTI   Skin Problem    HPI Heather Moon is a 43 y.o. female.   Patient presents with concerns for tick bite.  Patient states that she removed a tick from her left chest wall 2 days ago and surrounding area is red and mildly swollen.  Patient states that she also has a similar area to her right low back as well where she also removed a tick.  Patient also endorses some urinary frequency.  Denies dysuria, hematuria, flank pain, abdominal pain, fever, nausea, vomiting, and lethargy.  The history is provided by the patient and medical records.    Past Medical History:  Diagnosis Date   Blood transfusion without reported diagnosis    Cholelithiasis    Chronic insomnia    Constipation, chronic    GERD (gastroesophageal reflux disease)    takes Prilosec as needed   Kidney disease    stage 3 per pt   Kidney infection    Migraine    Migraine headache    Renal disorder    stent to L kidney since 43 yo   UTI (lower urinary tract infection)     Patient Active Problem List   Diagnosis Date Noted   Stage 3b chronic kidney disease (CKD) (HCC) 03/10/2023   Acute pyelonephritis 02/24/2023   AKI (acute kidney injury) (HCC) 02/24/2023   Sepsis (HCC) 02/24/2023   Sepsis secondary to UTI (HCC) 02/24/2023   Gastroesophageal reflux disease without esophagitis 05/10/2022   Atrophic kidney 04/25/2022   Acute cystitis without hematuria 04/25/2022   Mixed stress and urge urinary incontinence 01/12/2018   Chronic pain of right knee 12/13/2016   Chronic constipation 11/18/2016   Anxiety and depression 09/20/2016   History of hysterectomy, supracervical 09/19/2016   Insomnia 10/20/2015   Herniated cervical disc 03/24/2013   Migraine headache without aura 03/05/2013    Past Surgical History:  Procedure Laterality Date   ABDOMINAL  HYSTERECTOMY  08/27/2010   supracervical, blood transfusion   CHOLECYSTECTOMY  01/11/2013   Procedure: LAPAROSCOPIC CHOLECYSTECTOMY;  Surgeon: Rogena Class, MD;  Location:  [facility];  Service: General;  Laterality: N/A;  Laparoscopic cholecystectomy   EYE SURGERY Left    at 6mos old   RENAL ARTERY STENT      OB History   No obstetric history on file.      Home Medications    Prior to Admission medications   Medication Sig Start Date End Date Taking? Authorizing Provider  amoxicillin  (AMOXIL ) 500 MG capsule Take 1 capsule (500 mg total) by mouth 3 (three) times daily for 14 days. 05/21/24 06/04/24 Yes Karon Packer, NP    Family History Family History  Problem Relation Age of Onset   Diabetes Mother    Cancer Father    Cancer Maternal Grandmother    Cirrhosis Maternal Grandfather     Social History Social History   Tobacco Use   Smoking status: Never   Smokeless tobacco: Never  Vaping Use   Vaping status: Every Day   Substances: Nicotine , Nicotine -salt  Substance Use Topics   Alcohol use: No   Drug use: No     Allergies   Midodrine , Ambien  [zolpidem ], Doxycycline, Hydrocodone , Lexapro  [escitalopram ], Macrolides and ketolides, and Trazodone and nefazodone   Review of Systems Review of Systems  Per HPI  Physical Exam Triage Vital Signs ED Triage Vitals  Encounter Vitals Group     BP 05/21/24 1130 108/67     Systolic BP Percentile --      Diastolic BP Percentile --      Pulse Rate 05/21/24 1130 63     Resp 05/21/24 1130 20     Temp 05/21/24 1130 98 F (36.7 C)     Temp src --      SpO2 05/21/24 1130 98 %     Weight --      Height --      Head Circumference --      Peak Flow --      Pain Score 05/21/24 1226 0     Pain Loc --      Pain Education --      Exclude from Growth Chart --    No data found.  Updated Vital Signs BP 108/67   Pulse 63   Temp 98 F (36.7 C)   Resp 20   SpO2 98%   Visual Acuity Right Eye  Distance:   Left Eye Distance:   Bilateral Distance:    Right Eye Near:   Left Eye Near:    Bilateral Near:     Physical Exam Vitals and nursing note reviewed.  Constitutional:      General: She is awake. She is not in acute distress.    Appearance: Normal appearance. She is well-developed and well-groomed. She is not ill-appearing.  Abdominal:     Tenderness: There is no right CVA tenderness or left CVA tenderness.  Skin:    General: Skin is warm and dry.     Findings: Erythema present.     Comments: Erythematous patches noted to lateral thoracic region and right low back as pictured below located where ticks were removed.  Neurological:     Mental Status: She is alert.  Psychiatric:        Behavior: Behavior is cooperative.         UC Treatments / Results  Labs (all labs ordered are listed, but only abnormal results are displayed) Labs Reviewed  POCT URINALYSIS DIP (MANUAL ENTRY) - Abnormal; Notable for the following components:      Result Value   Leukocytes, UA Trace (*)    All other components within normal limits  URINE CULTURE    EKG   Radiology No results found.  Procedures Procedures (including critical care time)  Medications Ordered in UC Medications - No data to display  Initial Impression / Assessment and Plan / UC Course  I have reviewed the triage vital signs and the nursing notes.  Pertinent labs & imaging results that were available during my care of the patient were reviewed by me and considered in my medical decision making (see chart for details).     Patient is well-appearing.  Vitals are stable.  Upon assessment there are 2 erythematous patches 1 noted to the lateral thoracic region and the other noted to the right low back as pictured above.  Patient reports ticks removed from both of these areas.  Urinalysis reveals trace leukocytes, will send urine culture to confirm presence of urinary tract infection.  Patient has allergy to  doxycycline.  Treating with amoxicillin  prophylactically for Lyme disease.  Discussed follow-up and return precautions. Final Clinical Impressions(s) / UC Diagnoses   Final diagnoses:  Tick bite of thoracic wall, unspecified whether front or back, initial encounter  Tick bite of lower back, initial encounter  Urinary  frequency     Discharge Instructions      Start taking amoxicillin  3 times daily for 14 days for prophylaxis of Lyme disease related to tick bite. I have sent a urine culture to confirm if there is any presence of a urinary tract infection.  If these results are positive someone will call and adjust your treatment plan if needed. Follow-up with primary care provider or return here as needed.  ED Prescriptions     Medication Sig Dispense Auth. Provider   amoxicillin  (AMOXIL ) 500 MG capsule Take 1 capsule (500 mg total) by mouth 3 (three) times daily for 14 days. 42 capsule Levora Reas A, NP      PDMP not reviewed this encounter.   Levora Reas A, NP 05/21/24 1415

## 2024-05-21 NOTE — ED Triage Notes (Signed)
 Pt reports fequent urination and back pain. Pt also picked a tick off her Lt chest wall 2 days ago and now has a area that is red /swollen. Same problem on RT chest wall.

## 2024-05-23 LAB — URINE CULTURE

## 2024-05-24 ENCOUNTER — Ambulatory Visit (HOSPITAL_COMMUNITY): Payer: Self-pay

## 2024-06-01 ENCOUNTER — Telehealth: Payer: Self-pay | Admitting: *Deleted

## 2024-06-01 ENCOUNTER — Encounter: Payer: Self-pay | Admitting: Family Medicine

## 2024-06-01 NOTE — Progress Notes (Signed)
 Please print for patient if you need me to sign it come get me Please have the patient read over it

## 2024-06-01 NOTE — Telephone Encounter (Signed)
 Copied from CRM [id number]. Topic: General - Other >> Jun 01, 2024 11:24 AM Lotus Round B wrote: Reason for CRM: pt called in to see if she can get a letter stating that she doesn't have weight restrictions . That she is able to lift up to 50 pounds . She would like to see if he can email it . My chart it or she would have to pick it up at office and please give her a call . She is starting a new job on Friday so she is seeing if she can get the note before then.

## 2024-06-01 NOTE — Telephone Encounter (Signed)
 A letter was requested by the patient According to what was given to the nurse to clear her for work and say that she can lift 50 pounds without difficulty I did write this letter.  Please print it-if you need me to sign it let me sign it Come find me I would advise the patient to read over this if it needs to be revised let me know while she is here thank you

## 2024-10-17 ENCOUNTER — Encounter (HOSPITAL_COMMUNITY): Payer: Self-pay

## 2024-10-17 ENCOUNTER — Ambulatory Visit (HOSPITAL_COMMUNITY)
Admission: EM | Admit: 2024-10-17 | Discharge: 2024-10-17 | Disposition: A | Discharge status: Home / Self Care | Attending: Emergency Medicine | Admitting: Emergency Medicine

## 2024-10-17 DIAGNOSIS — Z905 Acquired absence of kidney: Secondary | ICD-10-CM | POA: Diagnosis not present

## 2024-10-17 DIAGNOSIS — R300 Dysuria: Secondary | ICD-10-CM

## 2024-10-17 LAB — POCT URINE DIPSTICK
Bilirubin, UA: NEGATIVE
Blood, UA: NEGATIVE
Glucose, UA: NEGATIVE mg/dL
Ketones, POC UA: NEGATIVE mg/dL
Nitrite, UA: NEGATIVE
POC PROTEIN,UA: NEGATIVE
Spec Grav, UA: 1.01 (ref 1.010–1.025)
Urobilinogen, UA: 0.2 U/dL
pH, UA: 5.5 (ref 5.0–8.0)

## 2024-10-17 MED ORDER — CEFIXIME 400 MG PO CAPS: 400.0000 mg | ORAL_CAPSULE | Freq: Every day | ORAL | 0 refills | Status: AC

## 2024-10-17 NOTE — Discharge Instructions (Signed)
 Common causes of urinary tract infections include but are not limited to holding your urine longer than you should, squatting instead of sitting down when urinating, sitting around in wet clothing such as a wet swimsuit or gym clothes too long, not emptying your bladder after having sexual intercourse, wiping from back to front instead of front to back after having a bowel movement.     The urinalysis that we performed in the clinic today was abnormal.  Urine culture will be performed per our protocol.  The result of the urine culture will be available in the next 3 to 5 days and will be posted to your MyChart account.  If there is an abnormal finding, you will be contacted by phone and advised of further treatment recommendations, if any.   You were advised to begin antibiotics today because your urinalysis is abnormal and you are having active symptoms of an acute lower urinary tract infection also known as cystitis.     Please pick up and begin taking your prescription for Suprax (cefixime) as soon as possible.  Please take all doses exactly as prescribed.  You can take this medication with or without food.  This medication is safe to take with your other medications.   If you have not had complete resolution of your symptoms after completing treatment as prescribed, please return to urgent care for repeat evaluation or follow-up with your primary care provider.  Repeat urinalysis and urine culture may be indicated for more directed therapy.   Thank you for visiting [facility] today.  We appreciate the opportunity to participate in your care.

## 2024-10-17 NOTE — ED Triage Notes (Addendum)
 Patient reports that she has had dysuria, urinary frequency, and bilateral low back pain x 5 days. Patient states she can only take Amoxicillin  Clav. patient reports that she only has one kidney and has several episodes of Sepsis from UTI.

## 2024-10-17 NOTE — ED Provider Notes (Signed)
 [facility]    CSN: [id number] Arrival date & time: 10/17/24  1537    HISTORY  No chief complaint on file.  HPI Heather Moon is a pleasant, 43 y.o. female who presents to urgent care today. Patient complains of burning with urination, increased frequency of urination and bilateral low back pain for the past 5 days.  Patient states the low back pain is chronic.  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  Patient reports allergies and intolerances to multiple antibiotics, states she only has 1 kidney and that the only antibiotic she can take it is Augmentin .  Patient also states she has had several episodes of sepsis as a result of having a UTI.  EMR reviewed by me, most recent episode of sepsis was in March 2024, patient had a urinary tract infection at the time, culture revealed E. coli resistant to ampicillin and ampicillin sulbactam.  Patient has normal vital signs on arrival today and appears otherwise well.  Of note, urine dip revealed moderate leukocytes and had a cloudy appearance.  Patient denies abnormal vaginal discharge, vaginal itching, vaginal irritation, incontinence of urine, fever , body aches, chills, rigors, malaise, and significant fatigue.   The history is provided by the patient.    Past Medical History:  Diagnosis Date   Blood transfusion without reported diagnosis    Cholelithiasis    Chronic insomnia    Constipation, chronic    GERD (gastroesophageal reflux disease)    takes Prilosec as needed   Kidney disease    stage 3 per pt   Kidney infection    Migraine    Migraine headache    Renal disorder    stent to L kidney since 43 yo   UTI (lower urinary tract infection)    Patient Active Problem List   Diagnosis Date Noted   Stage 3b chronic kidney disease (CKD) (HCC) 03/10/2023   Acute pyelonephritis 02/24/2023   AKI (acute kidney injury) 02/24/2023   Sepsis (HCC) 02/24/2023   Sepsis secondary to UTI  (HCC) 02/24/2023   Gastroesophageal reflux disease without esophagitis 05/10/2022   Atrophic kidney 04/25/2022   Acute cystitis without hematuria 04/25/2022   Mixed stress and urge urinary incontinence 01/12/2018   Chronic pain of right knee 12/13/2016   Chronic constipation 11/18/2016   Anxiety and depression 09/20/2016   History of hysterectomy, supracervical 09/19/2016   Insomnia 10/20/2015   Herniated cervical disc 03/24/2013   Migraine headache without aura 03/05/2013   Past Surgical History:  Procedure Laterality Date   ABDOMINAL HYSTERECTOMY  08/27/2010   supracervical, blood transfusion   CHOLECYSTECTOMY  01/11/2013   Procedure: LAPAROSCOPIC CHOLECYSTECTOMY;  Surgeon: Vicenta DELENA Poli, MD;  Location: [facility];  Service: General;  Laterality: N/A;  Laparoscopic cholecystectomy   EYE SURGERY Left    at 6mos old   RENAL ARTERY STENT     OB History   No obstetric history on file.    Home Medications    Prior to Admission medications   Not on File    Family History Family History  Problem Relation Age of Onset   Diabetes Mother    Cancer Father    Cancer Maternal Grandmother    Cirrhosis Maternal Grandfather    Social History Social History   Tobacco Use   Smoking status: Never   Smokeless tobacco: Never  Vaping Use   Vaping status: Every Day   Substances: Nicotine , Nicotine -salt  Substance Use Topics  Alcohol use: No   Drug use: No   Allergies   Midodrine , Ambien  [zolpidem ], Doxycycline, Hydrocodone , Lexapro  [escitalopram ], Macrolides and ketolides, and Trazodone and nefazodone  Review of Systems Review of Systems Pertinent findings revealed after performing a 14 point review of systems has been noted in the history of present illness.  Physical Exam Vital Signs BP 103/63 (BP Location: Left Arm)   Pulse (!) 58   Temp 98.7 F (37.1 C) (Oral)   Resp 16   SpO2 95%   No data found.  Physical Exam Vitals and nursing note  reviewed.  Constitutional:      General: She is not in acute distress.    Appearance: Normal appearance. She is not ill-appearing.  HENT:     Head: Normocephalic and atraumatic.  Eyes:     General: Lids are normal.        Right eye: No discharge.        Left eye: No discharge.     Conjunctiva/sclera: Conjunctivae normal.     Right eye: Right conjunctiva is not injected.     Left eye: Left conjunctiva is not injected.  Neck:     Trachea: Trachea and phonation normal.  Cardiovascular:     Rate and Rhythm: Normal rate and regular rhythm.  Pulmonary:     Effort: Pulmonary effort is normal.  Abdominal:     General: Abdomen is flat. Bowel sounds are normal. There is no distension.     Palpations: Abdomen is soft.     Tenderness: There is no abdominal tenderness. There is no right CVA tenderness or left CVA tenderness.     Hernia: No hernia is present.  Musculoskeletal:        General: Normal range of motion.     Cervical back: Full passive range of motion without pain, normal range of motion and neck supple. Normal range of motion.  Skin:    General: Skin is warm and dry.     Findings: No erythema or rash.  Neurological:     General: No focal deficit present.     Mental Status: She is alert and oriented to person, place, and time. Mental status is at baseline.  Psychiatric:        Attention and Perception: Attention and perception normal.        Mood and Affect: Mood and affect normal.        Speech: Speech normal.        Behavior: Behavior normal.     Visual Acuity Right Eye Distance:   Left Eye Distance:   Bilateral Distance:    Right Eye Near:   Left Eye Near:    Bilateral Near:     UC Couse / Diagnostics / Procedures:     Radiology No results found.  Procedures Procedures (including critical care time) EKG  Pending results:  Labs Reviewed  POCT URINE DIPSTICK - Abnormal; Notable for the following components:      Result Value   Clarity, UA cloudy (*)     Leukocytes, UA Moderate (2+) (*)    All other components within normal limits  URINE CULTURE    Medications Ordered in UC: Medications - No data to display  UC Diagnoses / Final Clinical Impressions(s)   I have reviewed the triage vital signs and the nursing notes.  Pertinent labs & imaging results that were available during my care of the patient were reviewed by me and considered in my medical decision making (see chart for details).  Final diagnoses:  Dysuria  Single kidney   Patient was advised to begin antibiotics now due to findings on urine dip. Patient was advised to begin antibiotics today due to having active symptoms of urinary tract infection.                    Patient was advised to take all doses exactly as prescribed.  Patient also advised of risks of worsening infection with incomplete antibiotic therapy. Patient advised that they will be contacted with results and that adjustments to treatment will be provided as indicated based on the results.   Patient was advised of possibility that urine culture results may be negative if sample provided was obtained late in the day causing urine to be more diluted.  Patient was advised that if antibiotics were effective after the first 24 to 36 hours, despite negative urine culture result, it is recommended that they complete the full course as prescribed.   Return precautions advised.  Please see discharge instructions below for details of plan of care as provided to patient. ED Prescriptions     Medication Sig Dispense Auth. Provider   cefixime (SUPRAX) 400 MG CAPS capsule Take 1 capsule (400 mg total) by mouth daily for 7 days. 7 capsule Joesph Shaver Scales, PA-C      PDMP not reviewed this encounter.  Disposition Upon Discharge:  Condition: stable for discharge home  Patient presented with concern for an acute illness with associated systemic symptoms and significant discomfort requiring urgent management. In my  opinion, this is a condition that a prudent lay person (someone who possesses an average knowledge of health and medicine) may potentially expect to result in complications if not addressed urgently such as respiratory distress, impairment of bodily function or dysfunction of bodily organs.   As such, the patient has been evaluated and assessed, work-up was performed and treatment was provided in alignment with urgent care protocols and evidence based medicine.  Patient/parent/caregiver has been advised that the patient may require follow up for further testing and/or treatment if the symptoms continue in spite of treatment, as clinically indicated and appropriate.  Routine symptom specific, illness specific and/or disease specific instructions were discussed with the patient and/or caregiver at length.  Prevention strategies for avoiding STD exposure were also discussed.  The patient will follow up with their current PCP if and as advised. If the patient does not currently have a PCP we will assist them in obtaining one.   The patient may need specialty follow up if the symptoms continue, in spite of conservative treatment and management, for further workup, evaluation, consultation and treatment as clinically indicated and appropriate.  Patient/parent/caregiver verbalized understanding and agreement of plan as discussed.  All questions were addressed during visit.  Please see discharge instructions below for further details of plan.    Discharge Instructions      Common causes of urinary tract infections include but are not limited to holding your urine longer than you should, squatting instead of sitting down when urinating, sitting around in wet clothing such as a wet swimsuit or gym clothes too long, not emptying your bladder after having sexual intercourse, wiping from back to front instead of front to back after having a bowel movement.     The urinalysis that we performed in the clinic  today was abnormal.  Urine culture will be performed per our protocol.  The result of the urine culture will be available in the next 3 to 5  days and will be posted to your MyChart account.  If there is an abnormal finding, you will be contacted by phone and advised of further treatment recommendations, if any.   You were advised to begin antibiotics today because your urinalysis is abnormal and you are having active symptoms of an acute lower urinary tract infection also known as cystitis.     Please pick up and begin taking your prescription for Suprax (cefixime) as soon as possible.  Please take all doses exactly as prescribed.  You can take this medication with or without food.  This medication is safe to take with your other medications.   If you have not had complete resolution of your symptoms after completing treatment as prescribed, please return to urgent care for repeat evaluation or follow-up with your primary care provider.  Repeat urinalysis and urine culture may be indicated for more directed therapy.   Thank you for visiting [facility] today.  We appreciate the opportunity to participate in your care.       This office note has been dictated using Teaching laboratory technician.  Unfortunately, this method of dictation can sometimes lead to typographical or grammatical errors.  I apologize for your inconvenience in advance if this occurs.  Please do not hesitate to reach out to me if clarification is needed.       Joesph Shaver Scales, [place] 10/17/24 [phone]

## 2024-10-18 LAB — URINE CULTURE: Culture: <10000 — AB

## 2024-10-19 ENCOUNTER — Ambulatory Visit (HOSPITAL_COMMUNITY): Payer: Self-pay
